# Patient Record
Sex: Male | Born: 1945 | Race: White | Hispanic: No | Marital: Married | Smoking: Never smoker
Health system: Southern US, Community
[De-identification: ages and names within clinical notes are randomized; demographics above are authoritative.]

## PROBLEM LIST (undated history)

## (undated) DIAGNOSIS — R600 Localized edema: Secondary | ICD-10-CM

## (undated) DIAGNOSIS — C189 Malignant neoplasm of colon, unspecified: Secondary | ICD-10-CM

## (undated) DIAGNOSIS — G4730 Sleep apnea, unspecified: Secondary | ICD-10-CM

## (undated) DIAGNOSIS — Z9889 Other specified postprocedural states: Secondary | ICD-10-CM

## (undated) DIAGNOSIS — Z87442 Personal history of urinary calculi: Secondary | ICD-10-CM

## (undated) DIAGNOSIS — E785 Hyperlipidemia, unspecified: Secondary | ICD-10-CM

## (undated) DIAGNOSIS — R351 Nocturia: Principal | ICD-10-CM

## (undated) DIAGNOSIS — T8859XA Other complications of anesthesia, initial encounter: Secondary | ICD-10-CM

## (undated) DIAGNOSIS — G629 Polyneuropathy, unspecified: Secondary | ICD-10-CM

## (undated) DIAGNOSIS — R002 Palpitations: Secondary | ICD-10-CM

## (undated) DIAGNOSIS — T4145XA Adverse effect of unspecified anesthetic, initial encounter: Secondary | ICD-10-CM

## (undated) DIAGNOSIS — I10 Essential (primary) hypertension: Secondary | ICD-10-CM

## (undated) DIAGNOSIS — M1990 Unspecified osteoarthritis, unspecified site: Secondary | ICD-10-CM

## (undated) DIAGNOSIS — F419 Anxiety disorder, unspecified: Secondary | ICD-10-CM

## (undated) DIAGNOSIS — N401 Enlarged prostate with lower urinary tract symptoms: Secondary | ICD-10-CM

## (undated) HISTORY — DX: Sleep apnea, unspecified: G47.30

## (undated) HISTORY — DX: Other specified postprocedural states: Z98.890

## (undated) HISTORY — DX: Malignant neoplasm of colon, unspecified: C18.9

## (undated) HISTORY — PX: COLONOSCOPY: SHX174

## (undated) HISTORY — PX: JOINT REPLACEMENT: SHX530

## (undated) HISTORY — PX: TRANSURETHRAL RESECTION OF PROSTATE: SHX73

## (undated) HISTORY — DX: Hyperlipidemia, unspecified: E78.5

## (undated) HISTORY — PX: COLON SURGERY: SHX602

## (undated) HISTORY — DX: Polyneuropathy, unspecified: G62.9

## (undated) HISTORY — DX: Palpitations: R00.2

## (undated) HISTORY — DX: Benign prostatic hyperplasia with lower urinary tract symptoms: N40.1

## (undated) HISTORY — DX: Anxiety disorder, unspecified: F41.9

## (undated) HISTORY — DX: Essential (primary) hypertension: I10

## (undated) HISTORY — PX: FOOT SURGERY: SHX648

## (undated) HISTORY — DX: Nocturia: R35.1

## (undated) HISTORY — PX: OTHER SURGICAL HISTORY: SHX169

---
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
## 1996-09-27 HISTORY — PX: RETINAL DETACHMENT SURGERY: SHX105

## 1997-07-04 HISTORY — PX: CARDIAC CATHETERIZATION: SHX172

## 2002-09-27 HISTORY — PX: COLON RESECTION: SHX5231

## 2003-05-10 ENCOUNTER — Encounter (INDEPENDENT_AMBULATORY_CARE_PROVIDER_SITE_OTHER): Payer: Self-pay | Admitting: Specialist

## 2003-05-10 ENCOUNTER — Ambulatory Visit (HOSPITAL_COMMUNITY): Admission: RE | Admit: 2003-05-10 | Discharge: 2003-05-10 | Payer: Self-pay | Admitting: Gastroenterology

## 2003-05-16 ENCOUNTER — Encounter: Payer: Self-pay | Admitting: Surgery

## 2003-05-16 ENCOUNTER — Encounter: Admission: RE | Admit: 2003-05-16 | Discharge: 2003-05-16 | Payer: Self-pay | Admitting: Surgery

## 2003-05-28 ENCOUNTER — Encounter: Payer: Self-pay | Admitting: Surgery

## 2003-06-04 ENCOUNTER — Inpatient Hospital Stay (HOSPITAL_COMMUNITY): Admission: RE | Admit: 2003-06-04 | Discharge: 2003-06-11 | Payer: Self-pay | Admitting: Surgery

## 2003-06-04 ENCOUNTER — Encounter: Payer: Self-pay | Admitting: Surgery

## 2003-06-04 ENCOUNTER — Encounter (INDEPENDENT_AMBULATORY_CARE_PROVIDER_SITE_OTHER): Payer: Self-pay | Admitting: Specialist

## 2003-07-01 ENCOUNTER — Ambulatory Visit (HOSPITAL_COMMUNITY): Admission: RE | Admit: 2003-07-01 | Discharge: 2003-07-01 | Payer: Self-pay | Admitting: Surgery

## 2003-07-01 ENCOUNTER — Ambulatory Visit (HOSPITAL_BASED_OUTPATIENT_CLINIC_OR_DEPARTMENT_OTHER): Admission: RE | Admit: 2003-07-01 | Discharge: 2003-07-01 | Payer: Self-pay | Admitting: Surgery

## 2003-07-01 ENCOUNTER — Encounter: Payer: Self-pay | Admitting: Surgery

## 2004-05-05 ENCOUNTER — Ambulatory Visit (HOSPITAL_COMMUNITY): Admission: RE | Admit: 2004-05-05 | Discharge: 2004-05-05 | Payer: Self-pay | Admitting: Oncology

## 2004-07-03 ENCOUNTER — Ambulatory Visit (HOSPITAL_COMMUNITY): Admission: RE | Admit: 2004-07-03 | Discharge: 2004-07-03 | Payer: Self-pay | Admitting: Surgery

## 2004-07-03 ENCOUNTER — Ambulatory Visit (HOSPITAL_BASED_OUTPATIENT_CLINIC_OR_DEPARTMENT_OTHER): Admission: RE | Admit: 2004-07-03 | Discharge: 2004-07-03 | Payer: Self-pay | Admitting: Surgery

## 2004-08-07 ENCOUNTER — Ambulatory Visit: Payer: Self-pay | Admitting: Oncology

## 2004-12-14 ENCOUNTER — Ambulatory Visit (HOSPITAL_COMMUNITY): Admission: RE | Admit: 2004-12-14 | Discharge: 2004-12-14 | Payer: Self-pay | Admitting: Oncology

## 2004-12-14 ENCOUNTER — Ambulatory Visit: Payer: Self-pay | Admitting: Oncology

## 2005-05-03 ENCOUNTER — Encounter: Admission: RE | Admit: 2005-05-03 | Discharge: 2005-05-03 | Payer: Self-pay | Admitting: Surgery

## 2005-07-08 ENCOUNTER — Ambulatory Visit: Payer: Self-pay | Admitting: Oncology

## 2005-07-19 ENCOUNTER — Ambulatory Visit (HOSPITAL_COMMUNITY): Admission: RE | Admit: 2005-07-19 | Discharge: 2005-07-19 | Payer: Self-pay | Admitting: Oncology

## 2006-01-06 ENCOUNTER — Ambulatory Visit: Payer: Self-pay | Admitting: Oncology

## 2006-01-07 LAB — CBC WITH DIFFERENTIAL/PLATELET
Basophils Absolute: 0 10*3/uL (ref 0.0–0.1)
EOS%: 2 % (ref 0.0–7.0)
Eosinophils Absolute: 0.1 10*3/uL (ref 0.0–0.5)
HGB: 15.6 g/dL (ref 13.0–17.1)
MCH: 29.4 pg (ref 28.0–33.4)
MCV: 86.2 fL (ref 81.6–98.0)
MONO%: 9.2 % (ref 0.0–13.0)
NEUT#: 2.7 10*3/uL (ref 1.5–6.5)
RBC: 5.3 10*6/uL (ref 4.20–5.71)
RDW: 13.1 % (ref 11.2–14.6)
lymph#: 1.2 10*3/uL (ref 0.9–3.3)

## 2006-01-07 LAB — COMPREHENSIVE METABOLIC PANEL
AST: 24 U/L (ref 0–37)
Albumin: 4.7 g/dL (ref 3.5–5.2)
Alkaline Phosphatase: 88 U/L (ref 39–117)
BUN: 21 mg/dL (ref 6–23)
Calcium: 9.8 mg/dL (ref 8.4–10.5)
Chloride: 102 mEq/L (ref 96–112)
Potassium: 4.1 mEq/L (ref 3.5–5.3)
Sodium: 142 mEq/L (ref 135–145)
Total Protein: 7.1 g/dL (ref 6.0–8.3)

## 2006-07-18 ENCOUNTER — Ambulatory Visit: Payer: Self-pay | Admitting: Oncology

## 2006-07-26 ENCOUNTER — Ambulatory Visit (HOSPITAL_COMMUNITY): Admission: RE | Admit: 2006-07-26 | Discharge: 2006-07-26 | Payer: Self-pay | Admitting: Oncology

## 2006-07-26 LAB — LIPID PANEL
LDL Cholesterol: 95 mg/dL (ref 0–99)
Total CHOL/HDL Ratio: 3.8 Ratio
VLDL: 39 mg/dL (ref 0–40)

## 2006-07-26 LAB — CBC WITH DIFFERENTIAL/PLATELET
BASO%: 0.3 % (ref 0.0–2.0)
Basophils Absolute: 0 10*3/uL (ref 0.0–0.1)
EOS%: 2.1 % (ref 0.0–7.0)
HCT: 45.6 % (ref 38.7–49.9)
HGB: 15.7 g/dL (ref 13.0–17.1)
LYMPH%: 28 % (ref 14.0–48.0)
MCH: 29.7 pg (ref 28.0–33.4)
MCHC: 34.5 g/dL (ref 32.0–35.9)
MCV: 86.1 fL (ref 81.6–98.0)
MONO%: 9.4 % (ref 0.0–13.0)
NEUT%: 60.2 % (ref 40.0–75.0)

## 2006-07-26 LAB — COMPREHENSIVE METABOLIC PANEL
AST: 25 U/L (ref 0–37)
Alkaline Phosphatase: 91 U/L (ref 39–117)
BUN: 16 mg/dL (ref 6–23)
Calcium: 9.3 mg/dL (ref 8.4–10.5)
Chloride: 102 mEq/L (ref 96–112)
Creatinine, Ser: 0.98 mg/dL (ref 0.40–1.50)
Total Bilirubin: 0.5 mg/dL (ref 0.3–1.2)

## 2007-01-30 ENCOUNTER — Ambulatory Visit: Payer: Self-pay | Admitting: Oncology

## 2007-02-01 LAB — COMPREHENSIVE METABOLIC PANEL
ALT: 27 U/L (ref 0–53)
Albumin: 4.4 g/dL (ref 3.5–5.2)
CO2: 26 mEq/L (ref 19–32)
Calcium: 9.8 mg/dL (ref 8.4–10.5)
Chloride: 102 mEq/L (ref 96–112)
Glucose, Bld: 153 mg/dL — ABNORMAL HIGH (ref 70–99)
Potassium: 3.9 mEq/L (ref 3.5–5.3)
Sodium: 140 mEq/L (ref 135–145)
Total Protein: 6.5 g/dL (ref 6.0–8.3)

## 2007-02-01 LAB — CBC WITH DIFFERENTIAL/PLATELET
BASO%: 0.3 % (ref 0.0–2.0)
EOS%: 2.5 % (ref 0.0–7.0)
LYMPH%: 31.5 % (ref 14.0–48.0)
MCH: 29.6 pg (ref 28.0–33.4)
MCHC: 35.2 g/dL (ref 32.0–35.9)
MCV: 84.2 fL (ref 81.6–98.0)
MONO%: 10 % (ref 0.0–13.0)
NEUT#: 1.8 10*3/uL (ref 1.5–6.5)
Platelets: 160 10*3/uL (ref 145–400)
RBC: 4.83 10*6/uL (ref 4.20–5.71)
RDW: 13.2 % (ref 11.2–14.6)

## 2007-02-01 LAB — CEA: CEA: 0.8 ng/mL (ref 0.0–5.0)

## 2007-03-09 LAB — CBC WITH DIFFERENTIAL/PLATELET
BASO%: 0.3 % (ref 0.0–2.0)
EOS%: 1.7 % (ref 0.0–7.0)
HCT: 42.7 % (ref 38.7–49.9)
MCH: 29.7 pg (ref 28.0–33.4)
MCHC: 34.9 g/dL (ref 32.0–35.9)
NEUT%: 62.7 % (ref 40.0–75.0)
RBC: 5.02 10*6/uL (ref 4.20–5.71)
RDW: 13 % (ref 11.2–14.6)
lymph#: 1.3 10*3/uL (ref 0.9–3.3)

## 2007-03-09 LAB — PSA: PSA: 1 ng/mL (ref 0.10–4.00)

## 2007-08-02 ENCOUNTER — Ambulatory Visit: Payer: Self-pay | Admitting: Oncology

## 2007-08-04 LAB — COMPREHENSIVE METABOLIC PANEL
AST: 19 U/L (ref 0–37)
Albumin: 4.3 g/dL (ref 3.5–5.2)
Alkaline Phosphatase: 79 U/L (ref 39–117)
Glucose, Bld: 115 mg/dL — ABNORMAL HIGH (ref 70–99)
Potassium: 4.2 mEq/L (ref 3.5–5.3)
Sodium: 140 mEq/L (ref 135–145)
Total Protein: 6.9 g/dL (ref 6.0–8.3)

## 2007-08-04 LAB — CBC WITH DIFFERENTIAL/PLATELET
EOS%: 1.5 % (ref 0.0–7.0)
Eosinophils Absolute: 0.1 10*3/uL (ref 0.0–0.5)
MCV: 85.4 fL (ref 81.6–98.0)
MONO%: 8 % (ref 0.0–13.0)
NEUT#: 3 10*3/uL (ref 1.5–6.5)
RBC: 4.99 10*6/uL (ref 4.20–5.71)
RDW: 12.9 % (ref 11.2–14.6)

## 2007-08-09 ENCOUNTER — Ambulatory Visit (HOSPITAL_COMMUNITY): Admission: RE | Admit: 2007-08-09 | Discharge: 2007-08-09 | Payer: Self-pay | Admitting: Oncology

## 2008-02-01 ENCOUNTER — Ambulatory Visit: Payer: Self-pay | Admitting: Oncology

## 2008-02-05 LAB — CBC WITH DIFFERENTIAL/PLATELET
BASO%: 0.3 % (ref 0.0–2.0)
EOS%: 1.7 % (ref 0.0–7.0)
HCT: 41.9 % (ref 38.7–49.9)
LYMPH%: 28.3 % (ref 14.0–48.0)
MCH: 29.6 pg (ref 28.0–33.4)
MCHC: 34.7 g/dL (ref 32.0–35.9)
MONO#: 0.6 10*3/uL (ref 0.1–0.9)
NEUT%: 56.9 % (ref 40.0–75.0)
RBC: 4.91 10*6/uL (ref 4.20–5.71)
WBC: 4.7 10*3/uL (ref 4.0–10.0)
lymph#: 1.3 10*3/uL (ref 0.9–3.3)

## 2008-02-05 LAB — COMPREHENSIVE METABOLIC PANEL
ALT: 31 U/L (ref 0–53)
AST: 21 U/L (ref 0–37)
CO2: 26 mEq/L (ref 19–32)
Chloride: 102 mEq/L (ref 96–112)
Creatinine, Ser: 1.08 mg/dL (ref 0.40–1.50)
Sodium: 138 mEq/L (ref 135–145)
Total Bilirubin: 0.6 mg/dL (ref 0.3–1.2)
Total Protein: 6.9 g/dL (ref 6.0–8.3)

## 2008-08-06 ENCOUNTER — Ambulatory Visit: Payer: Self-pay | Admitting: Oncology

## 2008-08-08 ENCOUNTER — Ambulatory Visit (HOSPITAL_COMMUNITY): Admission: RE | Admit: 2008-08-08 | Discharge: 2008-08-08 | Payer: Self-pay | Admitting: Oncology

## 2008-08-08 LAB — CBC WITH DIFFERENTIAL/PLATELET
Basophils Absolute: 0 10*3/uL (ref 0.0–0.1)
HCT: 43.9 % (ref 38.7–49.9)
HGB: 15.1 g/dL (ref 13.0–17.1)
MCH: 29.8 pg (ref 28.0–33.4)
MONO#: 0.5 10*3/uL (ref 0.1–0.9)
NEUT%: 59.8 % (ref 40.0–75.0)
lymph#: 1.5 10*3/uL (ref 0.9–3.3)

## 2008-08-08 LAB — COMPREHENSIVE METABOLIC PANEL
BUN: 22 mg/dL (ref 6–23)
CO2: 28 mEq/L (ref 19–32)
Calcium: 10.1 mg/dL (ref 8.4–10.5)
Chloride: 100 mEq/L (ref 96–112)
Creatinine, Ser: 1 mg/dL (ref 0.40–1.50)

## 2008-08-08 LAB — CEA: CEA: 0.6 ng/mL (ref 0.0–5.0)

## 2009-09-27 HISTORY — PX: ELBOW SURGERY: SHX618

## 2009-12-18 ENCOUNTER — Inpatient Hospital Stay (HOSPITAL_COMMUNITY): Admission: RE | Admit: 2009-12-18 | Discharge: 2009-12-20 | Payer: Self-pay | Admitting: Orthopedic Surgery

## 2010-02-27 ENCOUNTER — Inpatient Hospital Stay (HOSPITAL_COMMUNITY): Admission: RE | Admit: 2010-02-27 | Discharge: 2010-03-01 | Payer: Self-pay | Admitting: Orthopedic Surgery

## 2010-10-16 ENCOUNTER — Ambulatory Visit: Payer: Self-pay | Admitting: Cardiology

## 2010-10-17 ENCOUNTER — Encounter: Payer: Self-pay | Admitting: Oncology

## 2010-10-18 ENCOUNTER — Encounter: Payer: Self-pay | Admitting: Oncology

## 2010-10-20 ENCOUNTER — Ambulatory Visit: Payer: Self-pay | Admitting: Cardiology

## 2010-11-11 DIAGNOSIS — F419 Anxiety disorder, unspecified: Secondary | ICD-10-CM | POA: Insufficient documentation

## 2010-11-11 DIAGNOSIS — G4700 Insomnia, unspecified: Secondary | ICD-10-CM | POA: Insufficient documentation

## 2010-11-16 ENCOUNTER — Other Ambulatory Visit: Payer: Self-pay | Admitting: Gastroenterology

## 2010-12-14 LAB — URINALYSIS, ROUTINE W REFLEX MICROSCOPIC
Bilirubin Urine: NEGATIVE
Nitrite: NEGATIVE
Specific Gravity, Urine: 1.023 (ref 1.005–1.030)
Urobilinogen, UA: 0.2 mg/dL (ref 0.0–1.0)

## 2010-12-14 LAB — CULTURE, ROUTINE-ABSCESS

## 2010-12-14 LAB — HEPATIC FUNCTION PANEL
AST: 21 U/L (ref 0–37)
Alkaline Phosphatase: 76 U/L (ref 39–117)
Bilirubin, Direct: 0.1 mg/dL (ref 0.0–0.3)
Total Bilirubin: 0.6 mg/dL (ref 0.3–1.2)

## 2010-12-14 LAB — CBC
HCT: 40.4 % (ref 39.0–52.0)
Hemoglobin: 13.3 g/dL (ref 13.0–17.0)
MCHC: 33 g/dL (ref 30.0–36.0)
MCV: 85.9 fL (ref 78.0–100.0)
Platelets: 159 10*3/uL (ref 150–400)
RDW: 14.7 % (ref 11.5–15.5)

## 2010-12-14 LAB — DIFFERENTIAL
Basophils Absolute: 0 10*3/uL (ref 0.0–0.1)
Eosinophils Absolute: 0.1 10*3/uL (ref 0.0–0.7)
Eosinophils Relative: 1 % (ref 0–5)
Lymphocytes Relative: 21 % (ref 12–46)
Monocytes Absolute: 0.6 10*3/uL (ref 0.1–1.0)

## 2010-12-14 LAB — ANAEROBIC CULTURE

## 2010-12-14 LAB — BASIC METABOLIC PANEL
BUN: 15 mg/dL (ref 6–23)
Calcium: 9.6 mg/dL (ref 8.4–10.5)
Creatinine, Ser: 0.99 mg/dL (ref 0.4–1.5)
GFR calc non Af Amer: >60 mL/min (ref >60)
Glucose, Bld: 101 mg/dL — ABNORMAL HIGH (ref 70–99)
Sodium: 137 mEq/L (ref 135–145)

## 2010-12-14 LAB — GRAM STAIN

## 2010-12-14 LAB — PROTIME-INR: Prothrombin Time: 13.3 seconds (ref 11.6–15.2)

## 2010-12-14 LAB — URINE MICROSCOPIC-ADD ON

## 2010-12-20 LAB — DIFFERENTIAL
Eosinophils Absolute: 0.1 10*3/uL (ref 0.0–0.7)
Lymphocytes Relative: 22 % (ref 12–46)
Lymphs Abs: 1.2 10*3/uL (ref 0.7–4.0)
Monocytes Relative: 9 % (ref 3–12)
Neutro Abs: 3.6 10*3/uL (ref 1.7–7.7)
Neutrophils Relative %: 68 % (ref 43–77)

## 2010-12-20 LAB — URINALYSIS, ROUTINE W REFLEX MICROSCOPIC
Glucose, UA: NEGATIVE mg/dL
Ketones, ur: NEGATIVE mg/dL
Nitrite: NEGATIVE
Protein, ur: NEGATIVE mg/dL
pH: 6.5 (ref 5.0–8.0)

## 2010-12-20 LAB — ANAEROBIC CULTURE: Gram Stain: NONE SEEN

## 2010-12-20 LAB — CBC
Platelets: 205 10*3/uL (ref 150–400)
RBC: 4.29 MIL/uL (ref 4.22–5.81)
WBC: 5.4 10*3/uL (ref 4.0–10.5)

## 2010-12-20 LAB — BASIC METABOLIC PANEL
BUN: 19 mg/dL (ref 6–23)
Calcium: 9.8 mg/dL (ref 8.4–10.5)
Creatinine, Ser: 0.91 mg/dL (ref 0.4–1.5)
GFR calc Af Amer: >60 mL/min (ref >60)
GFR calc non Af Amer: >60 mL/min (ref >60)

## 2010-12-20 LAB — TYPE AND SCREEN
ABO/RH(D): O POS
Antibody Screen: NEGATIVE

## 2010-12-20 LAB — WOUND CULTURE

## 2010-12-20 LAB — APTT: aPTT: 30 seconds (ref 24–37)

## 2010-12-20 LAB — PROTIME-INR
INR: 1 (ref 0.00–1.49)
Prothrombin Time: 13.1 seconds (ref 11.6–15.2)

## 2011-01-07 ENCOUNTER — Other Ambulatory Visit: Payer: Self-pay | Admitting: Cardiology

## 2011-01-07 DIAGNOSIS — I10 Essential (primary) hypertension: Secondary | ICD-10-CM

## 2011-01-07 NOTE — Telephone Encounter (Signed)
Patient is out of his Crestor and needs [facility] to [facility] [place].  [facility] two times this week.

## 2011-01-08 MED ORDER — METOPROLOL SUCCINATE ER 25 MG PO TB24: ORAL_TABLET | ORAL | Status: DC

## 2011-01-08 NOTE — Telephone Encounter (Signed)
Called requesting refill on Metoprolol 

## 2011-02-12 NOTE — Op Note (Signed)
   NAME:  Robert [name], Robert [name]                       ACCOUNT NO.:  [id number]   MEDICAL RECORD NO.:  [id number]                   PATIENT TYPE:  AMB   LOCATION:  ENDO                                 FACILITY:  [facility]   PHYSICIAN:  James L. Malon Kindle., M.D.          DATE OF BIRTH:  03-30-1946   DATE OF PROCEDURE:  05/10/2003  DATE OF DISCHARGE:                                 OPERATIVE REPORT   PROCEDURE:  Colonoscopy and biopsy.   MEDICATIONS:  Fentanyl 62.5 mcg, Versed 7 mg IV.   SCOPE:  Olympus pediatric adjustable colonoscope.   INDICATIONS:  The patient with a prior history of colon cancer.  Last  colonoscopy in 2002 done in [place] and is due for followup.   DESCRIPTION OF PROCEDURE:  The procedure had been explained to the patient  and consent obtained.  The patient was placed in the left lateral decubitus  position.  The scope was inserted and advanced.  Prep was excellent.  There  were post surgical changes in the sigmoid colon.  After we passed this, we  advanced easily to the cecum.  The ileocecal valve and appendiceal orifice  were seen.  The scope was withdrawn in the distal ascending colon.  In the  distal ascending colon was a partially circumferential 4-6 cm mass which was  hard and ulcerated.  It had the appearance of a cancer.  Multiple biopsies  were obtained.  No other polyps or lesions were seen throughout the entire  colon.  Again posterior surgical changes to the sigmoid colon from his  previous surgeries.  The scope was withdrawn.  The patient tolerated the  procedure well.  He was maintained of low-flow oxygen and pulse oximetry  throughout the procedure.   ASSESSMENT:  Ascending colon cancer.   PLAN:  Will check pathology and make surgical referral.                                                James L. Malon Kindle., M.D.    Waldron Session  D:  05/10/2003  T:  05/10/2003  Job:  [id number]   cc:   Gloriajean Dell. [name], M.D.  [address]  [place]  [place]  [address]  Fax: [phone]

## 2011-02-12 NOTE — Op Note (Signed)
NAME:  Robert [name], Robert [name]                       ACCOUNT NO.:  [id number]   MEDICAL RECORD NO.:  [id number]                   PATIENT TYPE:  AMB   LOCATION:  DSC                                  FACILITY:  [facility]   PHYSICIAN:  Thornton Park. [name], M.D.             DATE OF BIRTH:  [birth year]   DATE OF PROCEDURE:  07/01/2003  DATE OF DISCHARGE:                                 OPERATIVE REPORT   CS:  #[id number]   PROCEDURE:  Placement of left subclavian Port-A-Cath.   SURGEON:  Thornton Park. [name], M.D.   ANESTHESIA:  MAC.   DESCRIPTION OF PROCEDURE:  The patient was taken to the [facility] Room #3 and placed in the Trendelenburg.  The  chest was prepped with Betadine and draped sterilely.  The area was  infiltrated with 1% lidocaine and the left subclavian vein was cannulated on  the first pass, and a wire was passed into the superior vena cava.  A  plastic port detached was used, and first the catheter was threaded into the  entrance site where the wire was, and into a pocket that was created, making  a small transverse incision.  This was then inserted through the peel-away  sheath without difficulty.  Easy blood return was obtained.  Multiple C-arm  pictures were taken to show where this catheter was, and it appeared to be  at the caval/atrial junction.  Initially it was in the atrium, as could be  seen by the movement.  It was pulled back to where there was essentially no  flutter movement.  It was then cut and then passed on the stem of the  plastic port, and then the radiopaque plastic lock device was driven over  the catheter, which was passed first about halfway on the stem, and then the  locking device snapped up in place, and was secure.  It was placed in the  pocket and secured with a single #4-0 Prolene.  There was essentially  bleeding noted.  The wounds were closed with #4-0 Vicryl subcuticularly,  subcutaneously, and with Benzoin and  Steri-Strips.  Prior to closure, I did  flush this with concentrated aqueous heparin, as it will not be  accessed for about four days.  The patient tolerated the procedure well and was taken to the recovery room  in satisfactory condition.  He will be given Vicodin for pain, and will be  followed up in my office in approximately four to six weeks.                                                Thornton Park [name], M.D.    MBM/MEDQ  D:  07/01/2003  T:  07/02/2003  Job:  [id number]   cc:  Gloriajean Dell. [name], M.D.  [address]  [place]  [place] [address]  Fax: [phone]   Carman Ching, M.D.   Gus Magrinat, M.D.

## 2011-02-12 NOTE — Op Note (Signed)
NAME:  Robert [name], Robert [name]             ACCOUNT NO.:  [id number]   MEDICAL RECORD NO.:  [id number]          PATIENT TYPE:  AMB   LOCATION:  DSC                          FACILITY:  [facility]   PHYSICIAN:  Thornton Park. 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  Now for port removal.   POSTOPERATIVE DIAGNOSIS:   OPERATION PERFORMED:  Explantation of left subclavian Port-A-Cath.   SURGEON:  Thornton Park. [name], M.D.   ANESTHESIA:   DESCRIPTION OF PROCEDURE:  Norman Piacentini is a 65 year old gentleman who  was brought to room 6 at [facility].  The chest was  prepped with Betadine and draped sterilely.  I infiltrated the area with 1%  lidocaine.  I excised an old scar, went down into the pseudocapsule.  I  removed a single Prolene holding it in place and removed the plastic port in  toto.  The port will be given to the patient.  It was then closed in layers  with 4-0 Vicryl and with 5-0 Vicryl with benzoin and Steri-Strips on the  skin.  The patient seemed to tolerate the procedure well.  He was given  Vicodin to take if needed for pain and will be seen back in the office on an  as needed basis.  Condition good.       MBM/MEDQ  D:  07/03/2004  T:  07/03/2004  Job:  [id number]   cc:   Valentino Hue. Magrinat, M.D.  [address] [facility]  [place]  [place] [address]  Fax: [phone]   Gloriajean Dell. [name], M.D.  [address]  [place]  [place] [address]  Fax: [phone]

## 2011-02-12 NOTE — Discharge Summary (Signed)
NAME:  Robert [name], Robert [name]                       ACCOUNT NO.:  [id number]   MEDICAL RECORD NO.:  [id number]                   PATIENT TYPE:  INP   LOCATION:  0448                                 FACILITY:  [facility]   PHYSICIAN:  Thornton Park. [name], M.D.             DATE OF BIRTH:  [birth year]   DATE OF ADMISSION:  06/04/2003  DATE OF DISCHARGE:  06/11/2003                                 DISCHARGE SUMMARY   ADMITTING DIAGNOSIS:  Carcinoma of the ascending colon.   DISCHARGE DIAGNOSIS:  Adenocarcinoma of the right colon invading through the  muscularis propria, involving 4 of 17 lymph nodes (PT3, PN2, PMX).   PROCEDURE:  Laparoscopically assisted right hemicolectomy on June 04, 2003.   COURSE IN THE HOSPITAL:  Mr. Corporan is a 65 year old gentleman with a  previous history of a low anterior resection for colon cancer and with the  development of new cancer in the ascending colon.  The patient underwent a  laparoscopically assisted right hemicolectomy on June 04, 2003, from  which he did well.  He was somewhat anxious but we worked with him and got  him up moving quickly and coughing and deep breathing.  Pathology showed  this to be a lesion with positive nodes and full-thickness and for this, he  was seen in the office by Dr. Valentino Hue. Magrinat, who has discussed  chemotherapy with him.  His hemoglobin drifted down and then came back into  the 9.5 range and he began passing gas and tolerating a diet of liquids.  He  was begun on solids on June 10, 2003, which he tolerated, and he was  ready for discharge on June 11, 2003.  Condition good.  His incision  was healing nicely.   DISCHARGE MEDICATIONS:  He will be given a prescription for Mepergan Fortis  to take if needed for pain.   FOLLOWUP:  Arrangements will be made for a Port-A-Cath to be placed later  this month prior to his anticipated beginning of chemotherapy in early  October per Dr. Darnelle Catalan.  The patient is  aware of this.   CONDITION ON DISCHARGE:  Condition improved.   IMPRESSION:  Status post laparoscopically assisted right hemicolectomy for  T3, N2, MX carcinoma of the right colon.                                               Thornton Park [name], M.D.    MBM/MEDQ  D:  06/11/2003  T:  06/11/2003  Job:  [id number]   cc:   Fayrene Fearing L. Malon Kindle., M.D.  [address]  [place]  [place] [address]  Fax: [phone]   Valentino Hue. Magrinat, M.D.  [address] [facility]  [place]  [place] [address]  Fax: [phone]  Gloriajean Dell. [name], M.D.  [address]  [place]  [place] [address]  Fax: [phone]

## 2011-02-12 NOTE — H&P (Signed)
NAME:  Robert, [name]                       ACCOUNT NO.:  [id number]   MEDICAL RECORD NO.:  [id number]                   PATIENT TYPE:  INP   LOCATION:  0448                                 FACILITY:  [facility]   PHYSICIAN:  Valentino Hue. Magrinat, M.D.            DATE OF BIRTH:  06-19-1946   DATE OF ADMISSION:  06/04/2003  DATE OF DISCHARGE:                                HISTORY & PHYSICAL   HISTORY OF PRESENT ILLNESS:  Robert [name] is a 65 year old [place]  man with a new diagnosis of colon cancer referred by Thornton Park. Robert [name], M.D.  for further evaluation and treatment.   The patient had a prior partial sigmoidectomy for removal of several polyps,  one or two of which apparently included stage I colon carcinoma.  He then  had a repeat colonoscopy in December 2002 and was scheduled for repeat  colonoscopy on December 2003 because in the last colonoscopy what was felt  to be a hyperplastic lesion really was thought to need further monitoring.  However, he neglected to follow up and did not have his colonoscopy until  August 13 under James L. Randa Evens, M.D. which showed [phone]) an invasive  carcinoma associated with prominent desmoplastic response (I do not have the  original colonoscopy report).   With this information the patient was referred to [facility]. Robert [name], M.D.  and after appropriate surgery he underwent right segmental resection  June 04, 2003 for what proved to be a grade 2 colon adenocarcinoma  invading through the muscularis and involving 4 out of 17 lymph nodes  sampled (PT3 PN2 CMO stage IIIC).  There was no evidence of carcinomatosis  by inspection or of liver involvement by palpation or CT scan obtained prior  to surgery.  The patient's preoperative CEA was uninformative at 1.3.   With this information the patient is referred for further evaluation and  treatment.   PAST MEDICAL HISTORY:  1. Renal stones.  2. Left renal cyst.  3. History of work  related stress.  4. Status post appendectomy.  5. Status post tonsillectomy and adenoidectomy.  6. Status post TURP (I do not have full details).  7. Hypercholesterolemia.  8. Hypertension.  9. History of palpitations.  10.      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  11.      History of intraocular implants.   FAMILY HISTORY:  The patient's father died from a heart attack at age 19.  The patient's mother had her colon removed for diverticular disease at age  50 and died three years after that.  The patient has one brother and two  sisters all of whom have had colonoscopies.  There is no other family  history of colon carcinoma.   SOCIAL HISTORY:  He is a Financial controller for [employer].  He has been married 14  years to Morton who works part-time for the [employer].  They  have  a total of six children, one a son of his from an earlier marriage,  Robert [name], who is in [place] working as a high school Retail buyer and plans  to marry a [place] woman.  He has a degree in Administrator, sports.  Robert [name] has three children from an earlier marriage, [name], 24, [name] 20,  and [name] 19.  [name] and [name] are in college.  Robert [name] and Maleik are the  parents of Robert [name] 13 and Robert [name] 11, both at home.   HEALTH MAINTENANCE:  As noted, the patient had a most recently colonoscopy  August of this year.  He gets his health maintenance through [employer]. [name],  M.D.  The patient smoked minimally in high school.  He never abused alcohol,  although he has a wine glass at supper most nights.  He has never had a flu  shot or Pneumovax and, indeed, he is too young for that at present.  He has  a living will in place and he thinks one of his sisters is his health care  power of attorney but he is not sure.   ALLERGIES:  No known drug allergies.   MEDICATIONS:  1. BuSpar.  2. Toprol.  3. Lipitor.   REVIEW OF SYSTEMS:  He is recovering well from the surgery.  He is very  responsive to  anesthesia, he says, which means that he cannot urinate after  anesthesia which is the reason he has the Foley still in place.  He is still  using the PCA, of course.  Despite this he is able to follow the discussion  quite well today, although he does get somewhat distracted at times.  He  denies any nausea or vomiting.  Of course, he is not having bowel movement  at present.  He denies significant pain and there has been no bleeding.   PHYSICAL EXAMINATION:  VITAL SIGNS:  Current temperature 100.2, pulse 88,  respiratory rate 18, blood pressure 132/58.  He weighed 180 pounds on  admission.  His height was listed at 5 feet 9 inches, although I do not know  if he was actually measured.  LYMPH:  I do not palpate any peripheral adenopathy.  LUNGS:  No crackles or wheezes.  HEART:  Regular rate and rhythm.  No murmurs appreciated.  ABDOMEN:  Status post recent surgery and I do not auscultate bowel sounds.  There is no peripheral adenopathy.   LABORATORIES:  This admission showed white cell count 9.2, hemoglobin 12.3,  platelets 169,000 with an MCV of 84.5.  The creatinine was 1.0, BUN 8.  The  liver panel on admission showed a total protein of 6.7, albumin 3.9, AST 21,  ALT 25, alkaline phosphatase 78, total bilirubin 0.5.  The CEA on August 31  was 1.3.  Had films.  The patient had a chest x-ray which shows no evidence  of metastatic disease and a CT scan of the abdomen and pelvis on May 16, 2003 which showed no evidence of hepatic disease, no evidence of ascites,  and several renal cysts including a dominant one measuring nearly 6 cm in  the left kidney.   IMPRESSION AND PLAN:  A 65 year old [place] man status post right  hemicolectomy June 04, 2003 for a T3 N2 M0 grade 2 colon adenocarcinoma  with 4 of 17 lymph nodes involved with an uninformative preoperative CEA at  1.3.  We had a long discussion regarding colon cancer and stage III colon cancer  in particular.  I  quoted him an 80% or so chance of disease recurrence  despite his excellent surgery.  He may be able to cut that risk in half to  an ultimate 40% recurrent risk or 60% chance of cure by using Xeloda,  oxaliplatin, and Avastin as I outlined for him.  He will need intravenous  treatments every two weeks x2 so I think he would benefit greatly from a  port.   He may be interested in second or even third opinions and I told him this  would not be a problem and I would be glad to help him set this up if he  wishes to pursue that.  I will still be glad to be his physician in any way  that he would desire.   I will be out of town tomorrow and will be back here on the 13th.  If he is  still in the hospital, I will see him then.  Otherwise, I have asked him to  call my office for an appointment the last week in September since we cannot  start the Avastin at least 28 days postoperatively.   I appreciate consulting with you on this case.                                               Valentino Hue. Magrinat, M.D.    Ronna Polio  D:  06/06/2003  T:  06/07/2003  Job:  [id number]   cc:   Fayrene Fearing L. Malon Kindle., M.D.  [address]  [place]  [place] [address]  Fax: [phone]   Gloriajean Dell. [name], M.D.  [address]  [place]  [place] [address]  Fax: [phone]

## 2011-02-12 NOTE — Op Note (Signed)
NAME:  DEMONTREZ, RINDFLEISCH                       ACCOUNT NO.:  [id number]   MEDICAL RECORD NO.:  [id number]                   PATIENT TYPE:  INP   LOCATION:  0448                                 FACILITY[facility]  [facility]   PHYSICIAN:  Thornton Park. [name], M.D.             DATE OF BIRTH:  09-Jan-1946   DATE OF PROCEDURE:  06/04/2003  DATE OF DISCHARGE:                                 OPERATIVE REPORT   PREOPERATIVE DIAGNOSIS:  Right colon cancer.   POSTOPERATIVE DIAGNOSIS:  Adenocarcinoma of the right colon, status post  right hemicolectomy.   OPERATION/PROCEDURE:  Laparoscopically assisted right hemicolectomy.   SURGEON:  Thornton Park. [name], M.D.   ASSISTANT:  1. Vikki Ports, M.D.  2. Sandria Bales. Ezzard Standing, M.D.   ANESTHESIA:  General endotracheal anesthesia.   DESCRIPTION OF PROCEDURE:  Mr. Kitagawa is a 65 year old gentleman recently  found to have an adenocarcinoma in the ascending colon.  Informed consent  was obtained regarding partial colectomy.  He was taken to OR #10 and given  general anesthesia.  Preoperatively he got 1 g of Cefotan after a bowel  prep.  The operation was begun after prepping with Betadine and draping  widely using the 5 mm Optiview and a 5 mm scope, going in the left upper  quadrant.  This was inserted without difficulty and then the abdomen was  insufflated easily.  A second 5 mm scope was placed on the right side at the  lateral margin of where my transverse incision would come the lie.  A 5 mm  was also placed in the upper midline.  Through these three ports, the 5 mm 0-  degree scope was used and using the Harmonic scalpel, I took down many  adhesions from his previous midline incision going all the way down toward  the pelvis.  These were all freed with the laparoscope the mobilize omentum  which was stuck to the incision.   Next, the patient was rolled with his right side up and with his head down.  I identified the cecum and found where he had had  previous appendectomy and  things were stuck in that area.  Using a Glassman clamp I proceeded to  mobilize the right colon using the Harmonic scalpel.  The terminal ileum was  stuck down along the right gutter and I stayed and grasped the bowel and  stayed along it and freed it as it went down into the pelvis.  This gave it  the mobility to come up into the right-sided incision subsequently.  I then  mobilized the ascending colon again along the line of Toldt, taking down  these peritoneal reflection and then sweeping the colon away from the right  colon wall.  The right upper quadrant was similarly mobilized including the  hepatic flexure and using the scope, looking down, I was able to see the  duodenum and sweep the colon medial.  I divided  portions of the omentum to  mobilize the transverse colon.  This went smoothly and it was freed in toto.   Next, I made about a 9 cm incision using the right-sided port and using the  laparoscopic wound retractor which I isolated the wound nicely.  I was able  to then bring the small bowel through the transverse colon up onto the  abdomen to exteriorize this.   The bowel was divided proximally and distally with the GIA and then  I went  through the mesentery with Kelly clamps, the Harmonic scalpel, and suture  ligatures on the staying side of the mesentery.  Functional end-to-end  anastomosis was then constructed using the GIA along the antimesenteric  border of the small bowel and the antimesenteric border of the transverse  colon which lay along the tinea.  A good anastomosis was created and the  common opening was then closed in two layers with 4-0 PDS with interrupted 3-  0 silk.  The mesentery was approximated with 3-0 silk.  This was then  returned to the abdomen.  There was some oozing in the right lower quadrant  which I took some time to control using the Harmonic scalpel and the Bovie.  The rest of the abdomen was thoroughly irrigated and  inspected and no other  areas of oozing or bleeding were noted.  The anastomosis appeared healthy.  The remaining portion of the abdomen looked fine.  The trocars had been  previously withdrawn and I elected not to return those into the abdomen.  I  felt the liver and did not see or feel anything in the liver and the  gallbladder appeared normal.  The stomach looked fine.  The wound was then  closed with #1 PDS and the posterior sheath.  This was injected with some  Marcaine earlier and.  The anterior sheath was closed with running #1 PDS  and was irrigated and closed with staples.  The patient seemed to tolerate  the procedure well.  He was taken to the recovery room in satisfactory  condition.                                               Thornton Park [name], M.D.    MBM/MEDQ  D:  06/04/2003  T:  06/04/2003  Job:  [id number]   cc:   Gloriajean Dell. [name], M.D.  [address]  [place]  [place] [address]  Fax: [phone]   Llana Aliment. Malon Kindle., M.D.  [address]  [place]  [place] [address]  Fax: [phone]

## 2011-02-16 ENCOUNTER — Telehealth: Payer: Self-pay | Admitting: Cardiology

## 2011-02-16 DIAGNOSIS — I10 Essential (primary) hypertension: Secondary | ICD-10-CM

## 2011-02-16 DIAGNOSIS — E878 Other disorders of electrolyte and fluid balance, not elsewhere classified: Secondary | ICD-10-CM

## 2011-02-16 NOTE — Telephone Encounter (Signed)
DO YOU NEED HIM TO COME IN EARLY FOR LABS, AND HE IS COMING UP ON 3 MONTHS ON CRESTOR. DOES HE NEED TO COME IN EARLY BECAUSE OF IT.

## 2011-02-18 NOTE — Telephone Encounter (Signed)
Called to schedule labs prior to office visit

## 2011-04-03 ENCOUNTER — Other Ambulatory Visit: Payer: Self-pay | Admitting: Cardiology

## 2011-04-05 NOTE — Telephone Encounter (Signed)
escribe medication per fax request  

## 2011-04-06 ENCOUNTER — Other Ambulatory Visit: Payer: Self-pay | Admitting: *Deleted

## 2011-04-19 ENCOUNTER — Telehealth: Payer: Self-pay | Admitting: Cardiology

## 2011-04-19 ENCOUNTER — Other Ambulatory Visit: Payer: Self-pay | Admitting: *Deleted

## 2011-04-19 NOTE — Telephone Encounter (Signed)
PATIENT WANTS TO MAKE SURE THAT [facility]

## 2011-04-19 NOTE — Telephone Encounter (Signed)
Called wanting to be sure we had received labs from Dr. Clelia Croft from June. They have been scanned in epic. Cancelled his app for today for labs. Has an app 7/25 w/Dr. [place]

## 2011-04-20 ENCOUNTER — Encounter: Payer: Self-pay | Admitting: Cardiology

## 2011-04-21 ENCOUNTER — Ambulatory Visit (INDEPENDENT_AMBULATORY_CARE_PROVIDER_SITE_OTHER): Payer: Self-pay | Admitting: Cardiology

## 2011-04-21 ENCOUNTER — Encounter: Payer: Self-pay | Admitting: Cardiology

## 2011-04-21 DIAGNOSIS — I10 Essential (primary) hypertension: Secondary | ICD-10-CM | POA: Insufficient documentation

## 2011-04-21 DIAGNOSIS — E785 Hyperlipidemia, unspecified: Secondary | ICD-10-CM | POA: Insufficient documentation

## 2011-04-21 NOTE — Assessment & Plan Note (Signed)
Blood pressure is very well controlled on his current medications and he is tolerating them well. I congratulated him on his lifestyle modifications. I will plan to follow up again in one year.

## 2011-04-21 NOTE — Patient Instructions (Signed)
Continue your current medications and healthy lifestyle.  I will see you again in 1 year.

## 2011-04-21 NOTE — Assessment & Plan Note (Signed)
His lipids have improved on Crestor and he is at target now.

## 2011-04-21 NOTE — Progress Notes (Signed)
   Robert [name] Date of Birth: October 23, 1945   History of Present Illness: Robert [name] is seen today for followup. He has established a new primary care doctor with Dr. Eric Form. He reports that he has been feeling very well. He is following a healthy diet and exercising regularly. He has lost 7 pounds. His recent lipid panel had improved significantly and he attributes this to the fact that he was switched from generic Lipitor to Crestor. He denies any shortness of breath or chest pain.  Current Outpatient Prescriptions on File Prior to Visit  Medication Sig Dispense Refill  . aspirin 81 MG tablet Take 81 mg by mouth daily.        Marland Kitchen b complex vitamins tablet Take 1 tablet by mouth 2 (two) times daily.       Marland Kitchen diltiazem (CARDIZEM CD) 240 MG 24 hr capsule Take 240 mg by mouth daily.        . fish oil-omega-3 fatty acids 1000 MG capsule Take 1,200 mg by mouth daily.        Marland Kitchen glucosamine-chondroitin 500-400 MG tablet Take 1 tablet by mouth 2 (two) times daily.        Marland Kitchen lisinopril (PRINIVIL,ZESTRIL) 20 MG tablet Take 20 mg by mouth daily.        Marland Kitchen lisinopril-hydrochlorothiazide (PRINZIDE,ZESTORETIC) 20-12.5 MG per tablet TAKE 1 TABLET BY MOUTH EVERY DAY  30 tablet  5  . Multiple Vitamin (MULTIVITAMIN) tablet Take 1 tablet by mouth daily.        . rosuvastatin (CRESTOR) 10 MG tablet Take 10 mg by mouth daily.        Marland Kitchen DISCONTD: metoprolol succinate (TOPROL XL) 25 MG 24 hr tablet Take 1 1/2 tablet in AM; and 1/2 tablet in PM  60 tablet  5    No Known Allergies  Past Medical History  Diagnosis Date  . Hypertension   . Hyperlipidemia   . Colon cancer     Past Surgical History  Procedure Date  . Cardiac catheterization 07/04/1997  . Colon resection 2004  . L4 discectomy   . Elbow surgery   . Transurethral resection of prostate     History  Smoking status  . Never Smoker   Smokeless tobacco  . Not on file    History  Alcohol Use No    Family History  Problem Relation Age of  Onset  . Emphysema Mother   . Heart attack Father     Review of Systems: All other systems are reviewed and are negative. Physical Exam: BP 102/68  Pulse 56  Ht 5\' 9"  (1.753 m)  Wt 168 lb (76.204 kg)  BMI 24.81 kg/m2 He is a pleasant white male in no acute distress. His HEENT exam is unremarkable. He has no JVD or bruits. Lungs are clear. Cardiac exam reveals a regular rate and rhythm without gallop, murmur, or click. Abdomen is soft and nontender. He has no edema. Pedal pulses are good. LABORATORY DATA: Dated March 09, 2011 his total cholesterol is 167, triglycerides 124, HDL 46, LDL 96. Chemistry panel was normal.  Assessment / Plan:

## 2011-04-23 ENCOUNTER — Other Ambulatory Visit: Payer: Self-pay | Admitting: Cardiology

## 2011-04-26 NOTE — Telephone Encounter (Signed)
escribe medication per fax request  

## 2011-04-30 ENCOUNTER — Other Ambulatory Visit: Payer: Self-pay | Admitting: Cardiology

## 2011-05-03 NOTE — Telephone Encounter (Signed)
escribe medication per fax request  

## 2011-06-14 ENCOUNTER — Other Ambulatory Visit: Payer: Self-pay | Admitting: Cardiology

## 2011-06-15 NOTE — Telephone Encounter (Signed)
escribe medication per fax request  

## 2011-09-09 ENCOUNTER — Other Ambulatory Visit: Payer: Self-pay | Admitting: Cardiology

## 2011-10-04 ENCOUNTER — Other Ambulatory Visit: Payer: Self-pay | Admitting: *Deleted

## 2011-10-04 MED ORDER — LISINOPRIL-HYDROCHLOROTHIAZIDE 20-12.5 MG PO TABS: 1.0000 | ORAL_TABLET | Freq: Every day | ORAL | Status: DC

## 2011-10-29 ENCOUNTER — Other Ambulatory Visit: Payer: Self-pay

## 2011-10-29 MED ORDER — ROSUVASTATIN CALCIUM 10 MG PO TABS: 10.0000 mg | ORAL_TABLET | Freq: Every day | ORAL | Status: DC

## 2011-11-10 ENCOUNTER — Telehealth: Payer: Self-pay | Admitting: Cardiology

## 2011-11-10 NOTE — Telephone Encounter (Signed)
New Msg: Pt calling wanting to speak with nurse regarding pt BP readings. Please return pt call to discuss further.

## 2011-11-10 NOTE — Telephone Encounter (Signed)
I think he just needs to get back on his usual prescriptions and monitor HR and BP over the next 2 weeks. Call if he still has problems. Zennie Ayars Sw[place]and MD, [facility]

## 2011-11-10 NOTE — Telephone Encounter (Signed)
Spoke with pt.  He reports heart rate has been elevated in evening for the past 6 days or so.  Heart rate OK during the day. Readings as below at around 9:30 each evening:   2/9--95, 138/67   2/10--87   2/11--88   2/13--113, 123/63 He has been taking lorazepam in the evening the last few days and this seems to help his anxiety associated with elevated heart rate. This is prescribed by primary MD.  Last night after taking his heart rate went down to 60-70.  When heart rate was 113 last night he felt "like being on a treadmill".  No shortness of breath, chest pain or fever.  He feels like heart rate is regular.  Also states he was out of [place]rol [place] beach [place] and got it refilled at a pharmacy there.  He was wondering if the medication he received was different generic Toprol than he was used to so he contacted his pharmacy here in town and they refilled Toprol for him earlier this week.  He takes Toprol 37. 5 mg in AM and recently started taking 12. 5 mg in the PM. He takes Lisinopril/HCT combination in AM and Cardizem and Lisinopril in PM.   This AM heart rate is 51 and blood pressure 119/61. He is going to fax all readings to our office. I told pt I would forward information to Dr. Swaziland and we would call him back with his recommendations.

## 2011-11-11 NOTE — Telephone Encounter (Signed)
Patient called was told Dr. Swaziland advises to get back on usual prescriptions,and monitor heart rate and blood pressure for the next 2 weeks.Advised to call back if continues to have problems.

## 2011-11-11 NOTE — Telephone Encounter (Signed)
Patient called no answer.LMTC. 

## 2011-12-08 ENCOUNTER — Ambulatory Visit (INDEPENDENT_AMBULATORY_CARE_PROVIDER_SITE_OTHER): Payer: BC Managed Care – PPO | Admitting: Nurse Practitioner

## 2011-12-08 ENCOUNTER — Encounter: Payer: Self-pay | Admitting: Nurse Practitioner

## 2011-12-08 VITALS — BP 108/62 | HR 50 | Ht 69.0 in | Wt 164.0 lb

## 2011-12-08 DIAGNOSIS — R000 Tachycardia, unspecified: Secondary | ICD-10-CM

## 2011-12-08 DIAGNOSIS — I10 Essential (primary) hypertension: Secondary | ICD-10-CM

## 2011-12-08 LAB — BASIC METABOLIC PANEL
BUN: 23 mg/dL (ref 6–23)
CO2: 28 mEq/L (ref 19–32)
Calcium: 10.2 mg/dL (ref 8.4–10.5)
Chloride: 99 mEq/L (ref 96–112)
Creatinine, Ser: 1 mg/dL (ref 0.4–1.5)
GFR: 80.68 mL/min (ref >60.00)
Glucose, Bld: 91 mg/dL (ref 70–99)
Potassium: 4.2 mEq/L (ref 3.5–5.1)
Sodium: 134 mEq/L — ABNORMAL LOW (ref 135–145)

## 2011-12-08 LAB — TSH: TSH: 1.13 u[IU]/mL (ref 0.35–5.50)

## 2011-12-08 MED ORDER — METOPROLOL SUCCINATE ER 25 MG PO TB24: 25.0000 mg | ORAL_TABLET | Freq: Two times a day (BID) | ORAL | Status: DC

## 2011-12-08 NOTE — Progress Notes (Signed)
Robert [name] Date of Birth: 1946/08/21 Medical Record #[id number]  History of Present Illness: Robert [name] is seen today for a work in visit. He is seen for Dr. Swaziland. He is a 66 year old male with no known CAD. He does have HTN and HLD. He has issues with chronic anxiety and uses Ativan prn. His PCP is Dr. Eric Form.  He comes in today. He had called last month because he was having more elevated heart rates in the evening. He notes that when his heart rate is about 80 that he feels "like I'm on a treadmill". He says it is pounding. He treats it with Ativan. This is mainly in the evening. He does have a martini and a glass of wine each evening. He says he is having nightmares at night. Does not eat before bedtime. He has chronic nocturia x 4 due to his prostate issues. No chest pain. No recent stress testing. Last cath in 1998 reportedly normal following abnormal stress testing. He is currently taking Toprol 37.5 mg in the am and his Diltiazem 240 mg in the evening.   Current Outpatient Prescriptions on File Prior to Visit  Medication Sig Dispense Refill  . aspirin 81 MG tablet Take 81 mg by mouth daily.        Marland Kitchen b complex vitamins tablet Take 1 tablet by mouth 2 (two) times daily.       . fish oil-omega-3 fatty acids 1000 MG capsule Take 1,200 mg by mouth daily.        Marland Kitchen glucosamine-chondroitin 500-400 MG tablet Take 1 tablet by mouth 2 (two) times daily.        Marland Kitchen lisinopril (PRINIVIL,ZESTRIL) 20 MG tablet TAKE 1 TABLET BY MOUTH EVERY DAY (TAKE WITH LISINOPRIL/HCTZ)  30 tablet  5  . lisinopril-hydrochlorothiazide (PRINZIDE,ZESTORETIC) 20-12.5 MG per tablet Take 1 tablet by mouth daily.  30 tablet  5  . Multiple Vitamin (MULTIVITAMIN) tablet Take 1 tablet by mouth daily.        . rosuvastatin (CRESTOR) 10 MG tablet Take 1 tablet (10 mg total) by mouth daily.  30 tablet  5  . zaleplon (SONATA) 10 MG capsule Take 10 mg by mouth as needed.       Marland Kitchen DISCONTD: diltiazem (CARDIZEM CD) 240 MG 24  hr capsule TAKE 1 CAPSULE BY MOUTH EVERY DAY  30 capsule  5  . DISCONTD: metoprolol succinate (TOPROL-XL) 25 MG 24 hr tablet TAKE 1 & 1/2 TABLETS BY MOUTH EVERY MORNING AND 1/2 TABLET EVERY EVENING  90 tablet  5  . DISCONTD: metoprolol succinate (TOPROL-XL) 25 MG 24 hr tablet Take 1 1/2 tablet in AM         No Known Allergies  Past Medical History  Diagnosis Date  . Hypertension   . Hyperlipidemia   . Colon cancer     s/p colon resection in 2004  . S/P cardiac catheterization 1995 and 1998    following abnormal stress tests. Both reportedly normal.     Past Surgical History  Procedure Date  . Cardiac catheterization 07/04/1997  . Colon resection 2004  . L4 discectomy   . Elbow surgery   . Transurethral resection of prostate     History  Smoking status  . Never Smoker   Smokeless tobacco  . Not on file    History  Alcohol Use  . 1.1 oz/week  . 1 Glasses of wine, 1 Drinks containing 0.5 oz of alcohol per week    each  night    Family History  Problem Relation Age of Onset  . Emphysema Mother   . Heart attack Father     Review of Systems: The review of systems is positive for tachycardia in the evening.  All other systems were reviewed and are negative.  Physical Exam: BP 108/62  Pulse 50  Ht 5\' 9"  (1.753 m)  Wt 164 lb (74.39 kg)  BMI 24.22 kg/m2 Patient is somewhat anxious but in no acute distress. Skin is warm and dry. Color is normal.  HEENT is unremarkable. Normocephalic/atraumatic. PERRL. Sclera are nonicteric. Neck is supple. No masses. No JVD. Lungs are clear. Cardiac exam shows a regular rate and rhythm. Abdomen is soft. Extremities are without edema. Gait and ROM are intact. No gross neurologic deficits noted.  LABORATORY DATA: EKG today shows sinus bradycardia. Rate is 50.    Assessment / Plan:

## 2011-12-08 NOTE — Patient Instructions (Signed)
We are going to change the Toprol to 25 mg to times a day.  Dr. Swaziland will see you back in a month.

## 2011-12-08 NOTE — Assessment & Plan Note (Signed)
He is having more tachycardia and is symptomatic in the evenings. I think his anxiety makes this worse. I have changed his Toprol to 25 mg BID. We are functionally adding an additional 12.5 mg.  I have asked him to cut back on his alcohol. We will check a BMET and a TSH today. He says he has had no recent labs. We will see him back in a month. He will continue to monitor his blood pressure and heart rate at home. May need further medication adjustment. Patient is agreeable to this plan and will call if any problems develop in the interim.

## 2011-12-08 NOTE — Assessment & Plan Note (Signed)
Blood pressure looks good on his current regimen. 

## 2011-12-13 ENCOUNTER — Other Ambulatory Visit: Payer: Self-pay

## 2011-12-13 MED ORDER — LISINOPRIL 20 MG PO TABS: 20.0000 mg | ORAL_TABLET | Freq: Every day | ORAL | Status: DC

## 2011-12-15 ENCOUNTER — Other Ambulatory Visit: Payer: Self-pay | Admitting: Cardiology

## 2011-12-15 MED ORDER — DILTIAZEM HCL ER COATED BEADS 240 MG PO CP24: 240.0000 mg | ORAL_CAPSULE | Freq: Every day | ORAL | Status: DC

## 2011-12-15 NOTE — Telephone Encounter (Signed)
He has only two pills left and he is going out of town

## 2012-01-12 ENCOUNTER — Ambulatory Visit (INDEPENDENT_AMBULATORY_CARE_PROVIDER_SITE_OTHER): Payer: BC Managed Care – PPO | Admitting: Cardiology

## 2012-01-12 ENCOUNTER — Encounter: Payer: Self-pay | Admitting: Cardiology

## 2012-01-12 VITALS — BP 122/70 | HR 58 | Ht 69.0 in | Wt 168.0 lb

## 2012-01-12 DIAGNOSIS — I10 Essential (primary) hypertension: Secondary | ICD-10-CM

## 2012-01-12 DIAGNOSIS — R000 Tachycardia, unspecified: Secondary | ICD-10-CM

## 2012-01-12 NOTE — Assessment & Plan Note (Signed)
Blood pressure is under excellent control. In fact I wonder if his elevated heart rate isn't related to periods of low blood pressure. I recommended stopping his evening dose of lisinopril. We will continue his other medications as prescribed. He is going to monitor his blood pressure very closely and call us if it continues to be a problem. Otherwise I'll see him back again in 6 months.

## 2012-01-12 NOTE — Progress Notes (Signed)
Robert [name] Date of Birth: 10/05/1945   History of Present Illness: Robert [name] is seen today for followup. He was seen last month with complaints of intermittent increased heart rate. His Toprol was changed to 25 mg twice a day. This then his symptoms have improved. He still has occasional periods where his heart rate would jump up. Often this is associated with periods of low blood pressure. His resting heart rate is typically 60 but at times of increased heart rate it may be as high as 80-100. He otherwise feels well.  Current Outpatient Prescriptions on File Prior to Visit  Medication Sig Dispense Refill  . aspirin 81 MG tablet Take 81 mg by mouth daily.        Marland Kitchen b complex vitamins tablet Take 1 tablet by mouth 2 (two) times daily.       Marland Kitchen diltiazem (CARDIZEM CD) 240 MG 24 hr capsule Take 1 capsule (240 mg total) by mouth daily.  30 capsule  6  . fish oil-omega-3 fatty acids 1000 MG capsule Take 1,200 mg by mouth daily.        Marland Kitchen glucosamine-chondroitin 500-400 MG tablet Take 1 tablet by mouth 2 (two) times daily.        Marland Kitchen lisinopril (PRINIVIL,ZESTRIL) 20 MG tablet Take 1 tablet (20 mg total) by mouth daily.  30 tablet  1  . lisinopril-hydrochlorothiazide (PRINZIDE,ZESTORETIC) 20-12.5 MG per tablet Take 1 tablet by mouth daily.  30 tablet  5  . Multiple Vitamin (MULTIVITAMIN) tablet Take 1 tablet by mouth daily.        . rosuvastatin (CRESTOR) 10 MG tablet Take 1 tablet (10 mg total) by mouth daily.  30 tablet  5  . zaleplon (SONATA) 10 MG capsule Take 10 mg by mouth as needed.       Marland Kitchen DISCONTD: metoprolol succinate (TOPROL-XL) 25 MG 24 hr tablet Take 1 tablet (25 mg total) by mouth 2 (two) times daily. Taking 1 1/2 tablets (37.5 mg each am)  60 tablet  6  . DISCONTD: hyoscyamine (LEVSIN SL) 0.125 MG SL tablet         No Known Allergies  Past Medical History  Diagnosis Date  . Hypertension   . Hyperlipidemia   . Colon cancer     s/p colon resection in 2004  . S/P cardiac  catheterization 1995 and 1998    following abnormal stress tests. Both reportedly normal.   . Anxiety     Past Surgical History  Procedure Date  . Cardiac catheterization 07/04/1997  . Colon resection 2004  . L4 discectomy   . Elbow surgery   . Transurethral resection of prostate     History  Smoking status  . Never Smoker   Smokeless tobacco  . Not on file    History  Alcohol Use  . 1.1 oz/week  . 1 Glasses of wine, 1 Drinks containing 0.5 oz of alcohol per week    each night    Family History  Problem Relation Age of Onset  . Emphysema Mother   . Heart attack Father     Review of Systems: All other systems are reviewed and are negative. Physical Exam: BP 122/70  Pulse 58  Ht 5\' 9"  (1.753 m)  Wt 168 lb (76.204 kg)  BMI 24.81 kg/m2 He is a pleasant white male in no acute distress. His HEENT exam is unremarkable. He has no JVD or bruits. Lungs are clear. Cardiac exam reveals a regular rate and rhythm without gallop,  murmur, or click. Abdomen is soft and nontender. He has no edema. Pedal pulses are good. LABORATORY DATA:   Assessment / Plan:

## 2012-01-12 NOTE — Patient Instructions (Signed)
Stop the evening lisinopril.   Continue the other medications.  I will see you again in 6 months

## 2012-01-25 ENCOUNTER — Telehealth: Payer: Self-pay | Admitting: Cardiology

## 2012-01-25 NOTE — Telephone Encounter (Signed)
Pt's med changed is not working like he thought it would , pls call [phone]

## 2012-01-25 NOTE — Telephone Encounter (Signed)
PT CONT TO HAVE ELEVATED HEART RATE IN PM AS WELL AS  B/P CREAPING BACK UP SINCE LAST MED CHANGE  PER PT IS TAKING TOPROL  50 MG AND PM  LISINOPRIL 20 MG WAS STOPPED.PT AWARE WILL FORWARD TO DR Swaziland FOR REVIEW .Zack Seal

## 2012-01-26 MED ORDER — LISINOPRIL 10 MG PO TABS: 10.0000 mg | ORAL_TABLET | Freq: Every day | ORAL | Status: DC

## 2012-01-26 NOTE — Telephone Encounter (Signed)
He appears to be well beta blocked on current Toprol dose. If blood pressure is trending up he may want to resume Lisinopril in the evening but start with a lower dose of 10 mg. Suprina Mandeville [place] MD, [facility]

## 2012-01-26 NOTE — Telephone Encounter (Signed)
Patient called was told Dr.Jordan advises to start back Lisinopril 10 mg every evening.

## 2012-01-26 NOTE — Telephone Encounter (Signed)
Addended by: Meda Klinefelter D on: 01/26/2012 04:52 PM   Modules accepted: Orders

## 2012-02-03 ENCOUNTER — Other Ambulatory Visit: Payer: Self-pay | Admitting: Cardiology

## 2012-02-07 ENCOUNTER — Encounter: Payer: Self-pay | Admitting: Cardiology

## 2012-02-22 ENCOUNTER — Telehealth: Payer: Self-pay | Admitting: Cardiology

## 2012-02-22 NOTE — Telephone Encounter (Signed)
New msg Pt wants to talk to you about his BP meds after 1 pm

## 2012-02-22 NOTE — Telephone Encounter (Signed)
Patient called, stating he is having problems with his B/P.States B/P is low 110/50 and will be faxing B/P readings.States 3 weeks ago decreased toprol on his own to 25 mg in am and 12.5 mg pm.States he feels hot and dizzy.Patient was told will discuss with Dr.Jordan and him back.

## 2012-02-23 NOTE — Telephone Encounter (Signed)
Patient called stated he had ct of his kidneys recently with [facility] and it revealed atherosclerosis in coronary artery.Dr.Jordan reviewed ct report and he advised doing a treadmill.Patient was told schedulers will be calling him back tomorrow 02/24/12 to schedule.Advised to hold metoprolol 24 hrs before treadmill.

## 2012-02-24 ENCOUNTER — Encounter: Payer: Self-pay | Admitting: *Deleted

## 2012-02-24 ENCOUNTER — Other Ambulatory Visit: Payer: Self-pay

## 2012-02-24 DIAGNOSIS — I10 Essential (primary) hypertension: Secondary | ICD-10-CM

## 2012-03-10 ENCOUNTER — Ambulatory Visit (INDEPENDENT_AMBULATORY_CARE_PROVIDER_SITE_OTHER): Payer: BC Managed Care – PPO | Admitting: Physician Assistant

## 2012-03-10 ENCOUNTER — Encounter: Payer: Self-pay | Admitting: Physician Assistant

## 2012-03-10 DIAGNOSIS — I10 Essential (primary) hypertension: Secondary | ICD-10-CM

## 2012-03-10 NOTE — Procedures (Signed)
Exercise Treadmill Test  Pre-Exercise Testing Evaluation Rhythm: normal sinus  Rate: 60  PR:  .16 QRS:  .08  QT:  .40 QTc: .40     Test  Exercise Tolerance Test Ordering MD: Peter Swaziland, MD  Interpreting MD: Tereso Newcomer PA-C  Unique Test No: 1  Treadmill:  1  Indication for ETT: HTN  Contraindication to ETT: No   Stress Modality: exercise - treadmill  Cardiac Imaging Performed: non   Protocol: standard Bruce - maximal  Max BP:  162/52  Max MPHR (bpm):  155 85% MPR (bpm):  131  MPHR obtained (bpm):  150 % MPHR obtained:  95%  Reached 85% MPHR (min:sec):  7:28 Total Exercise Time (min-sec):  9:30  Workload in METS:  10.9 Borg Scale: 17  Reason ETT Terminated:  desired heart rate attained    ST Segment Analysis At Rest: normal ST segments - no evidence of significant ST depression With Exercise: non-specific ST changes  Other Information Arrhythmia:  No Angina during ETT:  absent (0) Quality of ETT:  diagnostic  ETT Interpretation:  normal - no evidence of ischemia by ST analysis  Comments: Good exercise tolerance. No chest pain. Normal BP response to exercise. Patient has "scooped" ST segments at baseline which limits interpretation somewhat.  This persists throughout exercise. There is up-sloping ST depression (insignificant). No significant ST-T changes to suggest ischemia.   Recommendations: Follow up with Dr. Peter Swaziland as directed. Tereso Newcomer, PA-C  8:57 AM 03/10/2012

## 2012-03-27 ENCOUNTER — Other Ambulatory Visit: Payer: Self-pay | Admitting: Cardiology

## 2012-03-28 ENCOUNTER — Other Ambulatory Visit: Payer: Self-pay

## 2012-03-28 MED ORDER — LISINOPRIL-HYDROCHLOROTHIAZIDE 20-12.5 MG PO TABS: 1.0000 | ORAL_TABLET | Freq: Every day | ORAL | Status: DC

## 2012-03-28 NOTE — Addendum Note (Signed)
Addended by: Meda Klinefelter D on: 03/28/2012 05:36 PM   Modules accepted: Orders

## 2012-03-28 NOTE — Telephone Encounter (Signed)
Spoke to patient was asked about lisinopril dose.Patient stated he was taking lisinopril/hctz 20/12.5mg  every morning.Stated Dr.Jordan stopped lisinopril 20 mg at night.Prescription for lisinopril/hctz sent to [facility].

## 2012-03-29 ENCOUNTER — Encounter (HOSPITAL_COMMUNITY): Payer: Self-pay

## 2012-03-29 ENCOUNTER — Emergency Department (HOSPITAL_COMMUNITY): Payer: BC Managed Care – PPO

## 2012-03-29 ENCOUNTER — Emergency Department (HOSPITAL_COMMUNITY)
Admission: EM | Admit: 2012-03-29 | Discharge: 2012-03-30 | Disposition: A | Discharge status: Home / Self Care | Payer: BC Managed Care – PPO | Attending: Emergency Medicine | Admitting: Emergency Medicine

## 2012-03-29 DIAGNOSIS — R0602 Shortness of breath: Secondary | ICD-10-CM | POA: Insufficient documentation

## 2012-03-29 DIAGNOSIS — Z79899 Other long term (current) drug therapy: Secondary | ICD-10-CM | POA: Insufficient documentation

## 2012-03-29 DIAGNOSIS — E785 Hyperlipidemia, unspecified: Secondary | ICD-10-CM | POA: Insufficient documentation

## 2012-03-29 DIAGNOSIS — R110 Nausea: Secondary | ICD-10-CM | POA: Insufficient documentation

## 2012-03-29 DIAGNOSIS — I10 Essential (primary) hypertension: Secondary | ICD-10-CM | POA: Insufficient documentation

## 2012-03-29 DIAGNOSIS — R079 Chest pain, unspecified: Secondary | ICD-10-CM | POA: Insufficient documentation

## 2012-03-29 LAB — CBC WITH DIFFERENTIAL/PLATELET
Eosinophils Absolute: 0.1 10*3/uL (ref 0.0–0.7)
Eosinophils Relative: 1 % (ref 0–5)
Lymphs Abs: 1.7 10*3/uL (ref 0.7–4.0)
MCH: 30 pg (ref 26.0–34.0)
MCHC: 34.5 g/dL (ref 30.0–36.0)
MCV: 87.1 fL (ref 78.0–100.0)
Platelets: 178 10*3/uL (ref 150–400)
RBC: 4.96 MIL/uL (ref 4.22–5.81)

## 2012-03-29 LAB — COMPREHENSIVE METABOLIC PANEL
ALT: 31 U/L (ref 0–53)
BUN: 19 mg/dL (ref 6–23)
Calcium: 10.1 mg/dL (ref 8.4–10.5)
GFR calc Af Amer: >90 mL/min (ref >90)
Glucose, Bld: 94 mg/dL (ref 70–99)
Sodium: 138 mEq/L (ref 135–145)
Total Protein: 6.5 g/dL (ref 6.0–8.3)

## 2012-03-29 LAB — TROPONIN I: Troponin I: <0.3 ng/mL (ref <0.30)

## 2012-03-29 NOTE — ED Notes (Signed)
Escorted two family members back to room to visit patient.

## 2012-03-29 NOTE — ED Provider Notes (Signed)
History     CSN: [id number]  Arrival date & time 03/29/12  2112   First MD Initiated Contact with Patient 03/29/12 2209      Chief Complaint  Patient presents with  . Chest Pain    (Consider location/radiation/quality/duration/timing/severity/associated sxs/prior treatment) HPI Comments: Patient was at church gathering when he developed sudden onset of pressure in the chest.  He tells me it radiated to the left arm, was associated with nausea, and the wife reports that he became clammy.  It seemed to resolve shortly after ems arrived and is currently pain-free.  He recently underwent stress test by Dr. Swaziland for an apparent plaque that showed up as an incidental finding on a ct scan.    Patient is a 66 y.o. male presenting with chest pain. The history is provided by the patient.  Chest Pain The chest pain began 1 - 2 hours ago. Duration of episode(s) is 15 minutes. Chest pain occurs constantly. The chest pain is resolved. Associated with: nothing. The severity of the pain is moderate. The quality of the pain is described as pressure-like. The pain radiates to the left arm. Exacerbated by: nothing. Primary symptoms include shortness of breath and nausea. Pertinent negatives for primary symptoms include no fever. He tried aspirin for the symptoms.     Past Medical History  Diagnosis Date  . Hypertension   . Hyperlipidemia   . Colon cancer     s/p colon resection in 2004  . S/P cardiac catheterization 1995 and 1998    following abnormal stress tests. Both reportedly normal.   . Anxiety     Past Surgical History  Procedure Date  . Cardiac catheterization 07/04/1997  . Colon resection 2004  . L4 discectomy   . Elbow surgery   . Transurethral resection of prostate     Family History  Problem Relation Age of Onset  . Emphysema Mother   . Heart attack Father     History  Substance Use Topics  . Smoking status: Never Smoker   . Smokeless tobacco: Not on file  . Alcohol  Use: 1.1 oz/week    1 Glasses of wine, 1 Drinks containing 0.5 oz of alcohol per week     each night      Review of Systems  Constitutional: Negative for fever.  Respiratory: Positive for shortness of breath.   Cardiovascular: Positive for chest pain.  Gastrointestinal: Positive for nausea.  All other systems reviewed and are negative.    Allergies  Other  Home Medications   Current Outpatient Rx  Name Route Sig Dispense Refill  . ASPIRIN 81 MG PO TABS Oral Take 81 mg by mouth daily.      . B COMPLEX PO TABS Oral Take 1 tablet by mouth 2 (two) times daily.     Marland Kitchen DILTIAZEM HCL ER COATED BEADS 240 MG PO CP24 Oral Take 240 mg by mouth every evening.    Marland Kitchen OMEGA-3 FATTY ACIDS 1000 MG PO CAPS Oral Take 1,200 mg by mouth daily.      Marland Kitchen GLUCOSAMINE-CHONDROITIN 500-400 MG PO TABS Oral Take 1 tablet by mouth 2 (two) times daily.      Marland Kitchen LISINOPRIL-HYDROCHLOROTHIAZIDE 20-12.5 MG PO TABS Oral Take 1 tablet by mouth daily.    Marland Kitchen LORAZEPAM 0.5 MG PO TABS Oral Take 0.5 mg by mouth daily as needed. When heart races    . METOPROLOL SUCCINATE ER 25 MG PO TB24 Oral Take 25 mg by mouth 2 (two) times daily.    Marland Kitchen  ONE-DAILY MULTI VITAMINS PO TABS Oral Take 1 tablet by mouth daily.      Marland Kitchen ROSUVASTATIN CALCIUM 10 MG PO TABS Oral Take 10 mg by mouth every evening.    Marland Kitchen ZALEPLON 10 MG PO CAPS Oral Take 10 mg by mouth at bedtime as needed. For sleep      BP 119/69  Pulse 72  Temp 97.8 F (36.6 C) (Oral)  Resp 13  SpO2 99%  Physical Exam  Nursing note and vitals reviewed. Constitutional: He is oriented to person, place, and time. He appears well-developed and well-nourished. No distress.  HENT:  Head: Normocephalic and atraumatic.  Neck: Normal range of motion. Neck supple.  Cardiovascular: Normal rate, regular rhythm and normal heart sounds.   No murmur heard. Pulmonary/Chest: Effort normal and breath sounds normal. No respiratory distress. He has no wheezes.  Abdominal: Soft. Bowel sounds are  normal. He exhibits no distension. There is no tenderness.  Musculoskeletal: Normal range of motion. He exhibits no edema.  Neurological: He is alert and oriented to person, place, and time.  Skin: Skin is warm and dry. He is not diaphoretic.    ED Course  Procedures (including critical care time)   Labs Reviewed  CBC WITH DIFFERENTIAL  COMPREHENSIVE METABOLIC PANEL  TROPONIN I   No results found.   No diagnosis found.   Date: 03/29/2012  Rate: 60  Rhythm: normal sinus rhythm  QRS Axis: normal  Intervals: normal  ST/T Wave abnormalities: normal  Conduction Disutrbances:none  Narrative Interpretation:   Old EKG Reviewed: unchanged    MDM  The patient presents after an episode of chest discomfort.  The ekg and workup are unremarkable with a negative troponin.  He had a stress test at [facility] three weeks ago that was normal.  He feels fine now and wants to go home.  I discussed admission with him, however he is insistent on going home.  He assures me he will return if he worsens and call Dr. Swaziland on Friday.          Geoffery Lyons, MD 03/29/12 [phone]

## 2012-03-29 NOTE — ED Notes (Addendum)
Patient was a church and developed a sudden onset of non-radiating, substernal chest pain w/ left arm tingling and some shortness of breath. No N/V, diaphoresis or abd pain. Pain described as heavy and resolved prior to EMS arrival. Patient had ASA 324 mg, no nitro d/t being pain free. A/Ox4, resp e/u, NAD.    To note, patient has just had a complete physical and cardiac workup with PCP, Dr. Swaziland. All testing was unremarkable, per patient.

## 2012-03-31 ENCOUNTER — Telehealth: Payer: Self-pay | Admitting: Nurse Practitioner

## 2012-03-31 NOTE — Telephone Encounter (Signed)
Pt calling re episode on Wednesday, went to [place], feels nothing wrong but going on a cruise and his wife's nurse friend suggested he get rx for nitro to take with him, pls call

## 2012-03-31 NOTE — Telephone Encounter (Signed)
Patient called stated he went to ER 03/29/12 with chest pain.States all lab work came back normal and he was sent home.Stated he was leaving early in morning to go on a cruise and wanted to have NTG SL if needed.Spoke with Dr.Jordan he ordered NTG 0.4 mg SL as needed.# 25 refill x 6 called to [facility].Patient was advised if continues to have chest pain call back.

## 2012-04-17 ENCOUNTER — Other Ambulatory Visit: Payer: Self-pay | Admitting: Cardiology

## 2012-04-17 MED ORDER — ROSUVASTATIN CALCIUM 10 MG PO TABS: 10.0000 mg | ORAL_TABLET | Freq: Every evening | ORAL | Status: DC

## 2012-04-20 ENCOUNTER — Telehealth: Payer: Self-pay | Admitting: Cardiology

## 2012-04-20 NOTE — Telephone Encounter (Signed)
New Problem:    Patient called in wanting to speak with you about his medications.  Claims that they are not working for him and would like you to review them with Dr. Swaziland.  Please call back.

## 2012-04-20 NOTE — Telephone Encounter (Signed)
Patient called stated heart is pounding at night.States he cannot sleep.States he takes generic toprol xl 25 mg twice a day,cardizem 240 mg daily.Patient was told Dr.Jordan out of office will check with him tomorrow 04/21/12 and call back.

## 2012-04-21 NOTE — Telephone Encounter (Signed)
Patient called stated brand name toprol cost too much.States last night he decreased toprol to 25 mg am and 12.5 mg pm and he rested much better.States he will try this over the weekend,monitor his B/P and call back next week.

## 2012-04-25 NOTE — Telephone Encounter (Signed)
Patient called no answer.LMTC. 

## 2012-04-26 NOTE — Telephone Encounter (Signed)
Patient called stated he has changed toprol 25 mg am ,12.5 mg pm.States he is concerned about 2 things #1 heart races every other night #2 heart palpitations.States this happens around 8 to 9 pm. States he wanted Dr.Jordan to know his father died at age 66 with massive MI and paternal grandfather died at age 63 massive MI.States the closer he gets to this age the more he thinks about having a MI.States he wonders if he changed the order of his medication would this help his problems.Patient was told Dr.Jordan out of office today will check with him tomorrow 04/27/12 and call him back.

## 2012-04-28 NOTE — Telephone Encounter (Signed)
Patient called was told spoke with Dr.Jordan and he is aware of medication changes.Advised to keep appointment with Dr.Jordan 05/25/12 and call sooner if needed.

## 2012-05-12 ENCOUNTER — Encounter: Payer: Self-pay | Admitting: Cardiology

## 2012-05-25 ENCOUNTER — Ambulatory Visit (INDEPENDENT_AMBULATORY_CARE_PROVIDER_SITE_OTHER): Payer: BC Managed Care – PPO | Admitting: Cardiology

## 2012-05-25 ENCOUNTER — Encounter: Payer: Self-pay | Admitting: Cardiology

## 2012-05-25 VITALS — BP 118/68 | HR 52 | Ht 70.0 in | Wt 167.8 lb

## 2012-05-25 DIAGNOSIS — E785 Hyperlipidemia, unspecified: Secondary | ICD-10-CM

## 2012-05-25 DIAGNOSIS — I10 Essential (primary) hypertension: Secondary | ICD-10-CM

## 2012-05-25 DIAGNOSIS — I2510 Atherosclerotic heart disease of native coronary artery without angina pectoris: Secondary | ICD-10-CM | POA: Insufficient documentation

## 2012-05-25 MED ORDER — LISINOPRIL 20 MG PO TABS: 20.0000 mg | ORAL_TABLET | Freq: Every day | ORAL | Status: DC

## 2012-05-25 NOTE — Patient Instructions (Signed)
You can try and reduce your am toprol to 1/2 tab.  Continue your other therapy.

## 2012-05-25 NOTE — Progress Notes (Signed)
Robert [name] Date of Birth: 02/18/1946   History of Present Illness: Robert [name] is seen today for followup. A couple of months ago he was experiencing symptoms of tachycardia with heart rates up to 80-90. This has since improved. He now complains of some intermittent lightheadedness and feeling "foggy". His blood pressure has been in good control. He is also concerned about his risk of heart attack since his father and grandfather both had heart attacks around age of 51.  Current Outpatient Prescriptions on File Prior to Visit  Medication Sig Dispense Refill  . aspirin 81 MG tablet Take 81 mg by mouth daily.        Marland Kitchen b complex vitamins tablet Take 1 tablet by mouth 2 (two) times daily.       Marland Kitchen diltiazem (CARDIZEM CD) 240 MG 24 hr capsule Take 240 mg by mouth every evening.      . fish oil-omega-3 fatty acids 1000 MG capsule Take 1,200 mg by mouth daily.        Marland Kitchen glucosamine-chondroitin 500-400 MG tablet Take 1 tablet by mouth 2 (two) times daily.        Marland Kitchen lisinopril-hydrochlorothiazide (PRINZIDE,ZESTORETIC) 20-12.5 MG per tablet Take 1 tablet by mouth daily. Lisinopril 10 mg at night      . LORazepam (ATIVAN) 0.5 MG tablet Take 0.5 mg by mouth daily as needed. When heart races       . metoprolol succinate (TOPROL-XL) 25 MG 24 hr tablet Take 25 mg by mouth 2 (two) times daily. 25 mg in the morning and 12.5 mg in the evening      . Multiple Vitamin (MULTIVITAMIN) tablet Take 1 tablet by mouth daily.        Marland Kitchen NITROSTAT 0.4 MG SL tablet       . rosuvastatin (CRESTOR) 10 MG tablet Take 1 tablet (10 mg total) by mouth every evening.  30 tablet  5  . zaleplon (SONATA) 10 MG capsule Take 10 mg by mouth at bedtime as needed. For sleep      . DISCONTD: hyoscyamine (LEVSIN SL) 0.125 MG SL tablet         Allergies  Allergen Reactions  . Other     Allergic to dye used when you give blood. Says its an orange dye    Past Medical History  Diagnosis Date  . Hypertension   . Hyperlipidemia   .  Colon cancer     s/p colon resection in 2004  . S/P cardiac catheterization 1995 and 1998    following abnormal stress tests. Both reportedly normal.   . Anxiety     Past Surgical History  Procedure Date  . Cardiac catheterization 07/04/1997  . Colon resection 2004  . L4 discectomy   . Elbow surgery   . Transurethral resection of prostate     History  Smoking status  . Never Smoker   Smokeless tobacco  . Not on file    History  Alcohol Use  . 1.1 oz/week  . 1 Glasses of wine, 1 Drinks containing 0.5 oz of alcohol per week    each night    Family History  Problem Relation Age of Onset  . Emphysema Mother   . Heart attack Father     Review of Systems: All other systems are reviewed and are negative. Physical Exam: BP 118/68  Pulse 52  Ht 5\' 10"  (1.778 m)  Wt 167 lb 12.8 oz (76.114 kg)  BMI 24.08 kg/m2 He is a pleasant white  male in no acute distress. His HEENT exam is unremarkable. He has no JVD or bruits. Lungs are clear. Cardiac exam reveals a regular rate and rhythm without gallop, murmur, or click. Abdomen is soft and nontender. He has no edema. Pedal pulses are good.  LABORATORY DATA:   Assessment / Plan: 1. Hypertension. Blood pressure is under excellent control currently. Since he is experiencing some lightheadedness I recommended reducing his dose of Toprol in the morning to 12.5 mg. He will monitor his blood pressure closely.  2. Hyperlipidemia, controlled on Crestor and fish oil.  3. Coronary calcification noted incidentally on a CT scan. This was in the mid RCA distribution. Subsequent exercise stress test was normal at a good workload. I've reassured him that based on these findings his near-term risk for cardiac events is low. Optimal treatment is to continue to work on risk factor modification. He is on appropriate medical therapy. He exercises regularly and eats a heart healthy diet. Unless he develops new symptoms of angina there is no need for  additional testing.

## 2012-05-31 ENCOUNTER — Encounter: Payer: Self-pay | Admitting: Cardiology

## 2012-06-08 ENCOUNTER — Telehealth: Payer: Self-pay | Admitting: Cardiology

## 2012-06-08 NOTE — Telephone Encounter (Signed)
Patient called no answer.LMTC. 

## 2012-06-08 NOTE — Telephone Encounter (Signed)
plz return call to patient on cell [phone], experiencing periods of elevated blood pressure.

## 2012-06-09 NOTE — Telephone Encounter (Signed)
Patient called stated his B/P 90/50 last night.States he saw his PCP Dr Clelia Croft last week he decreased diltiazem to 180 mg due to swelling in rt ankle.States his B/P today is 125/84 p 65,135/85 p 70.States he is concerned his B/P is up and down,he thinks there is a reason for this.States he will adjust his medication and monitor his B/P and call back next week.

## 2012-07-06 ENCOUNTER — Telehealth: Payer: Self-pay | Admitting: Cardiology

## 2012-07-06 NOTE — Telephone Encounter (Signed)
New problem:  C/o still having issues with his heart rate seems to happen every other night around 7 pm  range 60-90. Stay 2-3 hrs.

## 2012-07-06 NOTE — Telephone Encounter (Signed)
PT  AWARE./CY 

## 2012-07-06 NOTE — Telephone Encounter (Signed)
SPOKE WITH  PT, PT  CALLED COMPLAINING  RACING  HEART RATE 85-90 IN  EVENING  TIME  AROUND 7:00-8:00 PM   AND LASTING UNTIL  APPROX 1:00 AM EVERY OTHER NIGHT HAS BEEN HAPPENING FOR APPROX  6 MO   PER PT  DR Swaziland HAS BEEN  ADJUSTING MEDS  CURRENTLY PT IS TAKING  TOPROL  25 MG  AND LISINOPRIL/HCTZ  20/12.5 MG  IN AM  AND IN THE PM  IS TAKING  TOPROL 12.5 MG  AND DILTIAZEM  180 MG  PT AWARE WILL FORWARD TO DR Swaziland FOR REVIEW./CY

## 2012-07-06 NOTE — Telephone Encounter (Signed)
He could try taking Toprol 12.5 mg in the morning and 25 mg earlier in the evening (6 pm) to see if this will help.  Peter Swaziland MD, [facility]

## 2012-07-27 ENCOUNTER — Other Ambulatory Visit (HOSPITAL_COMMUNITY): Payer: Self-pay | Admitting: Urology

## 2012-07-27 DIAGNOSIS — N281 Cyst of kidney, acquired: Secondary | ICD-10-CM

## 2012-08-03 ENCOUNTER — Ambulatory Visit (HOSPITAL_COMMUNITY)
Admission: RE | Admit: 2012-08-03 | Discharge: 2012-08-03 | Disposition: A | Discharge status: Home / Self Care | Payer: BC Managed Care – PPO | Source: Ambulatory Visit | Attending: Urology | Admitting: Urology

## 2012-08-03 DIAGNOSIS — N281 Cyst of kidney, acquired: Secondary | ICD-10-CM | POA: Insufficient documentation

## 2012-08-03 LAB — POCT I-STAT, CHEM 8
Chloride: 103 mEq/L (ref 96–112)
HCT: 45 % (ref 39.0–52.0)
Potassium: 4.2 mEq/L (ref 3.5–5.1)

## 2012-08-03 MED ORDER — GADOBENATE DIMEGLUMINE 529 MG/ML IV SOLN
15.0000 mL | Freq: Once | INTRAVENOUS | Status: AC | PRN
  Administered 2012-08-03: 15 mL via INTRAVENOUS

## 2012-08-28 ENCOUNTER — Telehealth: Payer: Self-pay | Admitting: Cardiology

## 2012-08-28 NOTE — Telephone Encounter (Signed)
Pt wanted you to know and to update Dr. Swaziland to some medication changes.  Metoprolol Succ.  37.5mg  QAM. Diltiazem 180mg  every evening.

## 2012-08-28 NOTE — Telephone Encounter (Signed)
Pt has some info to have put in his chart for dr Swaziland

## 2012-08-29 NOTE — Telephone Encounter (Signed)
Dr.Jordan was made aware. 

## 2012-09-22 ENCOUNTER — Other Ambulatory Visit: Payer: Self-pay | Admitting: *Deleted

## 2012-09-22 MED ORDER — LISINOPRIL-HYDROCHLOROTHIAZIDE 20-12.5 MG PO TABS: 1.0000 | ORAL_TABLET | Freq: Every day | ORAL | Status: DC

## 2012-10-01 ENCOUNTER — Telehealth: Payer: Self-pay | Admitting: Nurse Practitioner

## 2012-10-01 NOTE — Telephone Encounter (Signed)
Pt called this evening stating that he's been monitoring his bp and hr frequently throughout the day and has noted that his HR has been rising in the evenings into the low 100's.  This is sometimes associated with palpitations.  He says that he has a long h/o palpitations for which he is on toprol and diltiazem therapy.  He takes his toprol in the AM and dilt 180 in the evening.  Based on the number of readings he has supplied me, it appears that he checks his hr and bp 10+ x/day.  He is asking what we can do about these rises in HR.  I advised that without seeing an ECG it's difficult to know what rhythm he is in while he is tachycardic.  If he is experiencing palpitations he may take a 1/2 of toprol tablet (12.5mg ) now.  I also advised that he contact the office in the AM to arrange a f/u appt as he may benefit from placement of an event monitor to assess his rhythm during these episodes.  He verbalized understanding and plans to request an appt tomorrow.

## 2012-10-02 ENCOUNTER — Telehealth: Payer: Self-pay | Admitting: Cardiology

## 2012-10-02 NOTE — Telephone Encounter (Signed)
Patient called stated he has been recently diagnosed with sleep apnea.States he started using a cpap machine first of 12/13 and feels so much better.States his B/P has been running low and he stopped pm dose of toprol 12.5 mg.States last night his heart rate was over a 100 and he called office.States Ward Givens called him back and restarted toprol.Patient was told to monitor B/P and call back if starts running low.6 month appointment scheduled with Dr.Jordan 11/21/12.

## 2012-10-02 NOTE — Telephone Encounter (Signed)
New problem:    Patient called the on-call service last night spoke with Ward Givens. Was advise to have an ekg. Patient leaving for [facility] [place] for 3 days .

## 2012-10-17 ENCOUNTER — Other Ambulatory Visit: Payer: Self-pay | Admitting: Nurse Practitioner

## 2012-10-18 NOTE — Telephone Encounter (Signed)
Called patient to verify dosage of metoprolol succinate (TOPROL-XL) 25 MG 24 hr tablet so I could send it to be refilled. No answer so will try again later today to call patient.

## 2012-10-23 ENCOUNTER — Other Ambulatory Visit: Payer: Self-pay

## 2012-10-23 MED ORDER — DILTIAZEM HCL ER COATED BEADS 240 MG PO CP24: 240.0000 mg | ORAL_CAPSULE | Freq: Every evening | ORAL | Status: DC

## 2012-10-23 MED ORDER — METOPROLOL SUCCINATE ER 25 MG PO TB24: 25.0000 mg | ORAL_TABLET | Freq: Two times a day (BID) | ORAL | Status: DC

## 2012-10-31 ENCOUNTER — Other Ambulatory Visit: Payer: Self-pay

## 2012-10-31 MED ORDER — ROSUVASTATIN CALCIUM 10 MG PO TABS: 10.0000 mg | ORAL_TABLET | Freq: Every evening | ORAL | Status: DC

## 2012-11-14 ENCOUNTER — Other Ambulatory Visit: Payer: Self-pay | Admitting: Dermatology

## 2012-11-21 ENCOUNTER — Ambulatory Visit (INDEPENDENT_AMBULATORY_CARE_PROVIDER_SITE_OTHER): Payer: BC Managed Care – PPO | Admitting: Cardiology

## 2012-11-21 ENCOUNTER — Encounter: Payer: Self-pay | Admitting: Cardiology

## 2012-11-21 VITALS — BP 118/60 | HR 70 | Ht 70.0 in | Wt 169.4 lb

## 2012-11-21 DIAGNOSIS — I2510 Atherosclerotic heart disease of native coronary artery without angina pectoris: Secondary | ICD-10-CM

## 2012-11-21 DIAGNOSIS — E785 Hyperlipidemia, unspecified: Secondary | ICD-10-CM

## 2012-11-21 DIAGNOSIS — I10 Essential (primary) hypertension: Secondary | ICD-10-CM

## 2012-11-21 NOTE — Patient Instructions (Signed)
Continue your current therapy  I will see you in 6 months.   

## 2012-11-21 NOTE — Progress Notes (Signed)
Robert [name] Date of Birth: Apr 12, 1946   History of Present Illness: Robert [name] is seen today for followup. He states he is doing very well today. He has recently been diagnosed with sleep apnea and is on CPAP therapy. He was having some heart racing in the evening with heart rate up to 110 beats per minute. He started taking his lisinopril in the morning and metoprolol in the evening and this has resolved.  Current Outpatient Prescriptions on File Prior to Visit  Medication Sig Dispense Refill  . aspirin 81 MG tablet Take 81 mg by mouth daily.        Marland Kitchen b complex vitamins tablet Take 1 tablet by mouth 2 (two) times daily.       . fish oil-omega-3 fatty acids 1000 MG capsule Take 1,200 mg by mouth daily.        Marland Kitchen glucosamine-chondroitin 500-400 MG tablet Take 1 tablet by mouth 2 (two) times daily.        Marland Kitchen lisinopril-hydrochlorothiazide (PRINZIDE,ZESTORETIC) 20-12.5 MG per tablet Take 1 tablet by mouth daily. Lisinopril 10 mg at night  30 tablet  5  . LORazepam (ATIVAN) 0.5 MG tablet Take 0.5 mg by mouth daily as needed. When heart races       . Multiple Vitamin (MULTIVITAMIN) tablet Take 1 tablet by mouth daily.        Marland Kitchen NITROSTAT 0.4 MG SL tablet       . rosuvastatin (CRESTOR) 10 MG tablet Take 1 tablet (10 mg total) by mouth every evening.  30 tablet  5  . zaleplon (SONATA) 10 MG capsule Take 10 mg by mouth at bedtime as needed. For sleep      . [DISCONTINUED] hyoscyamine (LEVSIN SL) 0.125 MG SL tablet        No current facility-administered medications on file prior to visit.    Allergies  Allergen Reactions  . Other     Allergic to dye used when you give blood. Says its an orange dye    Past Medical History  Diagnosis Date  . Hypertension   . Hyperlipidemia   . Colon cancer     s/p colon resection in 2004  . S/P cardiac catheterization 1995 and 1998    following abnormal stress tests. Both reportedly normal.   . Anxiety     Past Surgical History  Procedure  Laterality Date  . Cardiac catheterization  07/04/1997  . Colon resection  2004  . L4 discectomy    . Elbow surgery    . Transurethral resection of prostate      History  Smoking status  . Never Smoker   Smokeless tobacco  . Not on file    History  Alcohol Use  . 1.1 oz/week  . 1 Glasses of wine, 1 Drinks containing 0.5 oz of alcohol per week    Comment: each night    Family History  Problem Relation Age of Onset  . Emphysema Mother   . Heart attack Father     Review of Systems: All other systems are reviewed and are negative. Physical Exam: BP 118/60  Pulse 70  Ht 5\' 10"  (1.778 m)  Wt 169 lb 6.4 oz (76.839 kg)  BMI 24.31 kg/m2  SpO2 96% He is a pleasant white male in no acute distress. His HEENT exam is unremarkable. He has no JVD or bruits. Lungs are clear. Cardiac exam reveals a regular rate and rhythm without gallop, murmur, or click. Abdomen is soft and nontender. He has  no edema. Pedal pulses are good.  LABORATORY DATA:   Assessment / Plan: 1. Hypertension. Blood pressure is under excellent control currently.   2. Hyperlipidemia, controlled on Crestor and fish oil.  3. Coronary calcification noted incidentally on a CT scan. This was in the mid RCA distribution. Subsequent exercise stress test was normal at a good workload.  4. Obstructive sleep apnea now on CPAP.

## 2012-12-03 ENCOUNTER — Telehealth: Payer: Self-pay | Admitting: Physician Assistant

## 2012-12-03 NOTE — Telephone Encounter (Signed)
Patient called answering service for advice on his HR. He sees Dr. Swaziland for HTN and also has h/o coronary calcification noted on CT scan. His PCP recently made adjustments to his BP meds to include 1) cutting out evening dose of lisinopril (is on lisinopril/HCTZ), 2) increasing Toprol to 25mg  BID, and 3) cutting down diltiazem from 240mg  to 180mg  daily (the patient has been working on diet and exercise). This evening he was working outside for several hours and when he came in he felt somewhat flushed. HR was 98 and BP 132/73. He took his PM dose of metoprolol and HR remained around 100 with BP 118/69. He became concerned that his HR was remaining too high so he took an additional Toprol now with HR 86 and BP nearly the same as the last one. He is wondering if he should take another Toprol due to the fact that his HR is usually in the 60s-70s. I advised him that a normal HR is 60-100 and 86 falls within this range. However, he is acutely aware of his HR being elevated and some patients happen to be bothered by visceral hyperawareness. He denies any CP, SOB, or pulse irregularity. I told him it is difficult to evaluate his rhythm over the phone, but if he is feeling well right now, he should not take additional Toprol. Because this is a long-acting med, he may see additional lowering of HR over the next several hours. Additionally, I advised him to gradually drink about 2 glasses of water over the next several hours as he may have a component of dehydration from working outside. He barely drank anything today and agrees this could be contributing. I told him to observe how he feels over the next several days, and if he continues to be bothered by sensation of elevated HR, he should call office back to discuss further med adjustment. It may be that he should cut out lisinopril/HCTZ in lieu of going back up on diltiazem or changing to Lopressor. ER precautions given. He verbalized understanding and gratitude.

## 2012-12-04 ENCOUNTER — Telehealth: Payer: Self-pay | Admitting: Cardiology

## 2012-12-04 NOTE — Telephone Encounter (Signed)
Patient called no answer.LMTC. 

## 2012-12-04 NOTE — Telephone Encounter (Signed)
Pt is requesting to talk to [place].  Advised pt that she is working in a satellite office today and my not receive a call back until tomorrow.  Pt agreed.

## 2012-12-04 NOTE — Telephone Encounter (Signed)
New problem    Metoprolol need to be increase ? 125 tab in am .  125 tab in pm.  The new prescribe is running out only have 3 pills left.

## 2012-12-05 MED ORDER — METOPROLOL SUCCINATE ER 25 MG PO TB24: 25.0000 mg | ORAL_TABLET | Freq: Two times a day (BID) | ORAL | Status: DC

## 2012-12-05 NOTE — Telephone Encounter (Signed)
Spoke to patient he stated he needed refill on metoprolol.Refill sent to [facility].

## 2012-12-20 ENCOUNTER — Other Ambulatory Visit: Payer: Self-pay | Admitting: Dermatology

## 2013-01-01 ENCOUNTER — Telehealth: Payer: Self-pay | Admitting: *Deleted

## 2013-01-01 ENCOUNTER — Telehealth: Payer: Self-pay | Admitting: Cardiology

## 2013-01-01 MED ORDER — METOPROLOL SUCCINATE ER 25 MG PO TB24: ORAL_TABLET | ORAL | Status: DC

## 2013-01-01 NOTE — Telephone Encounter (Signed)
Notified patient that Dr. Swaziland wants him to continue with the extra half tab of Toprol XL 25mg  tablet in the evening. Rx completed to pharmacy.

## 2013-01-01 NOTE — Telephone Encounter (Signed)
Patient states he has been taking the extra dose as noted below with "good results". BP 110/60-70 and HR 60-65 consistently. Would like to get feedback from Dr. Swaziland on whether to continue the extra half tab in the evening and if so, he will need his refill changed to reflect the need for extra tabs.

## 2013-01-01 NOTE — Telephone Encounter (Signed)
New Problem:    Patient called in wanting to speak with someone about adjusting his metoprolol succinate (TOPROL-XL) 25 MG 24 hr tablet to take a half tab extra in the evening because his BP was running higher in the evening.  Patient called and spoke with the After Hours MD and they suggested the evening increase, which has been effective.  Please call back.

## 2013-01-01 NOTE — Telephone Encounter (Signed)
See previous 01/01/13 note.

## 2013-01-01 NOTE — Telephone Encounter (Signed)
Yes I would continue how he is taking medications now. OK to update his prescription.  Peter Swaziland MD, [facility]

## 2013-02-08 ENCOUNTER — Other Ambulatory Visit: Payer: Self-pay

## 2013-02-08 MED ORDER — ROSUVASTATIN CALCIUM 10 MG PO TABS: 10.0000 mg | ORAL_TABLET | Freq: Every evening | ORAL | Status: DC

## 2013-02-08 NOTE — Telephone Encounter (Signed)
2. Hyperlipidemia, controlled on Crestor and fish oil.  Patient Instructions  Continue your current therapy  I will see you in 6 months.  Chart Reviewed By  Charna Elizabeth, LPN  on 1/61/0960 11:41 AM     Previous Visit     Provider Department Encounter #  10/31/2012  5:36 PM Peter Swaziland, MD [facility] [phone]

## 2013-03-13 ENCOUNTER — Telehealth: Payer: Self-pay | Admitting: Cardiology

## 2013-03-13 DIAGNOSIS — R079 Chest pain, unspecified: Secondary | ICD-10-CM

## 2013-03-13 NOTE — Telephone Encounter (Signed)
Returned call to patient he stated he just got back home from [place] late last night.Stated while on his trip he had lf sided chest pain.Stated the pain is like a pins and needle sensation.Stated he did a lot of walking and he noticed occasional "pins and needle" sensation in left chest.Stated one night he had  fast heart beat rate 120 beats/min,took his medication and heart rate went down to 90 beats/min.Stated he is worried his father died at age 66 from a MI would like to have a test done to see if he has any blockages.Will speak to Dr.Jordan tomorrow 03/14/13 and call him back.Patient was advised to go to [facility] ER if needed.

## 2013-03-13 NOTE — Telephone Encounter (Signed)
New Prob     Pt states he is experiencing minor infrequent chest pains on the left side. Pt is concerned and would like to speak with nurse.

## 2013-03-14 NOTE — Telephone Encounter (Signed)
Returned call to patient Dr.Jordan advised stress myoview.Schedulers will call back tomorrow to schedule.

## 2013-03-21 ENCOUNTER — Encounter: Payer: Self-pay | Admitting: Neurology

## 2013-03-21 ENCOUNTER — Ambulatory Visit (INDEPENDENT_AMBULATORY_CARE_PROVIDER_SITE_OTHER): Payer: BC Managed Care – PPO | Admitting: Neurology

## 2013-03-21 VITALS — BP 98/62 | HR 46 | Temp 96.0°F | Ht 70.0 in | Wt 172.0 lb

## 2013-03-21 DIAGNOSIS — G4730 Sleep apnea, unspecified: Secondary | ICD-10-CM | POA: Insufficient documentation

## 2013-03-21 DIAGNOSIS — N401 Enlarged prostate with lower urinary tract symptoms: Secondary | ICD-10-CM

## 2013-03-21 DIAGNOSIS — N138 Other obstructive and reflux uropathy: Secondary | ICD-10-CM

## 2013-03-21 HISTORY — DX: Benign prostatic hyperplasia with lower urinary tract symptoms: N40.1

## 2013-03-21 NOTE — Progress Notes (Signed)
[facility]  Provider:  Dr Meliton Samad Referring Provider: Kari Baars, MD Primary Care Physician:  Kari Baars, MD  Chief Complaint  Patient presents with  . Follow-up    INSOMNIA,rm 10,sleep study results    HPI:  Robert [name] is a 67 y.o. male here as a  revisit , referred originally  from Dr. Margarita Grizzle , Urologist . Dr.  Clelia Croft for PCP.  Mr. cup of underwent a surgery about 20 years ago but after had begun having fragmented sleep and finally he would wake up 5-7 times at night to relieve himself. He tried Flomax without success and then by the recently changed urologists and 2013. Dr. Margarita Grizzle finally suspected that sleep apnea may be a contributing cause. He also had seen a dentist about the same time noticed him falling asleep in the dental chair and urgent to get an workup for obstructive sleep apnea. Dr. Clelia Croft, his primary care physician ordered a sleep study and the patient tested positive for apnea he was titrated to CPAP in a split night procedure the study was performed. The split night procedure took place on 08-16-12 the patient's BMI at that time was 25 his Epworth sleepiness score 4/24 points his neck circumference 15-1/2 inches. He and was to exhalatory at one point. Apnea hypotony index was 18.5 and RDI 18.9 there was no strong REM complement the supine complement. Lowest desaturation was 78% supine REM sleep was 32.8 minutes of desaturation.  The patient was titrated to 6 cm water pressure as an AHI of less than 5.  He then underwent a  Auto-  titration and compliance report through the December 2013 into January 2040,  he used his machine 6 hours 30 minutes at night was 100% compliant , his  AHI  was 2.9 in late January 2014 .  Today be obtained and download the patient's AHI is 1.8 now ! His average daily usage 4 hours 56 minutes,  he used the machine  94% of the last 90 days over 4 hours.  The  machine is not set at 8 cm water  ,  2  centimeter  EPR  relief this is a auto setting-95%  percentile is 7.9 cm water.  He has no longer  a dry nose,  a dry mouth.  He will keep his auto titration from 4- 8 cm , and will continue the use of his nasal pillow : a whisp model  with large cushion.   Epworth Sleepiness Scale today is score today is 4 points, and  FSS 29 points, and GDS  depression score zero  Point.  No longer nightmares , reduced Nocturia.   Review of Systems: Out of a complete 14 system review, the patient complains of only the following symptoms, and all other reviewed systems are negative. Nocturia  is down  from 6-7 times at night to 3 times.  Epworth is  4   History   Social History  . Marital Status: Married    Spouse Name: N/A    Number of Children: 3  . Years of Education: N/A   Occupational History  . ADMIN     [employer]   Social History Main Topics  . Smoking status: Never Smoker   . Smokeless tobacco: Not on file  . Alcohol Use: 1.1 oz/week    1 Glasses of wine, 1 Drinks containing 0.5 oz of alcohol per week     Comment: each night  . Drug Use: No  . Sexually  Active: Yes   Other Topics Concern  . Not on file   Social History Narrative   Sleep apnea 28 AHI was reduced to 3.4- set to  7 cm water pressure on nasal mask eson or wisp.  He sleep  nonsupine, less nocturia.  Tennis ball method explained.  Patient does not want machine set to a certain pressure.     Downloaded CMS compliance s etsablsihed .  Insomnia not improved further - will try seroquel as  medication approach and discussed behaviour therapy approach .   send for n beahiour therapy and started trial of SEROQUEL>  25 minute visit including CMS compliance and downloading.           Family History  Problem Relation Age of Onset  . Emphysema Mother   . Heart attack Father   . Hypertension Father   . Hypertension Other   . High Cholesterol Other     Past Medical History  Diagnosis Date  . Hypertension   . Hyperlipidemia   . Colon cancer      s/p colon resection in 2004  . S/P cardiac catheterization 1995 and 1998    following abnormal stress tests. Both reportedly normal.   . Anxiety   . Neuropathy     SECONDARY TO CHEMOTHERAPY  . Sleep apnea with use of continuous positive airway pressure (CPAP)     08-16-12  AHi 18.5  78% nadir, titratet to 6 cm with AHi of 4 .   Marland Kitchen Nocturia associated with benign prostatic hypertrophy 03/21/2013    Past Surgical History  Procedure Laterality Date  . Cardiac catheterization  07/04/1997  . Colon resection  2004  . L4 discectomy    . Elbow surgery Left 2011    INFECTED OLECRANON BURSITIS SURGERY  . Transurethral resection of prostate    . Retinal detachment surgery  1998  . Septic joint      Current Outpatient Prescriptions  Medication Sig Dispense Refill  . aspirin 81 MG tablet Take 81 mg by mouth daily.        Marland Kitchen b complex vitamins tablet Take 1 tablet by mouth 2 (two) times daily.       Marland Kitchen diltiazem (DILACOR XR) 180 MG 24 hr capsule Take 180 mg by mouth daily. Take 180mg  every evening      . fish oil-omega-3 fatty acids 1000 MG capsule Take 1,200 mg by mouth daily.        Marland Kitchen glucosamine-chondroitin 500-400 MG tablet Take 1 tablet by mouth 2 (two) times daily.        Marland Kitchen lisinopril-hydrochlorothiazide (PRINZIDE,ZESTORETIC) 20-12.5 MG per tablet Take 1 tablet by mouth daily. Lisinopril 10 mg at night  30 tablet  5  . LORazepam (ATIVAN) 0.5 MG tablet Take 0.5 mg by mouth daily as needed. When heart races       . metoprolol succinate (TOPROL-XL) 25 MG 24 hr tablet Take 25mg  (1 tablet) in the AM and take 37.5mg  (1 1/2 tablets) in the PM.  75 tablet  6  . Multiple Vitamin (MULTIVITAMIN) tablet Take 1 tablet by mouth daily.        Marland Kitchen NITROSTAT 0.4 MG SL tablet       . rosuvastatin (CRESTOR) 10 MG tablet Take 1 tablet (10 mg total) by mouth every evening.  30 tablet  6  . zaleplon (SONATA) 10 MG capsule Take 10 mg by mouth at bedtime as needed. For sleep      . finasteride (PROSCAR) 5 MG tablet        .  l-methylfolate-B6-B12 (METANX) 3-35-2 MG TABS       . penicillin v potassium (VEETID) 500 MG tablet       . [DISCONTINUED] hyoscyamine (LEVSIN SL) 0.125 MG SL tablet        No current facility-administered medications for this visit.    Allergies as of 03/21/2013 - Review Complete 03/21/2013  Allergen Reaction Noted  . Other  03/29/2012    Vitals: BP 98/62  Pulse 46  Temp(Src) 96 F (35.6 C) (Oral)  Ht 5\' 10"  (1.778 m)  Wt 172 lb (78.019 kg)  BMI 24.68 kg/m2 Last Weight:  Wt Readings from Last 1 Encounters:  03/21/13 172 lb (78.019 kg)   Last Height:   Ht Readings from Last 1 Encounters:  03/21/13 5\' 10"  (1.778 m)   Physical exam:  General: The patient is awake, alert and appears not in acute distress. The patient is well groomed. Head: Normocephalic, atraumatic. Neck is supple. Mallampati 2 , neck circumference:15. No retrognathia.  Cardiovascular:  Regular rate and rhythm , without  murmurs or carotid bruit, and without distended neck veins. Respiratory: Lungs are clear to auscultation. Skin:  Without evidence of edema, or rash Trunk: BMI is  normal posture.  Neurologic exam : The patient is awake and alert, oriented to place and time.  Memory subjective  described as intact. There is a normal attention span & concentration ability.  Speech is fluent without  dysarthria, dysphonia or aphasia. Mood and affect are appropriate.  Cranial nerves: Pupils are equal and briskly reactive to light. Funduscopic exam without evidence of pallor or edema. Extraocular movements  in vertical and horizontal planes intact and without nystagmus. Visual fields by finger perimetry are intact. Hearing to finger rub intact.  Facial sensation intact to fine touch. Facial motor strength is symmetric and tongue and uvula move midline.  Motor exam:   Normal tone and normal muscle bulk and symmetric normal strength in all extremities.  Sensory:  Fine touch, pinprick and vibration were  tested in all extremities. Proprioception is  normal.  Coordination: Rapid alternating movements in the fingers/hands is tested and normal.  Gait and station: Patient walks without assistive device and is able and assisted stool climb up to the exam table.  Deep tendon reflexes: in the  upper and lower extremities are symmetric and intact.   Assessment:  After physical and neurologic examination, review of laboratory studies, imaging, neurophysiology testing and pre-existing records, assessment   OSA is best controlled, there is no further improvement likely.   He has had maximum benefit of apnea treatment  on nocturia. Will follow yearly from now.         Plan:  Treatment plan and additional workup will be reviewed under Problem List.

## 2013-03-21 NOTE — Patient Instructions (Addendum)
CPAP and BIPAP CPAP and BIPAP are methods of helping you breathe. CPAP stands for "continuous positive airway pressure." BIPAP stands for "bi-level positive airway pressure." Both CPAP and BIPAP are provided by a small machine with a flexible plastic tube that attaches to a plastic mask that goes over your nose or mouth. Air is blown into your air passages through your nose or mouth. This helps to keep your airways open and helps to keep you breathing well. The amount of pressure that is used to blow the air into your air passages can be set on the machine. The pressure setting is based on your needs. With CPAP, the amount of pressure stays the same while you breathe in and out. With BIPAP, the amount of pressure changes when you inhale and exhale. Your caregiver will recommend whether CPAP or BIPAP would be more helpful for you.  CPAP and BIPAP can be helpful for both adults and children with:  Sleep apnea.  Chronic Obstructive Pulmonary Disease (COPD), a condition like emphysema.  Diseases which weaken the muscles of the chest such as muscular dystrophy or neurological diseases.  Other problems that cause breathing to be weak or difficult. USE OF CPAP OR BIPAP The respiratory therapist or technician will help you get used to wearing the mask. Some people feel claustrophobic (a trapped or closed in feeling) at first, because the mask needs to be fairly snug on your face.   It may help you to get used to the mask gradually, by first holding the mask loosely over your nose or mouth using a low pressure setting on the machine. Gradually the mask can be applied more snugly with increased pressure. You can also gradually increase the amount of time the mask is used.  People with sleep apnea will use the mask and machine at night when they are sleeping. Others, like those with ALS or other breathing difficulties, may need the CPAP or BIPAP all the time.  If the first mask you try does not fit well, or  is uncomfortable, there are other types and sizes that can be tried.  If you tend to breathe through your mouth, a chin strap may be applied to help keep your mouth closed (if you are using a nasal mask).  The CPAP and BIPAP machines have alarms that may sound if the mask comes off or develops a leak.  You should not eat or drink while the CPAP or BIPAP is on. Food or fluids could get pushed into your lungs by the pressure of the CPAP or BIPAP. Sometimes CPAP or BIPAP machines are ordered for home use. If you are going to use the CPAP or BIPAP machine at home, follow these instructions  CPAP or BIPAP machines can be rented or purchased through home health care companies. There are many different brands of machines available. If you rent a machine before purchasing you may find which particular machine works well for you.  Ask questions if there is something you do not understand when picking out your machine.  Place your CPAP or BIPAP machine on a secure table or stand near an electrical outlet.  Know where the On/Off switch is.  Follow your doctor's instructions for how to set the pressure on your machine and when you should use it.  Do not smoke! Tobacco smoke residue can damage the machine. SEEK IMMEDIATE MEDICAL CARE IF:   You have redness or open areas around your nose or mouth.  You have trouble operating   the CPAP or BIPAP machine.  You cannot tolerate wearing the CPAP or BIPAP mask.  You have any questions or concerns. Document Released: 06/11/2004 Document Revised: 12/06/2011 Document Reviewed: 09/10/2008 Exit[employer] Patient Information 2014 [employer]. Sleep Apnea Sleep apnea is disorder that affects a person's sleep. A person with sleep apnea has abnormal pauses in their breathing when they sleep. It is hard for them to get a good sleep. This makes a person tired during the day. It also can lead to other physical problems. There are three types of sleep apnea. One type is  when breathing stops for a short time because your airway is blocked (obstructive sleep apnea). Another type is when the brain sometimes fails to give the normal signal to breathe to the muscles that control your breathing (central sleep apnea). The third type is a combination of the other two types. HOME CARE  Do not sleep on your back. Try to sleep on your side.  Take all medicine as told by your doctor.  Avoid alcohol, calming medicines (sedatives), and depressant drugs.  Try to lose weight if you are overweight. Talk to your doctor about a healthy weight goal. Your doctor may have you use a device that helps to open your airway. It can help you get the air that you need. It is called a positive airway pressure (PAP) device. There are three types of PAP devices:  Continuous positive airway pressure (CPAP) device.  Nasal expiratory positive airway pressure (EPAP) device.  Bilevel positive airway pressure (BPAP) device. MAKE SURE YOU:  Understand these instructions.  Will watch your condition.  Will get help right away if you are not doing well or get worse. Document Released: 06/22/2008 Document Revised: 08/30/2012 Document Reviewed: 01/15/2012 [employer] Patient Information 2014 [employer].  

## 2013-03-22 ENCOUNTER — Ambulatory Visit (HOSPITAL_COMMUNITY): Payer: BC Managed Care – PPO | Attending: Cardiology | Admitting: Radiology

## 2013-03-22 VITALS — BP 109/68 | HR 46 | Ht 69.0 in | Wt 168.0 lb

## 2013-03-22 DIAGNOSIS — I4949 Other premature depolarization: Secondary | ICD-10-CM

## 2013-03-22 DIAGNOSIS — R000 Tachycardia, unspecified: Secondary | ICD-10-CM | POA: Insufficient documentation

## 2013-03-22 DIAGNOSIS — R079 Chest pain, unspecified: Secondary | ICD-10-CM

## 2013-03-22 DIAGNOSIS — I2510 Atherosclerotic heart disease of native coronary artery without angina pectoris: Secondary | ICD-10-CM

## 2013-03-22 MED ORDER — TECHNETIUM TC 99M SESTAMIBI GENERIC - CARDIOLITE
33.0000 | Freq: Once | INTRAVENOUS | Status: AC | PRN
  Administered 2013-03-22: 33 via INTRAVENOUS

## 2013-03-22 MED ORDER — TECHNETIUM TC 99M SESTAMIBI GENERIC - CARDIOLITE
11.0000 | Freq: Once | INTRAVENOUS | Status: AC | PRN
  Administered 2013-03-22: 11 via INTRAVENOUS

## 2013-03-22 NOTE — Progress Notes (Signed)
[facility] SITE 3 NUCLEAR MED [address] [place], [place] [address] [phone]    Cardiology Nuclear Med Study  Robert [name] is a 67 y.o. male     MRN : [id number]     DOB: 05/21/1946  Procedure Date: 03/22/2013  Nuclear Med Background Indication for Stress Test:  Evaluation for Ischemia History:  '98 Cath:normal coronaries; 5/13 CT:mid RCA calcification>6/13 [id number] Cardiac Risk Factors: Hypertension and Lipids  Symptoms:  Chest Pressure/Tightness (last episode of chest discomfort was about 2-days ago), Diaphoresis and Rapid HR   Nuclear Pre-Procedure Caffeine/Decaff Intake:  None > 12 hrs NPO After: 8:00pm   Lungs:  clear O2 Sat: 98% on room air. IV 0.9% NS with Angio Cath:  20g  IV Site: R Antecubital x 1, tolerated well IV Started by:  Irean Hong, RN  Chest Size (in):  40 Cup Size: n/a  Height: 5\' 9"  (1.753 m)  Weight:  168 lb (76.204 kg)  BMI:  Body mass index is 24.8 kg/(m^2). Tech Comments:  Took Toprol @ 5:00 pm yesterday; took Prinzide this am    Nuclear Med Study 1 or 2 day study: 1 day  Stress Test Type:  Stress  Reading MD: Olga Millers, MD  Order Authorizing Provider:  Peter Swaziland, MD  Resting Radionuclide: Technetium 32m Sestamibi  Resting Radionuclide Dose: 11.0 mCi   Stress Radionuclide:  Technetium 46m Sestamibi  Stress Radionuclide Dose: 33.0 mCi           Stress Protocol Rest HR: 46 Stress HR: 144  Rest BP: 109/68 Stress BP: 175/71  Exercise Time (min): 10:30 METS: 12.5   Predicted Max HR: 154 bpm % Max HR: 93.51 bpm Rate Pressure Product: 46962   Dose of Adenosine (mg):  n/a Dose of Lexiscan: n/a mg  Dose of Atropine (mg): n/a Dose of Dobutamine: n/a mcg/kg/min (at max HR)  Stress Test Technologist: Smiley Houseman, CMA-N  Nuclear Technologist:  Domenic Polite, CNMT     Rest Procedure:  Myocardial perfusion imaging was performed at rest 45 minutes following the intravenous administration of Technetium 19m  Sestamibi.  Rest ECG: Marked sinus bradycardia.  Stress Procedure:  The patient exercised on the treadmill utilizing the Bruce Protocol for 10:30 minutes. The patient stopped due to fatigue and denied any chest pain.  Technetium 21m Sestamibi was injected at peak exercise and myocardial perfusion imaging was performed after a brief delay.  Stress ECG: No significant ST segment change suggestive of ischemia.  QPS Raw Data Images:  Acquisition technically good; normal left ventricular size. Stress Images:  There is decreased uptake in the inferior wall. Rest Images:  There is decreased uptake in the inferior wall. Subtraction (SDS):  No evidence of ischemia. Transient Ischemic Dilatation (Normal <1.22):  0.91 Lung/Heart Ratio (Normal <0.45):  0.39  Quantitative Gated Spect Images QGS EDV:  105 ml QGS ESV:  34 ml  Impression Exercise Capacity:  Good exercise capacity. BP Response:  Normal blood pressure response. Clinical Symptoms:  No chest pain or dyspnea. ECG Impression:  No significant ST segment change suggestive of ischemia. Comparison with Prior Nuclear Study: No previous nuclear study performed  Overall Impression:  Low risk stress nuclear study with a moderate size, medium intensity, fixed inferior defect consistent with diaphragmatic attenuation vs prior infarct; no significant ischemia.  LV Ejection Fraction: 68%.  LV Wall Motion:  NL LV Function; NL Wall Motion  Olga Millers

## 2013-03-23 ENCOUNTER — Other Ambulatory Visit: Payer: Self-pay

## 2013-03-23 ENCOUNTER — Telehealth: Payer: Self-pay | Admitting: Cardiology

## 2013-03-23 MED ORDER — LISINOPRIL-HYDROCHLOROTHIAZIDE 20-12.5 MG PO TABS: 1.0000 | ORAL_TABLET | Freq: Every day | ORAL | Status: DC

## 2013-03-23 NOTE — Telephone Encounter (Signed)
Lisinopril/hctz 20/12.5 mg refill sent to pharmacy.

## 2013-03-23 NOTE — Telephone Encounter (Signed)
Follow up ° ° °Pt states he is returning your call  °

## 2013-03-23 NOTE — Telephone Encounter (Signed)
lisinopril-hydrochlorothiazide (PRINZIDE,ZESTORETIC) 20-12.5 MG per tablet  Take 1 tablet by mouth daily. Lisinopril 10 mg at night  30 tablet  5  Patient Instructions    Continue your current therapy  I will see you in 6 months.     Chart Reviewed By    Charna Elizabeth, LPN on 2/95/6213 11:41 AM

## 2013-04-03 ENCOUNTER — Encounter: Payer: Self-pay | Admitting: Neurology

## 2013-04-28 ENCOUNTER — Other Ambulatory Visit: Payer: Self-pay | Admitting: Cardiology

## 2013-05-18 ENCOUNTER — Ambulatory Visit: Payer: BC Managed Care – PPO | Admitting: Cardiology

## 2013-05-22 ENCOUNTER — Other Ambulatory Visit: Payer: Self-pay | Admitting: Cardiology

## 2013-05-30 ENCOUNTER — Ambulatory Visit (INDEPENDENT_AMBULATORY_CARE_PROVIDER_SITE_OTHER): Payer: BC Managed Care – PPO | Admitting: Cardiology

## 2013-05-30 ENCOUNTER — Encounter: Payer: Self-pay | Admitting: Cardiology

## 2013-05-30 VITALS — BP 114/72 | HR 53 | Ht 69.0 in | Wt 171.8 lb

## 2013-05-30 DIAGNOSIS — I10 Essential (primary) hypertension: Secondary | ICD-10-CM

## 2013-05-30 DIAGNOSIS — E785 Hyperlipidemia, unspecified: Secondary | ICD-10-CM

## 2013-05-30 NOTE — Progress Notes (Signed)
Robert [name] Date of Birth: [birth year]   History of Present Illness: Robert [name] is seen today for followup. He states he is doing very well. He was traveling in [place] this spring when he experienced some palpitations and felt his heart racing. He had some chest discomfort with this. When he returned to the [place] he had a stress Myoview study which was normal. He thinks that his episode was related to the fact that he had not taken his metoprolol yet and had done a lot of exertion that day. He feels fine now. He denies any chest pain, shortness of breath, or palpitations. His blood pressure has been in excellent control.  Current Outpatient Prescriptions on File Prior to Visit  Medication Sig Dispense Refill  . aspirin 81 MG tablet Take 81 mg by mouth daily.        Marland Kitchen diltiazem (CARDIZEM CD) 180 MG 24 hr capsule TAKE ONE CAPSULE BY MOUTH EVERY DAY  30 capsule  3  . finasteride (PROSCAR) 5 MG tablet Take 5 mg by mouth every morning.       . fish oil-omega-3 fatty acids 1000 MG capsule Take 1,200 mg by mouth daily.        Marland Kitchen lisinopril-hydrochlorothiazide (PRINZIDE,ZESTORETIC) 20-12.5 MG per tablet Take 1 tablet by mouth daily. Lisinopril 10 mg at night  30 tablet  6  . LORazepam (ATIVAN) 0.5 MG tablet Take 0.5 mg by mouth daily as needed. When heart races       . metoprolol succinate (TOPROL-XL) 25 MG 24 hr tablet Take 25mg  (1 tablet) in the AM and take 37.5mg  (1 1/2 tablets) in the PM.  75 tablet  6  . Multiple Vitamin (MULTIVITAMIN) tablet Take 1 tablet by mouth daily.        Marland Kitchen NITROSTAT 0.4 MG SL tablet Place 0.4 mg under the tongue every 5 (five) minutes as needed.       . rosuvastatin (CRESTOR) 10 MG tablet Take 1 tablet (10 mg total) by mouth every evening.  30 tablet  6  . zaleplon (SONATA) 10 MG capsule Take 10 mg by mouth at bedtime as needed. For sleep      . l-methylfolate-B6-B12 (METANX) 3-35-2 MG TABS Vitamin B complex      . [DISCONTINUED] hyoscyamine (LEVSIN SL) 0.125 MG  SL tablet        No current facility-administered medications on file prior to visit.    Allergies  Allergen Reactions  . Other     Allergic to dye used when you give blood. Says its an orange dye    Past Medical History  Diagnosis Date  . Hypertension   . Hyperlipidemia   . Colon cancer     s/p colon resection in 2004  . S/P cardiac catheterization 1995 and 1998    following abnormal stress tests. Both reportedly normal.   . Anxiety   . Neuropathy     SECONDARY TO CHEMOTHERAPY  . Sleep apnea with use of continuous positive airway pressure (CPAP)     08-16-12  AHi 18.5  78% nadir, titratet to 6 cm with AHi of 4 .   Marland Kitchen Nocturia associated with benign prostatic hypertrophy 03/21/2013    Past Surgical History  Procedure Laterality Date  . Cardiac catheterization  07/04/1997  . Colon resection  2004  . L4 discectomy    . Elbow surgery Left 2011    INFECTED OLECRANON BURSITIS SURGERY  . Transurethral resection of prostate    . Retinal detachment  surgery  1998  . Septic joint      History  Smoking status  . Never Smoker   Smokeless tobacco  . Not on file    History  Alcohol Use  . 1.1 oz/week  . 1 Glasses of wine, 1 Drinks containing 0.5 oz of alcohol per week    Comment: each night    Family History  Problem Relation Age of Onset  . Emphysema Mother   . Heart attack Father   . Hypertension Father   . Hypertension Other   . High Cholesterol Other     Review of Systems: All other systems are reviewed and are negative. Physical Exam: BP 114/72  Pulse 53  Ht 5\' 9"  (1.753 m)  Wt 171 lb 12.8 oz (77.928 kg)  BMI 25.36 kg/m2  SpO2 98% He is a pleasant white male in no acute distress. His HEENT exam is unremarkable. He has no JVD or bruits. Lungs are clear. Cardiac exam reveals a regular rate and rhythm without gallop, murmur, or click. Abdomen is soft and nontender. He has no edema. Pedal pulses are good.  LABORATORY DATA: Cardiology Nuclear Med Study   Robert [name] is a 67 y.o. male MRN : [id number] DOB: Apr 15, 1946  Procedure Date: 03/22/2013  Nuclear Med Background  Indication for Stress Test: Evaluation for Ischemia  History: '98 Cath:normal coronaries; 5/13 CT:mid RCA calcification>6/13 [id number]:[id number]  Cardiac Risk Factors: Hypertension and Lipids  Symptoms: Chest Pressure/Tightness (last episode of chest discomfort was about 2-days ago), Diaphoresis and Rapid HR  Nuclear Pre-Procedure  Caffeine/Decaff Intake: None > 12 hrs  NPO After: 8:00pm   Lungs: clear  O2 Sat: 98% on room air.  IV 0.9% NS with Angio Cath: 20g   IV Site: R Antecubital x 1, tolerated well  IV Started by: Irean Hong, RN   Chest Size (in): 40  Cup Size: n/a   Height: 5\' 9"  (1.753 m)  Weight: 168 lb (76.204 kg)   BMI: Body mass index is 24.8 kg/(m^2).  Tech Comments: Took Toprol @ 5:00 pm yesterday; took Prinzide this am   Nuclear Med Study  1 or 2 day study: 1 day  Stress Test Type: Stress   Reading MD: Olga Millers, MD  Order Authorizing Provider: Laryn Venning [place], MD   Resting Radionuclide: Technetium 49m Sestamibi  Resting Radionuclide Dose: 11.0 mCi   Stress Radionuclide: Technetium 77m Sestamibi  Stress Radionuclide Dose: 33.0 mCi   Stress Protocol  Rest HR: 46  Stress HR: 144   Rest BP: 109/68  Stress BP: 175/71   Exercise Time (min): 10:30  METS: 12.5   Predicted Max HR: 154 bpm  % Max HR: 93.51 bpm  Rate Pressure Product: 86578  Dose of Adenosine (mg): n/a  Dose of Lexiscan: n/a mg   Dose of Atropine (mg): n/a  Dose of Dobutamine: n/a mcg/kg/min (at max HR)   Stress Test Technologist: Smiley Houseman, CMA-N  Nuclear Technologist: Domenic Polite, CNMT   Rest Procedure: Myocardial perfusion imaging was performed at rest 45 minutes following the intravenous administration of Technetium 77m Sestamibi.  Rest ECG: Marked sinus bradycardia.  Stress Procedure: The patient exercised on the treadmill utilizing the Bruce Protocol for 10:30 minutes. The patient  stopped due to fatigue and denied any chest pain. Technetium 90m Sestamibi was injected at peak exercise and myocardial perfusion imaging was performed after a brief delay.  Stress ECG: No significant ST segment change suggestive of ischemia.  QPS  Raw Data Images: Acquisition technically good; normal  left ventricular size.  Stress Images: There is decreased uptake in the inferior wall.  Rest Images: There is decreased uptake in the inferior wall.  Subtraction (SDS): No evidence of ischemia.  Transient Ischemic Dilatation (Normal <1.22): 0.91  Lung/Heart Ratio (Normal <0.45): 0.39  Quantitative Gated Spect Images  QGS EDV: 105 ml  QGS ESV: 34 ml  Impression  Exercise Capacity: Good exercise capacity.  BP Response: Normal blood pressure response.  Clinical Symptoms: No chest pain or dyspnea.  ECG Impression: No significant ST segment change suggestive of ischemia.  Comparison with Prior Nuclear Study: No previous nuclear study performed  Overall Impression: Low risk stress nuclear study with a moderate size, medium intensity, fixed inferior defect consistent with diaphragmatic attenuation vs prior infarct; no significant ischemia.  LV Ejection Fraction: 68%. LV Wall Motion: NL LV Function; NL Wall Motion  Olga Millers      Assessment / Plan: 1. Hypertension. Blood pressure is under excellent control currently.   2. Hyperlipidemia, controlled on Crestor and fish oil.  3. Coronary calcification noted incidentally on a CT scan. This was in the mid RCA distribution. Recent stress Myoview study was negative for ischemia. Ejection fraction was normal.  4. Obstructive sleep apnea now on CPAP.

## 2013-05-30 NOTE — Patient Instructions (Signed)
Continue your current therapy  I will see you in 6 months.   

## 2013-06-04 ENCOUNTER — Encounter: Payer: Self-pay | Admitting: Cardiology

## 2013-06-08 ENCOUNTER — Encounter: Payer: Self-pay | Admitting: Neurology

## 2013-06-08 ENCOUNTER — Encounter: Payer: Self-pay | Admitting: Cardiology

## 2013-08-02 ENCOUNTER — Other Ambulatory Visit: Payer: Self-pay

## 2013-08-15 ENCOUNTER — Other Ambulatory Visit: Payer: Self-pay

## 2013-08-15 MED ORDER — METOPROLOL SUCCINATE ER 25 MG PO TB24: ORAL_TABLET | ORAL | Status: DC

## 2013-09-22 ENCOUNTER — Other Ambulatory Visit: Payer: Self-pay | Admitting: Cardiology

## 2013-10-19 ENCOUNTER — Telehealth: Payer: Self-pay | Admitting: Cardiology

## 2013-10-19 NOTE — Telephone Encounter (Signed)
I would increase metoprolol as suggested and see how he does.  Francille Wittmann Martinique MD, [facility]

## 2013-10-19 NOTE — Telephone Encounter (Signed)
Returned call to patient he stated he is having problems with B/P again.Stated his B/P normally is 105/65 pulse 55 to 60.Stated for the last week or more B/P 138/65 pulse 75 to 85 and he feels uncomfortable.Stated he feels his heart beating when he lies down at night.Stated for the past 2 nights he increased toprol to 50 mg and it did not help.Stated he would like Dr.Jordan to know and wants to know if he could take toprol 25 mg 11/2 tablets twice a day or should increase diltiazem.Message sent to [place] for advice.

## 2013-10-19 NOTE — Telephone Encounter (Signed)
New messages     Want to review medications with nurse

## 2013-10-20 ENCOUNTER — Telehealth: Payer: Self-pay | Admitting: Internal Medicine

## 2013-10-20 NOTE — Telephone Encounter (Signed)
Mr Grabe called because his heart rate had been around 100 bpm while he was sitting in his chair. His blood pressure was elevated for him, around 224 systolic so he took his afternoon medications early around 330. By 4 pm his blood pressure and heart rate were not any better. He is concerned because he has a family history of CAD and is worried he's having a heart attack. He repeated his vitals. His blood pressure is still around 825 systolic and heart rate around 100 but regular. He feels off. He is not having chest discomfort. I told him he could try to take additional 25mg  of metoprolol and recheck vitals in 90 minutes. If they are still concerning to him and he doesn't feel right he should come for [place] to check him out. I told him not to drive if he feels lightheaded or dizzy. He was ok with this plan.   Jerilee Hoh, MD

## 2013-10-21 ENCOUNTER — Telehealth: Payer: Self-pay | Admitting: Physician Assistant

## 2013-10-21 NOTE — Telephone Encounter (Signed)
Patient spoke to fellow last night b/c of high HR.  He took extra Toprol 25 last night. He got mixed up and took Dilt this AM (usually takes in PM) and took Toprol 37.5 (usually takes 25). I reassured him this was ok. He can continue taking Diltiazem QAM. I advised him to change his Toprol to 37.5 bid until he is evaluated again by Dr. Peter Martinique. He agree. Richardson Dopp, PA-C   10/21/2013 12:53 PM

## 2013-10-23 ENCOUNTER — Encounter: Payer: Self-pay | Admitting: Nurse Practitioner

## 2013-10-23 ENCOUNTER — Ambulatory Visit (INDEPENDENT_AMBULATORY_CARE_PROVIDER_SITE_OTHER): Payer: BC Managed Care – PPO | Admitting: Nurse Practitioner

## 2013-10-23 VITALS — BP 110/69 | HR 65 | Ht 69.0 in | Wt 168.0 lb

## 2013-10-23 DIAGNOSIS — E785 Hyperlipidemia, unspecified: Secondary | ICD-10-CM

## 2013-10-23 DIAGNOSIS — I10 Essential (primary) hypertension: Secondary | ICD-10-CM

## 2013-10-23 DIAGNOSIS — R002 Palpitations: Secondary | ICD-10-CM

## 2013-10-23 NOTE — Progress Notes (Signed)
Patient Name: Robert [name] Date of Encounter: 10/23/2013  Primary Care Provider:  Aviva Signs, MD Primary Cardiologist:  P. Martinique, MD   Patient Profile  68 year old male with history of hypertension hyperlipidemia who presents secondary to recent episodes of lightheadedness and palpitations.  Problem List   Past Medical History  Diagnosis Date  . Hypertension   . Hyperlipidemia   . Colon cancer     a. s/p colon resection in 2004  . S/P cardiac catheterization 1995 and 1998    a. following abnormal stress tests. Both reportedly normal;  b. 02/2013 nl myoview.  . Anxiety   . Neuropathy     SECONDARY TO CHEMOTHERAPY  . Sleep apnea with use of continuous positive airway pressure (CPAP)     08-16-12  AHi 18.5  78% nadir, titratet to 6 cm with AHi of 4 .   Marland Kitchen Nocturia associated with benign prostatic hypertrophy 03/21/2013  . Palpitations    Past Surgical History  Procedure Laterality Date  . Cardiac catheterization  07/04/1997  . Colon resection  2004  . L4 discectomy    . Elbow surgery Left 2011    INFECTED OLECRANON BURSITIS SURGERY  . Transurethral resection of prostate    . Retinal detachment surgery  1998  . Septic joint      Allergies  Allergies  Allergen Reactions  . Other     Allergic to dye used when you give blood. Says its an orange dye    HPI  68 year old male with the above problem list.  Over the past month or so, he reports feeling not quite right.  He is unable to be too much more specific than that but says that he has been having intermittent lightheadedness and also spells of palpitations where she feels as though he is on a treadmill.  He does check his blood pressure and heart rate frequently during these episodes, his heart rate is often in the 80s to 90s with a normal blood pressure.  He has not been having any chest pain or dyspnea he continues to exercise 2-3 times per week without limitations in activity.  He denies PND, orthopnea, syncope, or  edema.  He keeps very diligent in detail the records of his heart rates and blood pressures and on some type days will check these up to 10-20 times per day.  Over this past weekend, he was feeling as though his heart rate was up during rest and he recorded rates in the 80's to low 100's.  He called the on-call staff report this, and was advised to take an extra half of metoprolol.  He has since increased his Toprol dose to 37.5 mg b.i.d.  He thinks he's been having less palpitations but continues to feel "off."  Home Medications  Prior to Admission medications   Medication Sig Start Date End Date Taking? Authorizing Provider  aspirin 81 MG tablet Take 81 mg by mouth daily.     Yes Historical Provider, MD  diltiazem (CARDIZEM CD) 180 MG 24 hr capsule TAKE ONE CAPSULE BY MOUTH EVERY DAY 09/22/13  Yes Peter M Martinique, MD  finasteride (PROSCAR) 5 MG tablet Take 5 mg by mouth every morning.  02/26/13  Yes Historical Provider, MD  fish oil-omega-3 fatty acids 1000 MG capsule Take 1,200 mg by mouth daily.     Yes Historical Provider, MD  l-methylfolate-B6-B12 (METANX) 3-35-2 MG TABS Take 1 tablet by mouth 2 (two) times daily. Vitamin B complex 02/22/13  Yes Historical  Provider, MD  lisinopril-hydrochlorothiazide (PRINZIDE,ZESTORETIC) 20-12.5 MG per tablet Take 1 tablet by mouth daily. Lisinopril 10 mg at night 03/23/13  Yes Peter M Martinique, MD  LORazepam (ATIVAN) 0.5 MG tablet Take 0.5 mg by mouth daily as needed. When heart races    Yes Historical Provider, MD  metoprolol succinate (TOPROL XL) 25 MG 24 hr tablet Take by mouth daily. Take 1 and 1/2 tablets twice daily 10/23/13  Yes Peter M Martinique, MD  Multiple Vitamin (MULTIVITAMIN) tablet Take 1 tablet by mouth daily.     Yes Historical Provider, MD  NITROSTAT 0.4 MG SL tablet Place 0.4 mg under the tongue every 5 (five) minutes as needed.  03/31/12  Yes Historical Provider, MD  NON FORMULARY CPAP MACHINE   Yes Historical Provider, MD  rosuvastatin (CRESTOR) 10  MG tablet Take 1 tablet (10 mg total) by mouth every evening. 02/08/13  Yes Peter M Martinique, MD  zaleplon (SONATA) 10 MG capsule Take 10 mg by mouth at bedtime as needed. For sleep 04/18/11  Yes Historical Provider, MD    Review of Systems  Palpitations and "feeling not quite right" over the past month.  He has urinary frequency and nocturia.  All other systems reviewed and are otherwise negative except as noted above.  Physical Exam  Blood pressure 110/69, pulse 65, height 5\' 9"  (1.753 m), weight 168 lb (76.204 kg).  General: Pleasant, NAD Psych: Normal affect. Neuro: Alert and oriented X 3. Moves all extremities spontaneously. HEENT: Normal  Neck: Supple without bruits or JVD. Lungs:  Resp regular and unlabored, CTA. Heart: RRR no s3, s4, or murmurs. Abdomen: Soft, non-tender, non-distended, BS + x 4.  Extremities: No clubbing, cyanosis or edema. DP/PT/Radials 2+ and equal bilaterally.  Accessory Clinical Findings  ECG - regular sinus rhythm, poor R wave progression, no acute ST-T changes.  Assessment & Plan  1.  Palpitations: Over the past month, the patient has been feeling "off."  A large part of this as feeling as though his resting heart rates are about his norm.  He says that his norm is usually in the 60s and he is sometimes feeling poorly when any 80s to low 100s while at rest.  The symptoms are very concerning to him.  He has not had any sustained tachycardia but does experience presyncope during spells of slightly elevated heart rates.  I will check a basic metabolic profile, magnesium, and TSH today.  I have offered him an event monitor so that we can capture his rhythm during these episodes.  I suspect that he is in sinus rhythm and that perhaps anxiety is playing a role however an event monitor would help to sort this out further.  He would like to see the results of the blood work prior to placing a monitor and this seems reasonable.  He will remain on his current dose of beta  blocker therapy which is 37.5 mg b.i.d. And he also remains on diltiazem.  2.  Hypertension: This is a very stable.  He continues to follow this closely at home.  Continue current regimen.  3.  Disposition: Lab work and monitor as above.  Followup with Dr. Martinique in one month.  Murray Hodgkins, NP 10/23/2013, 4:53 PM

## 2013-10-23 NOTE — Patient Instructions (Signed)
Your physician recommends that you return for lab work in: today   Your physician has recommended that you wear an 21 day event monitor. Event monitors are medical devices that record the heart's electrical activity. Doctors most often [place] these monitors to diagnose arrhythmias. Arrhythmias are problems with the speed or rhythm of the heartbeat. The monitor is a small, portable device. You can wear one while you do your normal daily activities. This is usually used to diagnose what is causing palpitations/syncope (passing out).    Your physician recommends that you schedule a follow-up appointment in: 1 month with Dr Martinique   Your physician recommends that you continue on your current medications as directed. Please refer to the Current Medication list given to you today.

## 2013-10-23 NOTE — Telephone Encounter (Signed)
Returned call to patient Dr.Jordan advised ok to increase metoprolol 25 mg 1 1/2 twice a day.Patient stated he has appointment with Ignacia Bayley this afternoon.

## 2013-10-23 NOTE — Addendum Note (Signed)
Addended by: Golden Hurter D on: 10/23/2013 03:21 PM   Modules accepted: Orders, Medications

## 2013-10-24 LAB — TSH: TSH: 1.13 u[IU]/mL (ref 0.35–5.50)

## 2013-10-24 LAB — BASIC METABOLIC PANEL
BUN: 26 mg/dL — AB (ref 6–23)
CALCIUM: 10.4 mg/dL (ref 8.4–10.5)
CHLORIDE: 100 meq/L (ref 96–112)
CO2: 29 meq/L (ref 19–32)
CREATININE: 1 mg/dL (ref 0.4–1.5)
GFR: 77.49 mL/min (ref >60.00)
GLUCOSE: 91 mg/dL (ref 70–99)
Potassium: 4.1 mEq/L (ref 3.5–5.1)
Sodium: 135 mEq/L (ref 135–145)

## 2013-10-24 LAB — MAGNESIUM: Magnesium: 2.1 mg/dL (ref 1.5–2.5)

## 2013-11-01 ENCOUNTER — Encounter: Payer: Self-pay | Admitting: *Deleted

## 2013-11-01 ENCOUNTER — Other Ambulatory Visit: Payer: Self-pay | Admitting: Cardiology

## 2013-11-01 ENCOUNTER — Encounter (INDEPENDENT_AMBULATORY_CARE_PROVIDER_SITE_OTHER): Payer: BC Managed Care – PPO

## 2013-11-01 DIAGNOSIS — I10 Essential (primary) hypertension: Secondary | ICD-10-CM

## 2013-11-01 DIAGNOSIS — E785 Hyperlipidemia, unspecified: Secondary | ICD-10-CM

## 2013-11-01 DIAGNOSIS — R002 Palpitations: Secondary | ICD-10-CM

## 2013-11-01 NOTE — Progress Notes (Signed)
Patient ID: Robert [name], male   DOB: 02/18/1946, 68 y.o.   MRN: [id number] [employer] 21 day cardiac event monitor applied to patient.

## 2013-11-05 ENCOUNTER — Other Ambulatory Visit: Payer: Self-pay

## 2013-11-05 MED ORDER — METOPROLOL SUCCINATE ER 25 MG PO TB24: 25.0000 mg | ORAL_TABLET | Freq: Every day | ORAL | Status: DC

## 2013-11-15 ENCOUNTER — Encounter: Payer: Self-pay | Admitting: Nurse Practitioner

## 2013-11-29 ENCOUNTER — Telehealth: Payer: Self-pay

## 2013-11-29 NOTE — Telephone Encounter (Signed)
Spoke to patient 11/19/13 he stated he noticed when he drinks too much alcohol at night he has increased heart rate,wanted to know if Dr.Jordan could change medication to control heart rate.Advised to decrease alcohol,continue wearing event monitor,keep appointment with Dr.Jordan 12/20/13.

## 2013-11-30 ENCOUNTER — Telehealth: Payer: Self-pay

## 2013-11-30 NOTE — Telephone Encounter (Signed)
Patient called Dr.Jordan reviewed event monitor which revealed normal sinus rhythm.Stated he had to decrease metoprolol to 25 mg in am,25 mg 1 1/2 tablets in pm.Advised to keep appointment with Dr.Jordan 12/20/13.

## 2013-12-03 ENCOUNTER — Other Ambulatory Visit: Payer: Self-pay | Admitting: Cardiology

## 2013-12-20 ENCOUNTER — Ambulatory Visit (INDEPENDENT_AMBULATORY_CARE_PROVIDER_SITE_OTHER): Payer: BC Managed Care – PPO | Admitting: Cardiology

## 2013-12-20 ENCOUNTER — Encounter: Payer: Self-pay | Admitting: Cardiology

## 2013-12-20 VITALS — BP 111/60 | HR 67 | Ht 69.0 in | Wt 167.8 lb

## 2013-12-20 DIAGNOSIS — R002 Palpitations: Secondary | ICD-10-CM

## 2013-12-20 DIAGNOSIS — E785 Hyperlipidemia, unspecified: Secondary | ICD-10-CM

## 2013-12-20 DIAGNOSIS — I10 Essential (primary) hypertension: Secondary | ICD-10-CM

## 2013-12-20 NOTE — Patient Instructions (Signed)
Continue your current medication  I will see you in 6 months.   

## 2013-12-20 NOTE — Progress Notes (Signed)
Robert [name] Date of Birth: December 03, 1945   History of Present Illness: Robert [name] is seen today for followup. He saw Robert [name] recently with some vague symptoms of tingling and "pings" in his chest. An event monitor showed normal rhythm.Electrolytes were normal. He was having some episodes of faster HR up to a max of 100 bpm. He noticed a linear correlation with the amount of alcohol he consumed. He has reduced his Etoh intake. Overall feels well but doesn't want to miss anything.   Current Outpatient Prescriptions on File Prior to Visit  Medication Sig Dispense Refill  . aspirin 81 MG tablet Take 81 mg by mouth daily.        Marland Kitchen diltiazem (CARDIZEM CD) 180 MG 24 hr capsule TAKE ONE CAPSULE BY MOUTH EVERY DAY  30 capsule  3  . finasteride (PROSCAR) 5 MG tablet Take 5 mg by mouth every morning.       . fish oil-omega-3 fatty acids 1000 MG capsule Take 1,200 mg by mouth daily.        Marland Kitchen l-methylfolate-B6-B12 (METANX) 3-35-2 MG TABS Take 1 tablet by mouth 2 (two) times daily. Vitamin B complex      . lisinopril-hydrochlorothiazide (PRINZIDE,ZESTORETIC) 20-12.5 MG per tablet Take 1 tablet by mouth daily. Lisinopril 10 mg at night  30 tablet  6  . LORazepam (ATIVAN) 0.5 MG tablet Take 0.5 mg by mouth daily as needed. When heart races       . metoprolol succinate (TOPROL XL) 25 MG 24 hr tablet Take 1 tablet (25 mg total) by mouth daily. Take 1 and 1/2 tablets twice daily  90 tablet  6  . Multiple Vitamin (MULTIVITAMIN) tablet Take 1 tablet by mouth daily.        Marland Kitchen NITROSTAT 0.4 MG SL tablet Place 0.4 mg under the tongue every 5 (five) minutes as needed.       . NON FORMULARY CPAP MACHINE      . rosuvastatin (CRESTOR) 10 MG tablet Take 1 tablet (10 mg total) by mouth every evening.  30 tablet  6  . zaleplon (SONATA) 10 MG capsule Take 10 mg by mouth at bedtime as needed. For sleep      . [DISCONTINUED] hyoscyamine (LEVSIN SL) 0.125 MG SL tablet        No current facility-administered medications  on file prior to visit.    Allergies  Allergen Reactions  . Other     Allergic to dye used when you give blood. Says its an orange dye    Past Medical History  Diagnosis Date  . Hypertension   . Hyperlipidemia   . Colon cancer     a. s/p colon resection in 2004  . S/P cardiac catheterization 1995 and 1998    a. following abnormal stress tests. Both reportedly normal;  b. 02/2013 nl myoview.  . Anxiety   . Neuropathy     SECONDARY TO CHEMOTHERAPY  . Sleep apnea with use of continuous positive airway pressure (CPAP)     08-16-12  AHi 18.5  78% nadir, titratet to 6 cm with AHi of 4 .   Marland Kitchen Nocturia associated with benign prostatic hypertrophy 03/21/2013  . Palpitations     Past Surgical History  Procedure Laterality Date  . Cardiac catheterization  07/04/1997  . Colon resection  2004  . L4 discectomy    . Elbow surgery Left 2011    INFECTED OLECRANON BURSITIS SURGERY  . Transurethral resection of prostate    . Retinal  detachment surgery  1998  . Septic joint      History  Smoking status  . Never Smoker   Smokeless tobacco  . Not on file    History  Alcohol Use  . 1.1 oz/week  . 1 Glasses of wine, 1 Drinks containing 0.5 oz of alcohol per week    Comment: each night    Family History  Problem Relation Age of Onset  . Emphysema Mother   . Heart attack Father   . Hypertension Father   . Hypertension Other   . High Cholesterol Other     Review of Systems: All other systems are reviewed and are negative. Physical Exam: BP 111/60  Pulse 67  Ht 5\' 9"  (1.753 m)  Wt 167 lb 12.8 oz (76.114 kg)  BMI 24.77 kg/m2 He is a pleasant white male in no acute distress. His HEENT exam is unremarkable. He has no JVD or bruits. Lungs are clear. Cardiac exam reveals a regular rate and rhythm without gallop, murmur, or click. Abdomen is soft and nontender. He has no edema. Pedal pulses are good.  LABORATORY DATA: Cardiology Nuclear Med Study  Robert [name] is a 68 y.o.  male MRN : [id number] DOB: November 13, 1945  Procedure Date: 03/22/2013  Nuclear Med Background  Indication for Stress Test: Evaluation for Ischemia  History: '98 Cath:normal coronaries; 5/13 CT:mid RCA calcification>6/13 DUK:GURKYH  Cardiac Risk Factors: Hypertension and Lipids  Symptoms: Chest Pressure/Tightness (last episode of chest discomfort was about 2-days ago), Diaphoresis and Rapid HR  Nuclear Pre-Procedure  Caffeine/Decaff Intake: None > 12 hrs  NPO After: 8:00pm   Lungs: clear  O2 Sat: 98% on room air.  IV 0.9% NS with Angio Cath: 20g   IV Site: R Antecubital x 1, tolerated well  IV Started by: Irven Baltimore, RN   Chest Size (in): 40  Cup Size: n/a   Height: 5\' 9"  (1.753 m)  Weight: 168 lb (76.204 kg)   BMI: Body mass index is 24.8 kg/(m^2).  Tech Comments: Took Toprol @ 5:00 pm yesterday; took Prinzide this am   Nuclear Med Study  1 or 2 day study: 1 day  Stress Test Type: Stress   Reading MD: Kirk Ruths, MD  Order Authorizing Provider: Waldine Zenz [place], MD   Resting Radionuclide: Technetium 58m Sestamibi  Resting Radionuclide Dose: 11.0 mCi   Stress Radionuclide: Technetium 66m Sestamibi  Stress Radionuclide Dose: 33.0 mCi   Stress Protocol  Rest HR: 46  Stress HR: 144   Rest BP: 109/68  Stress BP: 175/71   Exercise Time (min): 10:30  METS: 12.5   Predicted Max HR: 154 bpm  % Max HR: 93.51 bpm  Rate Pressure Product: 25200  Dose of Adenosine (mg): n/a  Dose of Lexiscan: n/a mg   Dose of Atropine (mg): n/a  Dose of Dobutamine: n/a mcg/kg/min (at max HR)   Stress Test Technologist: Letta Moynahan, CMA-N  Nuclear Technologist: Charlton Amor, CNMT   Rest Procedure: Myocardial perfusion imaging was performed at rest 45 minutes following the intravenous administration of Technetium 77m Sestamibi.  Rest ECG: Marked sinus bradycardia.  Stress Procedure: The patient exercised on the treadmill utilizing the Bruce Protocol for 10:30 minutes. The patient stopped due to fatigue and denied  any chest pain. Technetium 22m Sestamibi was injected at peak exercise and myocardial perfusion imaging was performed after a brief delay.  Stress ECG: No significant ST segment change suggestive of ischemia.  QPS  Raw Data Images: Acquisition technically good; normal left ventricular  size.  Stress Images: There is decreased uptake in the inferior wall.  Rest Images: There is decreased uptake in the inferior wall.  Subtraction (SDS): No evidence of ischemia.  Transient Ischemic Dilatation (Normal <1.22): 0.91  Lung/Heart Ratio (Normal <0.45): 0.39  Quantitative Gated Spect Images  QGS EDV: 105 ml  QGS ESV: 34 ml  Impression  Exercise Capacity: Good exercise capacity.  BP Response: Normal blood pressure response.  Clinical Symptoms: No chest pain or dyspnea.  ECG Impression: No significant ST segment change suggestive of ischemia.  Comparison with Prior Nuclear Study: No previous nuclear study performed  Overall Impression: Low risk stress nuclear study with a moderate size, medium intensity, fixed inferior defect consistent with diaphragmatic attenuation vs prior infarct; no significant ischemia.  LV Ejection Fraction: 68%. LV Wall Motion: NL LV Function; NL Wall Motion  Kirk Ruths    Event monitory: NSR. No arrhythmia.  Assessment / Plan: 1. Hypertension. Blood pressure is under excellent control currently.   2. Hyperlipidemia, controlled on Crestor and fish oil.  3. Coronary calcification noted incidentally on a CT scan. This was in the mid RCA distribution. Stress Myoview study was negative for ischemia. Ejection fraction was normal.  4. Obstructive sleep apnea now on CPAP.  5. Vague atypical palpitations. Monitor unremarkable. Curtail Etoh intake. Follow up in 6 months.

## 2014-01-01 ENCOUNTER — Other Ambulatory Visit: Payer: Self-pay | Admitting: Cardiology

## 2014-01-02 ENCOUNTER — Other Ambulatory Visit: Payer: Self-pay | Admitting: Cardiology

## 2014-01-18 ENCOUNTER — Other Ambulatory Visit: Payer: Self-pay | Admitting: Cardiology

## 2014-03-06 ENCOUNTER — Other Ambulatory Visit: Payer: Self-pay

## 2014-03-06 MED ORDER — LISINOPRIL-HYDROCHLOROTHIAZIDE 20-12.5 MG PO TABS: 1.0000 | ORAL_TABLET | Freq: Every day | ORAL | Status: DC

## 2014-03-20 ENCOUNTER — Ambulatory Visit (INDEPENDENT_AMBULATORY_CARE_PROVIDER_SITE_OTHER): Payer: BC Managed Care – PPO | Admitting: Neurology

## 2014-03-20 ENCOUNTER — Encounter: Payer: Self-pay | Admitting: Neurology

## 2014-03-20 VITALS — BP 95/61 | HR 60 | Ht 70.0 in | Wt 172.0 lb

## 2014-03-20 DIAGNOSIS — R351 Nocturia: Secondary | ICD-10-CM

## 2014-03-20 DIAGNOSIS — Z9989 Dependence on other enabling machines and devices: Principal | ICD-10-CM

## 2014-03-20 DIAGNOSIS — G4733 Obstructive sleep apnea (adult) (pediatric): Secondary | ICD-10-CM

## 2014-03-20 NOTE — Progress Notes (Signed)
[facility]  Provider:  Dr [name] Referring Provider: [name], MD Primary Care Physician:  Robert Redwood, MD  Chief Complaint  Patient presents with  . Follow-up    Room 11  . Sleep Apnea     HPI:  Robert [name] is a 68 y.o. male here as a  revisit  For Insomnia and OSA , referred originally  from Robert. Jasmine [name] , Urologist . Robert.  Brigitte [name] is this patient's  PCP.  Patient uses CPAP at 4 to 8 cm water autoset with a residual AHI of 1.7 and average user time 5 hours 27 minutes. Low air leak, 90 days of 93% compliance. He still has nocturia,  TURP procedure 1995, Epworth 5 and FSS 30 points,  Patient has found a 'cocktail " that helps him to sleep , using an antihistamine along with prescription sleep aid.    Last visit note;  Robert [name]  begun having fragmented sleep and finally  would wake up 5-7 times at night to relieve himself. He tried Flomax without success and then by the recently changed urologists and 2013. Robert. Jasmine [name] finally suspected that sleep apnea may be a contributing cause. He also had seen a dentist about the same time noticed him falling asleep in the dental chair and urgent to get an workup for obstructive sleep apnea. Robert. Brigitte [name], his primary care physician ordered a sleep study and the patient tested positive for apnea he was titrated to CPAP in a split night procedure the study was performed. The split night procedure took place on 08-16-12 the patient's BMI at that time was 25 his Epworth sleepiness score 4/24 points his neck circumference 15-1/2 inches. He and was to exhalatory at one point. Apnea hypotony index was 18.5 and RDI 18.9 there was no strong REM complement the supine complement. Lowest desaturation was 78% supine REM sleep was 32.8 minutes of desaturation.  The patient was titrated to 6 cm water pressure as an AHI of less than 5.  He then underwent a  Auto-  titration and compliance report through the [name] 2013 into January  2040,  he used his machine 6 hours 30 minutes at night was 100% compliant , his  AHI  was 2.9 in late January 2014 .   Review of Systems: Out of a complete 14 system review, the patient complains of only the following symptoms, and all other reviewed systems are negative. Nocturia  is down  from 6-7 times at night to 3 times.  Epworth is  4 Epworth Sleepiness Scale today is score today is 4 points, and  FSS 29 points, and GDS  depression score zero  Point.  No longer nightmares , reduced Nocturia.    History   Social History  . Marital Status: Married    Spouse Name: Robert [name]    Number of Children: 3  . Years of Education: Ph.D   Occupational History  . ADMIN     [employer]   Social History Main Topics  . Smoking status: Never Smoker   . Smokeless tobacco: Never Used  . Alcohol Use: 1.1 oz/week    1 Glasses of wine, 1 Drinks containing 0.5 oz of alcohol per week     Comment: each night  . Drug Use: No  . Sexual Activity: Yes   Other Topics Concern  . Not on file   Social History Narrative   Sleep apnea 28 AHI was reduced to 3.4- set to  7 cm water pressure on nasal mask  eson or wisp.  He sleep  nonsupine, less nocturia.  Tennis ball method explained.  Patient does not want machine set to a certain pressure.     Downloaded CMS compliance s etsablsihed .  Insomnia not improved further - will try seroquel as  medication approach and discussed behaviour therapy approach .   send for n beahiour therapy and started trial of SEROQUEL>  25 minute visit including CMS compliance and downloading.    Patient is married Robert [name]) and lives at home with his wife.   Patient has three children and his wife has three children.   Patient is working full-time.   Patient has a Ph.D.   Patient is right-handed.   Patient drinks about 21 oz of coffee daily.          Family History  Problem Relation Age of Onset  . Emphysema Mother   . Heart attack Father   . Hypertension Father   . Hypertension  Other   . High Cholesterol Other     Past Medical History  Diagnosis Date  . Hypertension   . Hyperlipidemia   . Colon cancer     a. s/p colon resection in 2004  . S/P cardiac catheterization 1995 and 1998    a. following abnormal stress tests. Both reportedly normal;  b. 02/2013 nl myoview.  . Anxiety   . Neuropathy     SECONDARY TO CHEMOTHERAPY  . Sleep apnea with use of continuous positive airway pressure (CPAP)     08-16-12  AHi 18.5  78% nadir, titratet to 6 cm with AHi of 4 .   Marland Kitchen Nocturia associated with benign prostatic hypertrophy 03/21/2013  . Palpitations     Past Surgical History  Procedure Laterality Date  . Cardiac catheterization  07/04/1997  . Colon resection  2004  . L4 discectomy    . Elbow surgery Left 2011    INFECTED OLECRANON BURSITIS SURGERY  . Transurethral resection of prostate    . Retinal detachment surgery  1998  . Septic joint      Current Outpatient Prescriptions  Medication Sig Dispense Refill  . aspirin 81 MG tablet Take 81 mg by mouth daily.        Marland Kitchen CIALIS 5 MG tablet       . CRESTOR 10 MG tablet TAKE 1 TABLET BY MOUTH EVERY EVENING  30 tablet  6  . diltiazem (CARDIZEM CD) 180 MG 24 hr capsule TAKE ONE CAPSULE BY MOUTH EVERY DAY  30 capsule  3  . fish oil-omega-3 fatty acids 1000 MG capsule Take 1,200 mg by mouth daily.        Marland Kitchen l-methylfolate-B6-B12 (METANX) 3-35-2 MG TABS Take 1 tablet by mouth 2 (two) times daily. Vitamin B complex      . lisinopril-hydrochlorothiazide (PRINZIDE,ZESTORETIC) 20-12.5 MG per tablet Take 1 tablet by mouth daily.      Marland Kitchen LORazepam (ATIVAN) 0.5 MG tablet Take 0.5 mg by mouth daily as needed. When heart races       . metoprolol succinate (TOPROL-XL) 25 MG 24 hr tablet Take 25 mg by mouth daily. Take 1 in the morning and  and 1/2 tablets in the evening.      . Multiple Vitamin (MULTIVITAMIN) tablet Take 1 tablet by mouth daily.        . naproxen (NAPROSYN) 500 MG tablet As needed      . NITROSTAT 0.4 MG SL tablet  Place 0.4 mg under the tongue every 5 (five) minutes as needed.       Marland Kitchen  NON FORMULARY CPAP MACHINE      . zaleplon (SONATA) 10 MG capsule Take 10 mg by mouth at bedtime as needed. For sleep      . [DISCONTINUED] hyoscyamine (LEVSIN SL) 0.125 MG SL tablet        No current facility-administered medications for this visit.    Allergies as of 03/20/2014 - Review Complete 03/20/2014  Allergen Reaction Noted  . Other  03/29/2012    Vitals: BP 95/61  [name] 60  Ht 5\' 10"  (1.778 m)  Wt 172 lb (78.019 kg)  BMI 24.68 kg/m2 Last Weight:  Wt Readings from Last 1 Encounters:  03/20/14 172 lb (78.019 kg)   Last Height:   Ht Readings from Last 1 Encounters:  03/20/14 5\' 10"  (1.778 m)   Physical exam:  General: The patient is awake, alert and appears not in acute distress. The patient is well groomed. Head: Normocephalic, atraumatic. Neck is supple. Mallampati 2 , neck circumference:16. No retrognathia.  Cardiovascular:  Regular rate and rhythm , without  murmurs or carotid bruit, and without distended neck veins. Respiratory: Lungs are clear to auscultation. Skin:  Without evidence of edema, or rash Trunk: BMI is  normal posture.  Neurologic exam : The patient is awake and alert, oriented to place and time.   Memory subjective  described as intact. There is a normal attention span & concentration ability.  Speech is fluent without  dysarthria, dysphonia or aphasia. Mood and affect are appropriate.  Cranial nerves: Pupils are equal and briskly reactive to light. Funduscopic exam without evidence of pallor or edema. Extraocular movements  in vertical and horizontal planes intact and without nystagmus. Visual fields by finger perimetry are intact. Hearing to finger rub intact.  Facial sensation intact to fine touch. Facial motor strength is symmetric and tongue and uvula move midline.  Motor exam:   Normal tone and normal muscle bulk and symmetric normal strength in all  extremities.  Sensory:  Fine touch, pinprick and vibration were tested in all extremities. Proprioception is  normal.  Coordination: Rapid alternating movements in the fingers/hands is tested and normal.  Gait and station: Patient walks without assistive device and is able and assisted stool climb up to the exam table.  Deep tendon reflexes: in the  upper and lower extremities are symmetric and intact.   Assessment:  After physical and neurologic examination, review of laboratory studies, imaging, neurophysiology testing and pre-existing records, assessment :  OSA is best controlled, there is no further improvement likely. AHI 1.7 ,   He has had maximum benefit of apnea treatment on nocturia, insomnia .    Will follow yearly from now.         Plan:  Treatment plan and additional workup will be reviewed under Problem List.

## 2014-04-30 ENCOUNTER — Other Ambulatory Visit: Payer: Self-pay | Admitting: Neurology

## 2014-04-30 DIAGNOSIS — G4733 Obstructive sleep apnea (adult) (pediatric): Secondary | ICD-10-CM

## 2014-05-13 ENCOUNTER — Telehealth: Payer: Self-pay | Admitting: *Deleted

## 2014-05-13 MED ORDER — METOPROLOL SUCCINATE ER 25 MG PO TB24: ORAL_TABLET | ORAL | Status: DC

## 2014-05-13 NOTE — Telephone Encounter (Signed)
Spoke to patient he stated he is taking metoprolol 25 mg 1 1/2 tablets twice a day.Refill sent to pharmacy.

## 2014-05-13 NOTE — Telephone Encounter (Signed)
Can you please clarify what patients current metoprolol therapy should be? Thanks, MI

## 2014-05-20 ENCOUNTER — Other Ambulatory Visit: Payer: Self-pay | Admitting: Cardiology

## 2014-06-25 ENCOUNTER — Telehealth: Payer: Self-pay | Admitting: Cardiology

## 2014-06-25 MED ORDER — LISINOPRIL-HYDROCHLOROTHIAZIDE 20-12.5 MG PO TABS: ORAL_TABLET | ORAL | Status: DC

## 2014-06-25 NOTE — Telephone Encounter (Signed)
Returned call to patient he stated his B/P has been elevated ranging 135/80,138/85 pulse 50's.Stated he has been having bilateral knee pain.Stated he has had injections in both knees with little relief.Stated he will be having knee replacements.Stated he would like to increase lisinopril/hctz 20 /12.5 mg.Spoke to [place] he advised ok to take a extra 1/2 tablet in the afternoon.Advised to continue to monitor B/P and call back if continues to be elevated.

## 2014-06-25 NOTE — Telephone Encounter (Signed)
Pt called in stating that his BP has been elevated for the past couple of days. He said that when he took it today it was 135/80 and his pulse was 50. He is concerned and not sure what to do. Please call  Thanks

## 2014-07-04 ENCOUNTER — Telehealth: Payer: Self-pay | Admitting: Cardiology

## 2014-07-04 NOTE — Telephone Encounter (Signed)
Pt called in wanting to speak to [facility] about a recent medication change. Please call  Thanks

## 2014-07-05 NOTE — Telephone Encounter (Signed)
Message routed to [facility] 07/04/14.

## 2014-07-05 NOTE — Telephone Encounter (Signed)
Returned call to patient he stated he wanted Dr.Jordan to know Dr.Woodruff prescribed cialis for BPH.Stated he thinks it is causing his B/P to drop at times 109/60 pulse 88.Stated when his pulse is 88 or greater he gets a red face and feels bad.Stated cilias has been helping with frequent urination at night.Stated he takes a extra half lisinopril when B/P goes up.Stated he will continue as is and will monitor B/P.Stated he will keep appointment 07/24/14 with Dr.Jordan and will call sooner if needed.

## 2014-07-24 ENCOUNTER — Ambulatory Visit (INDEPENDENT_AMBULATORY_CARE_PROVIDER_SITE_OTHER): Payer: BC Managed Care – PPO | Admitting: Cardiology

## 2014-07-24 ENCOUNTER — Encounter: Payer: Self-pay | Admitting: Cardiology

## 2014-07-24 VITALS — BP 98/58 | HR 64 | Ht 69.0 in | Wt 164.0 lb

## 2014-07-24 DIAGNOSIS — E785 Hyperlipidemia, unspecified: Secondary | ICD-10-CM

## 2014-07-24 DIAGNOSIS — I2510 Atherosclerotic heart disease of native coronary artery without angina pectoris: Secondary | ICD-10-CM

## 2014-07-24 DIAGNOSIS — I10 Essential (primary) hypertension: Secondary | ICD-10-CM

## 2014-07-24 NOTE — Patient Instructions (Signed)
Continue your current therapy  I will see you in 6 months.   

## 2014-07-24 NOTE — Progress Notes (Signed)
Robert [name] Date of Birth: 1945-12-07   History of Present Illness: Robert [name] is seen today for followup. He is overall doing well. As usual he brings copious records of his heart rate and BP. Most of his reading show excellent BP control but he considers a reading of more than 130 high. He also complains that his heart is racing when it goes up to 90 bpm. He adjusts his Toprol and lisinopril regularly. He was started on Cialis for treatment of BPH and has noticed a marked reduction in nocturia as well as improvement in libido. Since this change his BP was running much lower. One week ago his Cialis dose was reduced to 2.5 mg and since then his BP and HR have been excellent.  Current Outpatient Prescriptions on File Prior to Visit  Medication Sig Dispense Refill  . aspirin 81 MG tablet Take 81 mg by mouth daily.        Marland Kitchen CIALIS 5 MG tablet Take 2.5 mg by mouth daily.       . CRESTOR 10 MG tablet TAKE 1 TABLET BY MOUTH EVERY EVENING  30 tablet  6  . diltiazem (CARDIZEM CD) 180 MG 24 hr capsule TAKE ONE CAPSULE BY MOUTH EVERY DAY  30 capsule  1  . fish oil-omega-3 fatty acids 1000 MG capsule Take 1,200 mg by mouth daily.        Marland Kitchen l-methylfolate-B6-B12 (METANX) 3-35-2 MG TABS Take 1 tablet by mouth 2 (two) times daily. Vitamin B complex      . lisinopril-hydrochlorothiazide (PRINZIDE) 20-12.5 MG per tablet Take 1 tablet in am and take 1/2 tablet in pm  45 tablet  6  . LORazepam (ATIVAN) 0.5 MG tablet Take 0.5 mg by mouth daily as needed. When heart races       . Multiple Vitamin (MULTIVITAMIN) tablet Take 1 tablet by mouth daily.        . naproxen (NAPROSYN) 500 MG tablet As needed      . NITROSTAT 0.4 MG SL tablet Place 0.4 mg under the tongue every 5 (five) minutes as needed.       . NON FORMULARY CPAP MACHINE      . zaleplon (SONATA) 10 MG capsule Take 10 mg by mouth at bedtime as needed. For sleep      . [DISCONTINUED] hyoscyamine (LEVSIN SL) 0.125 MG SL tablet        No current  facility-administered medications on file prior to visit.    Allergies  Allergen Reactions  . Other     Allergic to dye used when you give blood. Says its an orange dye    Past Medical History  Diagnosis Date  . Hypertension   . Hyperlipidemia   . Colon cancer     a. s/p colon resection in 2004  . S/P cardiac catheterization 1995 and 1998    a. following abnormal stress tests. Both reportedly normal;  b. 02/2013 nl myoview.  . Anxiety   . Neuropathy     SECONDARY TO CHEMOTHERAPY  . Sleep apnea with use of continuous positive airway pressure (CPAP)     08-16-12  AHi 18.5  78% nadir, titratet to 6 cm with AHi of 4 .   Marland Kitchen Nocturia associated with benign prostatic hypertrophy 03/21/2013  . Palpitations     Past Surgical History  Procedure Laterality Date  . Cardiac catheterization  07/04/1997  . Colon resection  2004  . L4 discectomy    . Elbow surgery Left 2011  INFECTED OLECRANON BURSITIS SURGERY  . Transurethral resection of prostate    . Retinal detachment surgery  1998  . Septic joint      History  Smoking status  . Never Smoker   Smokeless tobacco  . Never Used    History  Alcohol Use  . 1.1 oz/week  . 1 Glasses of wine, 1 Drinks containing 0.5 oz of alcohol per week    Comment: each night    Family History  Problem Relation Age of Onset  . Emphysema Mother   . Heart attack Father   . Hypertension Father   . Hypertension Other   . 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 He has received some injections with Dr. Maureen Ralphs and may eventually need partial knee replacement. All other systems are reviewed and are negative.  Physical Exam: BP 98/58  Pulse 64  Ht 5\' 9"  (1.753 m)  Wt 164 lb (74.39 kg)  BMI 24.21 kg/m2 He is a pleasant white male in no acute distress. His HEENT exam is unremarkable. He has no JVD or bruits. Lungs are clear. Cardiac exam reveals a regular rate and rhythm without gallop,  murmur, or click. Abdomen is soft and nontender. He has no edema. Pedal pulses are good.  LABORATORY DATA: Labs reviewed from 06/10/14 and scanned into chart. Normal CMET and CBC. Cholesterol 170, trig-84, HDL 50, LDL 103.  Assessment / Plan: 1. Hypertension. Blood pressure is under excellent control currently.   2. Hyperlipidemia, controlled on Crestor and fish oil.  3. Coronary calcification noted incidentally on a CT scan. This was in the mid RCA distribution. Stress Myoview study was negative for ischemia. Ejection fraction was normal.  4. Obstructive sleep apnea now on CPAP.  5. BPH improved on Cialis

## 2014-07-25 ENCOUNTER — Emergency Department (HOSPITAL_COMMUNITY): Payer: BC Managed Care – PPO

## 2014-07-25 ENCOUNTER — Encounter (HOSPITAL_COMMUNITY): Payer: Self-pay | Admitting: Emergency Medicine

## 2014-07-25 ENCOUNTER — Emergency Department (HOSPITAL_COMMUNITY)
Admission: EM | Admit: 2014-07-25 | Discharge: 2014-07-25 | Disposition: A | Discharge status: Home / Self Care | Payer: BC Managed Care – PPO | Attending: Emergency Medicine | Admitting: Emergency Medicine

## 2014-07-25 DIAGNOSIS — Z791 Long term (current) use of non-steroidal anti-inflammatories (NSAID): Secondary | ICD-10-CM | POA: Insufficient documentation

## 2014-07-25 DIAGNOSIS — Z7982 Long term (current) use of aspirin: Secondary | ICD-10-CM | POA: Insufficient documentation

## 2014-07-25 DIAGNOSIS — G4733 Obstructive sleep apnea (adult) (pediatric): Secondary | ICD-10-CM | POA: Diagnosis not present

## 2014-07-25 DIAGNOSIS — Z85038 Personal history of other malignant neoplasm of large intestine: Secondary | ICD-10-CM | POA: Insufficient documentation

## 2014-07-25 DIAGNOSIS — R531 Weakness: Secondary | ICD-10-CM | POA: Diagnosis not present

## 2014-07-25 DIAGNOSIS — Z79899 Other long term (current) drug therapy: Secondary | ICD-10-CM | POA: Diagnosis not present

## 2014-07-25 DIAGNOSIS — Z87438 Personal history of other diseases of male genital organs: Secondary | ICD-10-CM | POA: Diagnosis not present

## 2014-07-25 DIAGNOSIS — Z9889 Other specified postprocedural states: Secondary | ICD-10-CM | POA: Diagnosis not present

## 2014-07-25 DIAGNOSIS — R42 Dizziness and giddiness: Secondary | ICD-10-CM | POA: Diagnosis present

## 2014-07-25 DIAGNOSIS — E785 Hyperlipidemia, unspecified: Secondary | ICD-10-CM | POA: Diagnosis not present

## 2014-07-25 DIAGNOSIS — I10 Essential (primary) hypertension: Secondary | ICD-10-CM | POA: Diagnosis not present

## 2014-07-25 LAB — PROTIME-INR
INR: 0.95 (ref 0.00–1.49)
Prothrombin Time: 12.8 seconds (ref 11.6–15.2)

## 2014-07-25 LAB — CBG MONITORING, ED: Glucose-Capillary: 96 mg/dL (ref 70–99)

## 2014-07-25 LAB — CBC
HCT: 45.3 % (ref 39.0–52.0)
HEMOGLOBIN: 15.7 g/dL (ref 13.0–17.0)
MCH: 30.3 pg (ref 26.0–34.0)
MCHC: 34.7 g/dL (ref 30.0–36.0)
MCV: 87.3 fL (ref 78.0–100.0)
Platelets: 177 10*3/uL (ref 150–400)
RBC: 5.19 MIL/uL (ref 4.22–5.81)
RDW: 12.7 % (ref 11.5–15.5)
WBC: 7.9 10*3/uL (ref 4.0–10.5)

## 2014-07-25 LAB — COMPREHENSIVE METABOLIC PANEL
ALBUMIN: 4.5 g/dL (ref 3.5–5.2)
ALT: 33 U/L (ref 0–53)
AST: 23 U/L (ref 0–37)
Alkaline Phosphatase: 89 U/L (ref 39–117)
Anion gap: 14 (ref 5–15)
BUN: 23 mg/dL (ref 6–23)
CALCIUM: 10.8 mg/dL — AB (ref 8.4–10.5)
CO2: 27 mEq/L (ref 19–32)
CREATININE: 1.12 mg/dL (ref 0.50–1.35)
Chloride: 97 mEq/L (ref 96–112)
GFR calc non Af Amer: 66 mL/min — ABNORMAL LOW (ref >90)
GFR, EST AFRICAN AMERICAN: 77 mL/min — AB (ref >90)
GLUCOSE: 112 mg/dL — AB (ref 70–99)
Potassium: 4.1 mEq/L (ref 3.7–5.3)
Sodium: 138 mEq/L (ref 137–147)
Total Bilirubin: 0.3 mg/dL (ref 0.3–1.2)
Total Protein: 7.4 g/dL (ref 6.0–8.3)

## 2014-07-25 LAB — DIFFERENTIAL
BASOS PCT: 0 % (ref 0–1)
Basophils Absolute: 0 10*3/uL (ref 0.0–0.1)
EOS ABS: 0.1 10*3/uL (ref 0.0–0.7)
EOS PCT: 1 % (ref 0–5)
Lymphocytes Relative: 15 % (ref 12–46)
Lymphs Abs: 1.2 10*3/uL (ref 0.7–4.0)
Monocytes Absolute: 0.4 10*3/uL (ref 0.1–1.0)
Monocytes Relative: 5 % (ref 3–12)
Neutro Abs: 6.2 10*3/uL (ref 1.7–7.7)
Neutrophils Relative %: 79 % — ABNORMAL HIGH (ref 43–77)

## 2014-07-25 LAB — I-STAT TROPONIN, ED: TROPONIN I, POC: 0.01 ng/mL (ref 0.00–0.08)

## 2014-07-25 LAB — APTT: aPTT: 33 seconds (ref 24–37)

## 2014-07-25 NOTE — ED Notes (Signed)
Pt c/o feeling tired x 3 weeks; pt sts dizziness and "pressure in ears", with some trouble concentrating that is resolving; pt denies weakness or other neuro deficits; EDP to eval; per family pt trip and fall today but did not hit his head

## 2014-07-25 NOTE — ED Notes (Signed)
To mri 

## 2014-07-25 NOTE — Discharge Instructions (Signed)
Return for any new or worse symptoms. We discussed with her primary care doctor as well as her cardiologist. Some your current medications could be playing a role. Also referral provided to neurology if needed. MRI here today without any significant N O'Mal[place]'s. Labs without any significant abnormalities.

## 2014-07-25 NOTE — ED Provider Notes (Signed)
CSN: [id number]     Arrival date & time 07/25/14  1401 History   First MD Initiated Contact with Patient 07/25/14 1611     Chief Complaint  Patient presents with  . Dizziness     (Consider location/radiation/quality/duration/timing/severity/associated sxs/prior Treatment) Patient is a 68 y.o. male presenting with dizziness. The history is provided by the patient.  Dizziness Associated symptoms: no chest pain, no headaches and no shortness of breath    patient's been feeling tired for about 3 weeks. Today at work had an episode of lightheadedness no true dizziness no true vertigo. Patient also felt foggy in the brain and seemed as if he was a little mildly confused. No speech problems no focal weakness or numbness. Patient had generalized feeling of weakness. No syncopal episode. During these episodes patient noted that his heart rate was in the as low as 42. Patient is on a beta blocker and calcium channel blocker. And his blood pressure is very tightly controlled. Heart rate is normally in the upper 50s or low 60s. [facility] here for further evaluation. Onset of symptoms were 11:00 this morning. EMS was called to the scene patient started to feel better opted not to be brought in. Had his wife take him to the urgent care and then was referred here.  Past Medical History  Diagnosis Date  . Hypertension   . Hyperlipidemia   . Colon cancer     a. s/p colon resection in 2004  . S/P cardiac catheterization 1995 and 1998    a. following abnormal stress tests. Both reportedly normal;  b. 02/2013 nl myoview.  . Anxiety   . Neuropathy     SECONDARY TO CHEMOTHERAPY  . Sleep apnea with use of continuous positive airway pressure (CPAP)     08-16-12  AHi 18.5  78% nadir, titratet to 6 cm with AHi of 4 .   Marland Kitchen Nocturia associated with benign prostatic hypertrophy 03/21/2013  . Palpitations    Past Surgical History  Procedure Laterality Date  . Cardiac  catheterization  07/04/1997  . Colon resection  2004  . L4 discectomy    . Elbow surgery Left 2011    INFECTED OLECRANON BURSITIS SURGERY  . Transurethral resection of prostate    . Retinal detachment surgery  1998  . Septic joint     Family History  Problem Relation Age of Onset  . Emphysema Mother   . Heart attack Father   . Hypertension Father   . Hypertension Other   . High Cholesterol Other    History  Substance Use Topics  . Smoking status: Never Smoker   . Smokeless tobacco: Never Used  . Alcohol Use: 1.1 oz/week    1 Glasses of wine, 1 Drinks containing 0.5 oz of alcohol per week     Comment: each night    Review of Systems  Constitutional: Positive for fatigue.  HENT: Negative for congestion.   Eyes: Negative for visual disturbance.  Respiratory: Negative for shortness of breath.   Cardiovascular: Negative for chest pain.  Gastrointestinal: Negative for abdominal pain.  Genitourinary: Negative for dysuria and hematuria.  Musculoskeletal: Negative for back pain, myalgias and neck pain.  Skin: Negative for rash.  Neurological: Positive for weakness and light-headedness. Negative for dizziness, speech difficulty, numbness and headaches.  Hematological: Does not bruise/bleed easily.  Psychiatric/Behavioral: Positive for confusion.      Allergies  Other  Home Medications   Prior to Admission medications  Medication Sig Start Date End Date Taking? Authorizing Provider  aspirin 81 MG tablet Take 81 mg by mouth daily.     Yes Historical Provider, MD  CIALIS 5 MG tablet Take 2.5 mg by mouth daily.  03/14/14  Yes Historical Provider, MD  diltiazem (CARDIZEM CD) 180 MG 24 hr capsule Take 180 mg by mouth daily.   Yes Historical Provider, MD  diltiazem (CARDIZEM CD) 180 MG 24 hr capsule TAKE ONE CAPSULE BY MOUTH EVERY DAY 05/21/14 07/25/14 Yes Peter M Martinique, MD  fish oil-omega-3 fatty acids 1000 MG capsule Take 1,200 mg by mouth daily.     Yes Historical Provider, MD   l-methylfolate-B6-B12 (METANX) 3-35-2 MG TABS Take 1 tablet by mouth 2 (two) times daily. Vitamin B complex 02/22/13  Yes Historical Provider, MD  lisinopril-hydrochlorothiazide (PRINZIDE,ZESTORETIC) 20-12.5 MG per tablet Take 0.5-1 tablets by mouth See admin instructions. Take 1 tablet in am and take 1/2 tablet in pm 06/25/14  Yes Peter M Martinique, MD  LORazepam (ATIVAN) 0.5 MG tablet Take 0.5 mg by mouth daily as needed. When heart races   Yes Historical Provider, MD  metoprolol succinate (TOPROL-XL) 25 MG 24 hr tablet Take 25 mg by mouth See admin instructions. 37.5 mg (1.5 tablets) QAM 25mg  (1 tablet) QPM 05/13/14  Yes Peter M Martinique, MD  Multiple Vitamin (MULTIVITAMIN) tablet Take 1 tablet by mouth daily.     Yes Historical Provider, MD  naproxen (NAPROSYN) 500 MG tablet Take 500 mg by mouth daily as needed for mild pain.  01/28/14  Yes Historical Provider, MD  NITROSTAT 0.4 MG SL tablet Place 0.4 mg under the tongue every 5 (five) minutes as needed.  03/31/12  Yes Historical Provider, MD  rosuvastatin (CRESTOR) 10 MG tablet Take 10 mg by mouth every evening.   Yes Historical Provider, MD  zaleplon (SONATA) 10 MG capsule Take 10 mg by mouth at bedtime as needed for sleep.   Yes Historical Provider, MD  NON FORMULARY CPAP MACHINE    Historical Provider, MD   BP 112/61  Pulse 73  Temp(Src) 97.7 F (36.5 C) (Oral)  Resp 16  SpO2 98% Physical Exam  Nursing note and vitals reviewed. Constitutional: He is oriented to person, place, and time. He appears well-developed and well-nourished. No distress.  HENT:  Head: Normocephalic and atraumatic.  Mouth/Throat: Oropharynx is clear and moist.  Eyes: Conjunctivae and EOM are normal. Pupils are equal, round, and reactive to light.  Neck: Normal range of motion. Neck supple.  Cardiovascular: Normal rate, regular rhythm and intact distal pulses.   Pulmonary/Chest: Effort normal and breath sounds normal. No respiratory distress.  Abdominal: Soft. Bowel  sounds are normal. There is no tenderness.  Musculoskeletal: Normal range of motion.  Neurological: He is alert and oriented to person, place, and time. No cranial nerve deficit. He exhibits normal muscle tone. Coordination normal.  Skin: Skin is warm. No rash noted.    ED Course  Procedures (including critical care time) Labs Review Labs Reviewed  DIFFERENTIAL - Abnormal; Notable for the following:    Neutrophils Relative % 79 (*)    All other components within normal limits  COMPREHENSIVE METABOLIC PANEL - Abnormal; Notable for the following:    Glucose, Bld 112 (*)    Calcium 10.8 (*)    GFR calc non Af Amer 66 (*)    GFR calc Af Amer 77 (*)    All other components within normal limits  PROTIME-INR  APTT  CBC  CBG MONITORING, ED  Randolm Idol, ED   Results for orders placed during the hospital encounter of 07/25/14  PROTIME-INR      Result Value Ref Range   Prothrombin Time 12.8  11.6 - 15.2 seconds   INR 0.95  0.00 - 1.49  APTT      Result Value Ref Range   aPTT 33  24 - 37 seconds  CBC      Result Value Ref Range   WBC 7.9  4.0 - 10.5 K/uL   RBC 5.19  4.22 - 5.81 MIL/uL   Hemoglobin 15.7  13.0 - 17.0 g/dL   HCT 45.3  39.0 - 52.0 %   MCV 87.3  78.0 - 100.0 fL   MCH 30.3  26.0 - 34.0 pg   MCHC 34.7  30.0 - 36.0 g/dL   RDW 12.7  11.5 - 15.5 %   Platelets 177  150 - 400 K/uL  DIFFERENTIAL      Result Value Ref Range   Neutrophils Relative % 79 (*) 43 - 77 %   Neutro Abs 6.2  1.7 - 7.7 K/uL   Lymphocytes Relative 15  12 - 46 %   Lymphs Abs 1.2  0.7 - 4.0 K/uL   Monocytes Relative 5  3 - 12 %   Monocytes Absolute 0.4  0.1 - 1.0 K/uL   Eosinophils Relative 1  0 - 5 %   Eosinophils Absolute 0.1  0.0 - 0.7 K/uL   Basophils Relative 0  0 - 1 %   Basophils Absolute 0.0  0.0 - 0.1 K/uL  COMPREHENSIVE METABOLIC PANEL      Result Value Ref Range   Sodium 138  137 - 147 mEq/L   Potassium 4.1  3.7 - 5.3 mEq/L   Chloride 97  96 - 112 mEq/L   CO2 27  19 - 32 mEq/L    Glucose, Bld 112 (*) 70 - 99 mg/dL   BUN 23  6 - 23 mg/dL   Creatinine, Ser 1.12  0.50 - 1.35 mg/dL   Calcium 10.8 (*) 8.4 - 10.5 mg/dL   Total Protein 7.4  6.0 - 8.3 g/dL   Albumin 4.5  3.5 - 5.2 g/dL   AST 23  0 - 37 U/L   ALT 33  0 - 53 U/L   Alkaline Phosphatase 89  39 - 117 U/L   Total Bilirubin 0.3  0.3 - 1.2 mg/dL   GFR calc non Af Amer 66 (*) >90 mL/min   GFR calc Af Amer 77 (*) >90 mL/min   Anion gap 14  5 - 15  CBG MONITORING, ED      Result Value Ref Range   Glucose-Capillary 96  70 - 99 mg/dL  I-STAT TROPOININ, ED      Result Value Ref Range   Troponin i, poc 0.01  0.00 - 0.08 ng/mL   Comment 3              Imaging Review Ct Head (brain) Wo Contrast  07/25/2014   ADDENDUM REPORT: 07/25/2014 20:18  ADDENDUM: Correction in the Clinical Data, Sentence should read: "[place] today without hitting head."   Electronically Signed   By: Rolla Flatten M.D.   On: 07/25/2014 20:18   07/25/2014   CLINICAL DATA:  Patient complains of feeling tired for 3 weeks. Dizziness. Fall D headed healing. Bradycardia. Nonfocal symptoms which are improving. Remote history of colon cancer.  EXAM: CT HEAD WITHOUT CONTRAST  TECHNIQUE: Contiguous axial images were obtained from the base  of the skull through the vertex without contrast.  COMPARISON:  None  FINDINGS: Normal appearance of the intracranial structures. No evidence for acute hemorrhage, mass lesion, midline shift, hydrocephalus or large infarct. No acute bony abnormality. The visualized sinuses are clear.  IMPRESSION: No acute intracranial abnormality.  Electronically Signed: By: Rolla Flatten M.D. On: 07/25/2014 15:48   Mr Brain Wo Contrast  07/25/2014   CLINICAL DATA:  Tired for 3 weeks. Ear pressure. Dizziness. Initial encounter. [place] today without hitting head.  EXAM: MRI HEAD WITHOUT CONTRAST  TECHNIQUE: Multiplanar, multiecho pulse sequences of the brain and surrounding structures were obtained without intravenous contrast.  COMPARISON:   CT head earlier today.  FINDINGS: No evidence for acute infarction, hemorrhage, mass lesion, hydrocephalus, or extra-axial fluid. Normal for age cerebral volume. Minor foci of subcortical white matter signal abnormality, nonspecific. Flow voids are maintained throughout the carotid, basilar, and LEFT vertebral arteries. RIGHT vertebral diminutive, likely supplying only PICA. There are no areas of chronic hemorrhage. Pituitary, pineal, and cerebellar tonsils unremarkable. No upper cervical lesions. Visualized calvarium, skull base, and upper cervical osseous structures unremarkable. Scalp and extracranial soft tissues, orbits, sinuses, and mastoids show no acute process.  IMPRESSION: Minor age-related changes as described. No acute intracranial findings.   Electronically Signed   By: Rolla Flatten M.D.   On: 07/25/2014 20:16     EKG Interpretation   Date/Time:  Thursday July 25 2014 14:34:06 EDT Ventricular Rate:  61 PR Interval:  164 QRS Duration: 82 QT Interval:  420 QTC Calculation: 422 R Axis:   57 Text Interpretation:  Normal sinus rhythm with sinus arrhythmia Low  voltage QRS Septal infarct , age undetermined Abnormal ECG Since last  tracing rate slower Confirmed by MILLER  MD, [place] ([id number]) on 07/25/2014  3:34:01 PM Also confirmed by Rogene Houston  MD, [place] [phone])  on 07/25/2014  4:40:37 PM      MDM   Final diagnoses:  Weakness  Lightheadedness    The patient followed by cardiology. Also followed by Dr. Blenda Bridegroom. Patient with history of hypertension and hyperlipidemia. Patient at work today had an episode of fogginess feeling lightheaded feeling he wasn't thinking clearly no distinct focal neuro deficit. chest pain no shortness of breath no fever no headache. No true dizziness no vertigo. [facility] and then was referred here. Patient is on a beta blocker as well as calcium channel blocker. Raquel Sarna was feeling poorly at work today heart rate had  gotten as low as 42. The patient states his heart rate is frequently in the upper 50s. Not sure if that contributed.  Complete workup here including EKG without any acute changes. Troponin negative for any acute cardiac event. Head CT and MRI all negative. Chest x-ray was not done. Electrolytes without significant abnormalities no evidence of any renal insufficiency or low potassium. No evidence of anemia or significant leukocytosis. Patient is now completely asymptomatic. Will be discharged home referral to neurology provided as needed but can follow-up initially with his regular doctor. And his cardiologist. Patient return for any new or worse symptoms.  Based on the MRI no evidence of any CVA.  Fredia Sorrow, MD 07/25/14 2044

## 2014-07-25 NOTE — ED Notes (Signed)
Pt to ct 

## 2014-07-25 NOTE — ED Notes (Signed)
Returned from mri 

## 2014-07-25 NOTE — ED Provider Notes (Signed)
MSE was initiated and I personally evaluated the patient and placed orders (if any) at  2:48 PM on October 61, 6856.  68 year old male presents with "foggy headed" over the past 3-1/2 hours. Patient states there is no specific room spinning sensation is not going to pass out. Is not unsteady on his feet. This started acutely but denies any headache. He feels pressure in both ears. Quick neurologic exam shows no cerebellar ataxia or weakness in his extremities. No facial droop. He has had issues with low heart rate before his work with his cardiologist on this. His heart rate now is 60 which is normal for him. At this point will do screening labs and a CT scan. He is not a candidate for a code stroke as his symptoms are nonfocal his symptoms seem to be improving at this time.  The patient appears stable so that the remainder of the MSE may be completed by another provider.  Ephraim Hamburger, MD 07/25/14 [phone]

## 2014-07-27 ENCOUNTER — Other Ambulatory Visit: Payer: Self-pay

## 2014-07-27 MED ORDER — DILTIAZEM HCL ER COATED BEADS 180 MG PO CP24: 180.0000 mg | ORAL_CAPSULE | Freq: Every day | ORAL | Status: DC

## 2014-08-07 ENCOUNTER — Telehealth: Payer: Self-pay | Admitting: Cardiology

## 2014-08-07 ENCOUNTER — Other Ambulatory Visit: Payer: Self-pay

## 2014-08-07 MED ORDER — ROSUVASTATIN CALCIUM 10 MG PO TABS: 10.0000 mg | ORAL_TABLET | Freq: Every evening | ORAL | Status: DC

## 2014-08-07 NOTE — Telephone Encounter (Signed)
Pt called in wanting to inform Malachy Mood on what he is currently taking for his meds.Marland KitchenMarland KitchenMarland KitchenMetoprolol 25mg  bid, Lisinopril HCTZ 20mg  , and Diltazem 180. Please call if you need to  Thanks

## 2014-08-09 ENCOUNTER — Encounter: Payer: Self-pay | Admitting: Neurology

## 2014-08-14 NOTE — Telephone Encounter (Signed)
Returned call to patient he stated he wanted to let Dr.Jordan know he is taking Metoprolol 25 mg twice a day,Lisinopril/HCTZ 20/12.5 mg daily,Ditiazem 180 mg daily,Cialis 5 mg daily.Stated his B/P has been alittle higher in mornings ranging 137/85 and in afternoons ranging 110/60.Stated he is just not worrying about the morning B/P because it is lower in the afternoons.Advised to continue monitor and call back if needed.

## 2014-08-29 ENCOUNTER — Telehealth: Payer: Self-pay

## 2014-08-29 NOTE — Telephone Encounter (Signed)
Received surgical clearance from [facility].Dr.Jordan cleared patient for upcoming surgery.Form axed to fax # [phone].

## 2014-09-16 ENCOUNTER — Telehealth: Payer: Self-pay | Admitting: Cardiology

## 2014-09-16 NOTE — Telephone Encounter (Signed)
Robert [name] is calling to review his bp results with you Malachy Mood. Please call .  Thanks

## 2014-09-16 NOTE — Telephone Encounter (Signed)
Left message for pt to call.

## 2014-09-17 NOTE — Telephone Encounter (Signed)
Spoke with pt, about one month ago his PCP had him cut his lisinopril/hctz in 1/2. His bp has been running high in the evenings and he has just not felt right. He is going to go back to the 20/12.5 mg of the lisinopril/hctz and track his bp. He is going to track his bp and then fax [place] the results and then with dr jordan's help decide about the bp medicine.

## 2014-10-21 ENCOUNTER — Other Ambulatory Visit: Payer: Self-pay | Admitting: *Deleted

## 2014-12-17 ENCOUNTER — Ambulatory Visit: Payer: Self-pay | Admitting: Orthopedic Surgery

## 2014-12-17 NOTE — Progress Notes (Signed)
Preoperative surgical orders have been place into the Epic hospital system for Robert [name] on 12/17/2014, 12:02 PM  by Mickel Crow for surgery on 01-06-2015.  Preop Total Knee orders including Experal, IV Tylenol, and IV Decadron as long as there are no contraindications to the above medications. Arlee Muslim, PA-C

## 2014-12-27 ENCOUNTER — Encounter (HOSPITAL_COMMUNITY): Payer: Self-pay

## 2014-12-27 NOTE — Patient Instructions (Addendum)
Robert [name]  12/27/2014   Your procedure is scheduled on:  01/06/2015    Report to [facility] Main  Entrance and follow signs to               [place] at     [place] AM.  Call this number if you have problems the morning of surgery [phone]   Remember: Bring ear plugs and CPAP mask and tubing.    Do not eat food or drink liquids :After Midnight.     Take these medicines the morning of surgery with A SIP OF WATER:  Ativan if needed, Toprol ( Metoprolol)                                You may not have any metal on your body including hair pins and              piercings  Do not wear jewelry, , lotions, powders or perfumes., deodorant.                            Men may shave face and neck.   Do not bring valuables to the hospital. [place].  Contacts, dentures or bridgework may not be worn into surgery.  Leave suitcase in the car. After surgery it may be brought to your room.         Special Instructions: coughing and deep breathing exercises, leg exercises               Please read over the following fact sheets you were given: _____________________________________________________________________             [facility] - Preparing for Surgery Before surgery, you can play an important role.  Because skin is not sterile, your skin needs to be as free of germs as possible.  You can reduce the number of germs on your skin by washing with CHG (chlorahexidine gluconate) soap before surgery.  CHG is an antiseptic cleaner which kills germs and bonds with the skin to continue killing germs even after washing. Please DO NOT use if you have an allergy to CHG or antibacterial soaps.  If your skin becomes reddened/irritated stop using the CHG and inform your nurse when you arrive at Short Stay. Do not shave (including legs and underarms) for at least 48 hours prior to the first CHG shower.  You may shave  your face/neck. Please follow these instructions carefully:  1.  Shower with CHG Soap the night before surgery and the  morning of Surgery.  2.  If you choose to wash your hair, wash your hair first as usual with your  normal  shampoo.  3.  After you shampoo, rinse your hair and body thoroughly to remove the  shampoo.                           4.  Use CHG as you would any other liquid soap.  You can apply chg directly  to the skin and wash                       Gently  with a scrungie or clean washcloth.  5.  Apply the CHG Soap to your body ONLY FROM THE NECK DOWN.   Do not use on face/ open                           Wound or open sores. Avoid contact with eyes, ears mouth and genitals (private parts).                       Wash face,  Genitals (private parts) with your normal soap.             6.  Wash thoroughly, paying special attention to the area where your surgery  will be performed.  7.  Thoroughly rinse your body with warm water from the neck down.  8.  DO NOT shower/wash with your normal soap after using and rinsing off  the CHG Soap.                9.  Pat yourself dry with a clean towel.            10.  Wear clean pajamas.            11.  Place clean sheets on your bed the night of your first shower and do not  sleep with pets. Day of Surgery : Do not apply any lotions/deodorants the morning of surgery.  Please wear clean clothes to the hospital/surgery center.  FAILURE TO FOLLOW THESE INSTRUCTIONS MAY RESULT IN THE CANCELLATION OF YOUR SURGERY PATIENT SIGNATURE_________________________________  NURSE SIGNATURE__________________________________  ________________________________________________________________________  WHAT IS A BLOOD TRANSFUSION? Blood Transfusion Information  A transfusion is the replacement of blood or some of its parts. Blood is made up of multiple cells which provide different functions.  Red blood cells carry oxygen and are used for blood loss  replacement.  White blood cells fight against infection.  Platelets control bleeding.  Plasma helps clot blood.  Other blood products are available for specialized needs, such as hemophilia or other clotting disorders. BEFORE THE TRANSFUSION  Who gives blood for transfusions?   Healthy volunteers who are fully evaluated to make sure their blood is safe. This is blood bank blood. Transfusion therapy is the safest it has ever been in the practice of medicine. Before blood is taken from a donor, a complete history is taken to make sure that person has no history of diseases nor engages in risky social behavior (examples are intravenous drug use or sexual activity with multiple partners). The donor's travel history is screened to minimize risk of transmitting infections, such as malaria. The donated blood is tested for signs of infectious diseases, such as HIV and hepatitis. The blood is then tested to be sure it is compatible with you in order to minimize the chance of a transfusion reaction. If you or a relative donates blood, this is often done in anticipation of surgery and is not appropriate for emergency situations. It takes many days to process the donated blood. RISKS AND COMPLICATIONS Although transfusion therapy is very safe and saves many lives, the main dangers of transfusion include:  1. Getting an infectious disease. 2. Developing a transfusion reaction. This is an allergic reaction to something in the blood you were given. Every precaution is taken to prevent this. The decision to have a blood transfusion has been considered carefully by your caregiver before blood is given. Blood is not given unless the benefits outweigh the  risks. AFTER THE TRANSFUSION  Right after receiving a blood transfusion, you will usually feel much better and more energetic. This is especially true if your red blood cells have gotten low (anemic). The transfusion raises the level of the red blood cells which  carry oxygen, and this usually causes an energy increase.  The nurse administering the transfusion will monitor you carefully for complications. HOME CARE INSTRUCTIONS  No special instructions are needed after a transfusion. You may find your energy is better. Speak with your caregiver about any limitations on activity for underlying diseases you may have. SEEK MEDICAL CARE IF:   Your condition is not improving after your transfusion.  You develop redness or irritation at the intravenous (IV) site. SEEK IMMEDIATE MEDICAL CARE IF:  Any of the following symptoms occur over the next 12 hours:  Shaking chills.  You have a temperature by mouth above 102 F (38.9 C), not controlled by medicine.  Chest, back, or muscle pain.  People around you feel you are not acting correctly or are confused.  Shortness of breath or difficulty breathing.  Dizziness and fainting.  You get a rash or develop hives.  You have a decrease in urine output.  Your urine turns a dark color or changes to pink, red, or brown. Any of the following symptoms occur over the next 10 days:  You have a temperature by mouth above 102 F (38.9 C), not controlled by medicine.  Shortness of breath.  Weakness after normal activity.  The white part of the eye turns yellow (jaundice).  You have a decrease in the amount of urine or are urinating less often.  Your urine turns a dark color or changes to pink, red, or brown. Document Released: 09/10/2000 Document Revised: 12/06/2011 Document Reviewed: 04/29/2008 ExitCare Patient Information 2014 [place].  _______________________________________________________________________  Incentive Spirometer  An incentive spirometer is a tool that can help keep your lungs clear and active. This tool measures how well you are filling your lungs with each breath. Taking long deep breaths may help reverse or decrease the chance of developing breathing (pulmonary) problems  (especially infection) following:  A long period of time when you are unable to move or be active. BEFORE THE PROCEDURE   If the spirometer includes an indicator to show your best effort, your nurse or respiratory therapist will set it to a desired goal.  If possible, sit up straight or lean slightly forward. Try not to slouch.  Hold the incentive spirometer in an upright position. INSTRUCTIONS FOR USE  3. Sit on the edge of your bed if possible, or sit up as far as you can in bed or on a chair. 4. Hold the incentive spirometer in an upright position. 5. Breathe out normally. 6. Place the mouthpiece in your mouth and seal your lips tightly around it. 7. Breathe in slowly and as deeply as possible, raising the piston or the ball toward the top of the column. 8. Hold your breath for 3-5 seconds or for as long as possible. Allow the piston or ball to fall to the bottom of the column. 9. Remove the mouthpiece from your mouth and breathe out normally. 10. Rest for a few seconds and repeat Steps 1 through 7 at least 10 times every 1-2 hours when you are awake. Take your time and take a few normal breaths between deep breaths. 11. The spirometer may include an indicator to show your best effort. Use the indicator as a goal to work toward  during each repetition. 12. After each set of 10 deep breaths, practice coughing to be sure your lungs are clear. If you have an incision (the cut made at the time of surgery), support your incision when coughing by placing a pillow or rolled up towels firmly against it. Once you are able to get out of bed, walk around indoors and cough well. You may stop using the incentive spirometer when instructed by your caregiver.  RISKS AND COMPLICATIONS  Take your time so you do not get dizzy or light-headed.  If you are in pain, you may need to take or ask for pain medication before doing incentive spirometry. It is harder to take a deep breath if you are having  pain. AFTER USE  Rest and breathe slowly and easily.  It can be helpful to keep track of a log of your progress. Your caregiver can provide you with a simple table to help with this. If you are using the spirometer at home, follow these instructions: [place] IF:   You are having difficultly using the spirometer.  You have trouble using the spirometer as often as instructed.  Your pain medication is not giving enough relief while using the spirometer.  You develop fever of 100.5 F (38.1 C) or higher. SEEK IMMEDIATE MEDICAL CARE IF:   You cough up bloody sputum that had not been present before.  You develop fever of 102 F (38.9 C) or greater.  You develop worsening pain at or near the incision site. MAKE SURE YOU:   Understand these instructions.  Will watch your condition.  Will get help right away if you are not doing well or get worse. Document Released: 01/24/2007 Document Revised: 12/06/2011 Document Reviewed: 03/27/2007 [facility] Patient Information 2014 [place], [place].   ________________________________________________________________________

## 2014-12-31 ENCOUNTER — Encounter (HOSPITAL_COMMUNITY): Payer: Self-pay

## 2014-12-31 ENCOUNTER — Encounter (HOSPITAL_COMMUNITY)
Admission: RE | Admit: 2014-12-31 | Discharge: 2014-12-31 | Disposition: A | Discharge status: Home / Self Care | Payer: BLUE CROSS/BLUE SHIELD | Source: Ambulatory Visit | Attending: Orthopedic Surgery | Admitting: Orthopedic Surgery

## 2014-12-31 DIAGNOSIS — Z01812 Encounter for preprocedural laboratory examination: Secondary | ICD-10-CM | POA: Insufficient documentation

## 2014-12-31 HISTORY — DX: Unspecified osteoarthritis, unspecified site: M19.90

## 2014-12-31 HISTORY — DX: Other complications of anesthesia, initial encounter: T88.59XA

## 2014-12-31 HISTORY — DX: Localized edema: R60.0

## 2014-12-31 LAB — COMPREHENSIVE METABOLIC PANEL
ALT: 27 U/L (ref 0–53)
AST: 28 U/L (ref 0–37)
Albumin: 4.4 g/dL (ref 3.5–5.2)
Alkaline Phosphatase: 72 U/L (ref 39–117)
Anion gap: 8 (ref 5–15)
BILIRUBIN TOTAL: 0.6 mg/dL (ref 0.3–1.2)
BUN: 28 mg/dL — ABNORMAL HIGH (ref 6–23)
CO2: 26 mmol/L (ref 19–32)
CREATININE: 0.98 mg/dL (ref 0.50–1.35)
Calcium: 10.4 mg/dL (ref 8.4–10.5)
Chloride: 104 mmol/L (ref 96–112)
GFR calc Af Amer: >90 mL/min (ref >90)
GFR, EST NON AFRICAN AMERICAN: 83 mL/min — AB (ref >90)
GLUCOSE: 87 mg/dL (ref 70–99)
Potassium: 4.1 mmol/L (ref 3.5–5.1)
SODIUM: 138 mmol/L (ref 135–145)
Total Protein: 7 g/dL (ref 6.0–8.3)

## 2014-12-31 LAB — URINALYSIS, ROUTINE W REFLEX MICROSCOPIC
BILIRUBIN URINE: NEGATIVE
Glucose, UA: NEGATIVE mg/dL
Hgb urine dipstick: NEGATIVE
Ketones, ur: NEGATIVE mg/dL
Leukocytes, UA: NEGATIVE
Nitrite: NEGATIVE
Protein, ur: NEGATIVE mg/dL
Specific Gravity, Urine: 1.018 (ref 1.005–1.030)
UROBILINOGEN UA: 0.2 mg/dL (ref 0.0–1.0)
pH: 6.5 (ref 5.0–8.0)

## 2014-12-31 LAB — CBC
HEMATOCRIT: 45.5 % (ref 39.0–52.0)
Hemoglobin: 14.9 g/dL (ref 13.0–17.0)
MCH: 29.2 pg (ref 26.0–34.0)
MCHC: 32.7 g/dL (ref 30.0–36.0)
MCV: 89 fL (ref 78.0–100.0)
Platelets: 187 10*3/uL (ref 150–400)
RBC: 5.11 MIL/uL (ref 4.22–5.81)
RDW: 13.1 % (ref 11.5–15.5)
WBC: 6 10*3/uL (ref 4.0–10.5)

## 2014-12-31 LAB — SURGICAL PCR SCREEN
MRSA, PCR: NEGATIVE
Staphylococcus aureus: NEGATIVE

## 2014-12-31 LAB — APTT: APTT: 32 s (ref 24–37)

## 2014-12-31 LAB — PROTIME-INR
INR: 0.97 (ref 0.00–1.49)
Prothrombin Time: 13 seconds (ref 11.6–15.2)

## 2014-12-31 NOTE — Progress Notes (Signed)
CMP results done 12/31/2014 faxed via EPIC to Dr Wynelle Link.

## 2014-12-31 NOTE — Progress Notes (Addendum)
clerance on chart- Dr Peter Martinique  EKG- 07/26/14 EPIC  2014- Stress Test in [facility]  Sleep documents- 2013 EPIC  06/2014 LOV- Dr Peter Martinique - EPIC  LOV- 02/2014 with Neurology in [facility]

## 2015-01-04 ENCOUNTER — Ambulatory Visit: Payer: Self-pay | Admitting: Orthopedic Surgery

## 2015-01-04 NOTE — H&P (Signed)
Robert [name] DOB: 05-03-1946 Married / Language: English / Race: White Male Date of Admission:  01/06/2015 CC:  Left Knee Pain History of Present Illness The patient is a 69 year old male who comes in for a preoperative History and Physical. The patient is scheduled for a left total knee arthroplasty to be performed by Dr. [name]. Aluisio, MD at [facility] on 01-06-2015. The patient is a 69 year old male who presents for follow up of their knee. The patient is being followed for their bilateral knee pain and osteoarthritis. Symptoms reported today include: pain (lt knee) and stiffness (lt knee). The patient feels that they are doing poorly. Current treatment includes: NSAIDs (prn). The following medication has been used for pain control: antiinflammatory medication (Naproxen (prn)). The patient presented post Euflexxa series bilat knees. Doing better w/ right knee, but the left knee is still bothering him alot. He recently went to [facility] and had quite a bit of pain in the left knee. He feels that the right knee is going to continue to respond well to the Euflexxa. The left knee is already wearing off. He is having considerable problems with his left knee. The Euflexxa helped the right knee but not the left. He is scheduled for surgery for the left knee at this time. They have been treated conservatively in the past for the above stated problem and despite conservative measures, they continue to have progressive pain and severe functional limitations and dysfunction. They have failed non-operative management including home exercise, medications, and injections. It is felt that they would benefit from undergoing total joint replacement. Risks and benefits of the procedure have been discussed with the patient and they elect to proceed with surgery. There are no active contraindications to surgery such as ongoing infection or rapidly progressive neurological disease.  Problem List/Past  Medical Left knee pain (M25.562) Primary osteoarthritis of both knees (M17.0) Laceration of elbow, left (S51.012A) Knee osteoarthritis (EUFLEXXA) (M17.9) Osteoarthritis, Knee (715.96) Kidney Stone Hypercholesterolemia High blood pressure Peripheral Neuropathy Colon Cancer Stage III Sleep Apnea uses CPAP Enlarged prostate BPH Neuropathy secondary to chemotherapy Palpitations Anxiety Disorder  Allergies No Known Drug Allergies  Family History  Congestive Heart Failure mother Hypertension father father, sister and brother Heart Disease First Degree Relatives. mother and father Father Deceased, Myocardial infarction. age 12 Mother Deceased. age 30, multiple medical issues  Social History  Drug/Alcohol Rehab (Previously) no Current work status working full time Drug/Alcohol Rehab (Currently) no Tobacco use Never smoker. never smoker Children 5 or more Number of flights of stairs before winded greater than 5 Living situation live with spouse Illicit drug use no Tobacco / smoke exposure no Pain Contract no Exercise Exercises weekly; does running / walking and gym / weights Exercises daily; does running / walking and gym / weights Alcohol use current drinker; drinks beer, wine and hard liquor; 15 or more per week current drinker; drinks wine and hard liquor; 8-14 per week Marital status married Advance Directives Living Will, [place] with Wife  Medication History Metoprolol Succinate ER (25MG  Tablet ER 24HR, Oral) Active. Lisinopril-Hydrochlorothiazide (20-12.5MG  Tablet, Oral) Active. Fish Oil Active. Multivitamins (Oral) Active. Glucosamine Chondroitin Complx (Oral) Active. Crestor (10MG  Tablet, Oral) Active. Lisinopril (20MG  Tablet, Oral) Active. Aspirin EC (81MG  Tablet DR, Oral) Active. Colon Health Probiotic Active. B complex vitamin for neuropathy- unsure of the name Active. L-Methyl-MC  NAC (Oral) Specific dose unknown - Active. Cialis (5MG  Tablet, Oral) Active.  Past Surgical History Prostatectomy;  Transurethral Date: 1996. Colectomy Date: 2004. partial partial and complete Cataract Surgery bilateral Appendectomy Spinal Surgery Date: 1993. Vasectomy Colon Polyp Removal - Colonoscopy Repair of Dettached Retina Date: 1999. Cardiac Catherization Date: 1997. Negative Cath Left Elbow Infected Olecranon Bursitis Date: 2011.  Review of Systems General Not Present- Chills, Fatigue, Fever, Memory Loss, Night Sweats, Weight Gain and Weight Loss. Skin Not Present- Eczema, Hives, Itching, Lesions and Rash. HEENT Not Present- Dentures, Double Vision, Headache, Hearing Loss, Tinnitus and Visual Loss. Respiratory Not Present- Allergies, Chronic Cough, Coughing up blood, Shortness of breath at rest and Shortness of breath with exertion. Cardiovascular Not Present- Chest Pain, Difficulty Breathing Lying Down, Murmur, Palpitations, Racing/skipping heartbeats and Swelling. Gastrointestinal Not Present- Abdominal Pain, Bloody Stool, Constipation, Diarrhea, Difficulty Swallowing, Heartburn, Jaundice, Loss of appetitie, Nausea and Vomiting. Male Genitourinary Present- Urinating at Night. Not Present- Blood in Urine, Discharge, Flank Pain, Incontinence, Painful Urination, Urgency, Urinary frequency, Urinary Retention and Weak urinary stream. Musculoskeletal Present- Joint Pain. Not Present- Back Pain, Joint Swelling, Morning Stiffness, Muscle Pain, Muscle Weakness and Spasms. Neurological Not Present- Blackout spells, Difficulty with balance, Dizziness, Paralysis, Tremor and Weakness. Psychiatric Not Present- Insomnia.  Vitals Weight: 168 lb Height: 68.5in Body Surface Area: 1.91 m Body Mass Index: 25.17 kg/m   Physical Exam General Mental Status -Alert, cooperative and good historian. General Appearance-pleasant, Not in acute distress. Orientation-Oriented  X3. Build & Nutrition-Well nourished and Well developed.  Head and Neck Head-normocephalic, atraumatic . Neck Global Assessment - supple, no bruit auscultated on the right, no bruit auscultated on the left.  Eye Pupil - Bilateral-Regular and Round. Motion - Bilateral-EOMI.  Chest and Lung Exam Auscultation Breath sounds - clear at anterior chest wall and clear at posterior chest wall. Adventitious sounds - No Adventitious sounds.  Cardiovascular Auscultation Rhythm - Regular rate and rhythm. Heart Sounds - S1 WNL and S2 WNL. Murmurs & Other Heart Sounds - Auscultation of the heart reveals - No Murmurs.  Abdomen Palpation/Percussion Tenderness - Abdomen is non-tender to palpation. Rigidity (guarding) - Abdomen is soft. Auscultation Auscultation of the abdomen reveals - Bowel sounds normal.  Male Genitourinary Note: Not done, not pertinent to present illness  Musculoskeletal Note: On examination, he is alert and oriented in no apparent distress. His left knee shows no effusion. There is moderate crepitus on range of motion. Range is about 5 to 125. He has got slight varus deformity, tenderness medial greater than lateral with no instability. The right knee has no effusion and range is zero to 125. There is slight crepitus on range of motion and some tenderness medial greater than lateral with no instability noted.  Assessment & Plan  Primary osteoarthritis of both knees (M17.0) Note:Surgical Plans: Left Total Knee Repalcement  Disposition: Home  PCP: Dr. Lendon Ka. [name] - Patient has been seen preoperatively and felt to be stable for surgery. Cards: Dr. Peter Martinique - Patient has been seen preoperatively and felt to be stable for surgery.  Topical TXA  Signed electronically by Joelene Millin, III PA-C

## 2015-01-06 ENCOUNTER — Encounter (HOSPITAL_COMMUNITY)
Admission: RE | Disposition: A | Discharge status: Home / Self Care | Payer: Self-pay | Source: Ambulatory Visit | Attending: Orthopedic Surgery

## 2015-01-06 ENCOUNTER — Encounter (HOSPITAL_COMMUNITY): Payer: Self-pay | Admitting: *Deleted

## 2015-01-06 ENCOUNTER — Inpatient Hospital Stay (HOSPITAL_COMMUNITY): Payer: BLUE CROSS/BLUE SHIELD | Admitting: Certified Registered Nurse Anesthetist

## 2015-01-06 ENCOUNTER — Inpatient Hospital Stay (HOSPITAL_COMMUNITY)
Admission: RE | Admit: 2015-01-06 | Discharge: 2015-01-08 | DRG: 470 | Disposition: A | Discharge status: Home / Self Care | Payer: BLUE CROSS/BLUE SHIELD | Source: Ambulatory Visit | Attending: Orthopedic Surgery | Admitting: Orthopedic Surgery

## 2015-01-06 DIAGNOSIS — G4730 Sleep apnea, unspecified: Secondary | ICD-10-CM | POA: Diagnosis present

## 2015-01-06 DIAGNOSIS — I10 Essential (primary) hypertension: Secondary | ICD-10-CM | POA: Diagnosis present

## 2015-01-06 DIAGNOSIS — Z9079 Acquired absence of other genital organ(s): Secondary | ICD-10-CM | POA: Diagnosis present

## 2015-01-06 DIAGNOSIS — N401 Enlarged prostate with lower urinary tract symptoms: Secondary | ICD-10-CM | POA: Diagnosis present

## 2015-01-06 DIAGNOSIS — Z9049 Acquired absence of other specified parts of digestive tract: Secondary | ICD-10-CM | POA: Diagnosis present

## 2015-01-06 DIAGNOSIS — M25562 Pain in left knee: Secondary | ICD-10-CM | POA: Diagnosis present

## 2015-01-06 DIAGNOSIS — Z87442 Personal history of urinary calculi: Secondary | ICD-10-CM | POA: Diagnosis not present

## 2015-01-06 DIAGNOSIS — E785 Hyperlipidemia, unspecified: Secondary | ICD-10-CM | POA: Diagnosis present

## 2015-01-06 DIAGNOSIS — M1712 Unilateral primary osteoarthritis, left knee: Secondary | ICD-10-CM

## 2015-01-06 DIAGNOSIS — E780 Pure hypercholesterolemia: Secondary | ICD-10-CM | POA: Diagnosis present

## 2015-01-06 DIAGNOSIS — N200 Calculus of kidney: Secondary | ICD-10-CM | POA: Diagnosis present

## 2015-01-06 DIAGNOSIS — G620 Drug-induced polyneuropathy: Secondary | ICD-10-CM | POA: Diagnosis present

## 2015-01-06 DIAGNOSIS — Z85038 Personal history of other malignant neoplasm of large intestine: Secondary | ICD-10-CM | POA: Diagnosis not present

## 2015-01-06 DIAGNOSIS — M179 Osteoarthritis of knee, unspecified: Secondary | ICD-10-CM | POA: Diagnosis present

## 2015-01-06 DIAGNOSIS — M1710 Unilateral primary osteoarthritis, unspecified knee: Secondary | ICD-10-CM | POA: Diagnosis present

## 2015-01-06 DIAGNOSIS — Z7982 Long term (current) use of aspirin: Secondary | ICD-10-CM

## 2015-01-06 DIAGNOSIS — F419 Anxiety disorder, unspecified: Secondary | ICD-10-CM | POA: Diagnosis present

## 2015-01-06 DIAGNOSIS — Z86010 Personal history of colonic polyps: Secondary | ICD-10-CM | POA: Diagnosis not present

## 2015-01-06 HISTORY — PX: TOTAL KNEE ARTHROPLASTY: SHX125

## 2015-01-06 LAB — TYPE AND SCREEN
ABO/RH(D): O POS
ANTIBODY SCREEN: NEGATIVE

## 2015-01-06 SURGERY — ARTHROPLASTY, KNEE, TOTAL
Anesthesia: General | Laterality: Left

## 2015-01-06 MED ORDER — BUPIVACAINE LIPOSOME 1.3 % IJ SUSP
20.0000 mL | Freq: Once | INTRAMUSCULAR | Status: DC
  Filled 2015-01-06: qty 20

## 2015-01-06 MED ORDER — LACTATED RINGERS IV SOLN
INTRAVENOUS | Status: DC | PRN
  Administered 2015-01-06 (×2): via INTRAVENOUS

## 2015-01-06 MED ORDER — ACETAMINOPHEN 650 MG RE SUPP: 650.0000 mg | Freq: Four times a day (QID) | RECTAL | Status: DC | PRN

## 2015-01-06 MED ORDER — PROMETHAZINE HCL 25 MG/ML IJ SOLN: 6.2500 mg | INTRAMUSCULAR | Status: DC | PRN

## 2015-01-06 MED ORDER — SODIUM CHLORIDE 0.9 % IJ SOLN
INTRAMUSCULAR | Status: AC
  Filled 2015-01-06: qty 50

## 2015-01-06 MED ORDER — LIDOCAINE 4 % EX CREA
TOPICAL_CREAM | Freq: Four times a day (QID) | CUTANEOUS | Status: DC | PRN
  Filled 2015-01-06: qty 5

## 2015-01-06 MED ORDER — HYDROCODONE-ACETAMINOPHEN 5-325 MG PO TABS
1.0000 | ORAL_TABLET | ORAL | Status: DC | PRN
  Administered 2015-01-06: 1 via ORAL
  Administered 2015-01-07 – 2015-01-08 (×9): 2 via ORAL
  Filled 2015-01-06 (×9): qty 2
  Filled 2015-01-06: qty 1

## 2015-01-06 MED ORDER — HYDROMORPHONE HCL 1 MG/ML IJ SOLN
INTRAMUSCULAR | Status: AC
  Filled 2015-01-06: qty 1

## 2015-01-06 MED ORDER — DIPHENHYDRAMINE HCL 12.5 MG/5ML PO ELIX
12.5000 mg | ORAL_SOLUTION | ORAL | Status: DC | PRN
  Administered 2015-01-07: 12.5 mg via ORAL
  Administered 2015-01-07: 25 mg via ORAL
  Filled 2015-01-06 (×2): qty 10

## 2015-01-06 MED ORDER — SODIUM CHLORIDE 0.9 % IJ SOLN
INTRAMUSCULAR | Status: DC | PRN
  Administered 2015-01-06: 30 mL via INTRAVENOUS

## 2015-01-06 MED ORDER — LACTATED RINGERS IV SOLN: INTRAVENOUS | Status: DC

## 2015-01-06 MED ORDER — LIDOCAINE HCL (CARDIAC) 20 MG/ML IV SOLN
INTRAVENOUS | Status: AC
  Filled 2015-01-06: qty 5

## 2015-01-06 MED ORDER — MEPERIDINE HCL 50 MG/ML IJ SOLN: 6.2500 mg | INTRAMUSCULAR | Status: DC | PRN

## 2015-01-06 MED ORDER — EPHEDRINE SULFATE 50 MG/ML IJ SOLN
INTRAMUSCULAR | Status: AC
  Filled 2015-01-06: qty 1

## 2015-01-06 MED ORDER — ACETAMINOPHEN 325 MG PO TABS: 650.0000 mg | ORAL_TABLET | Freq: Four times a day (QID) | ORAL | Status: DC | PRN

## 2015-01-06 MED ORDER — ACETAMINOPHEN 10 MG/ML IV SOLN
1000.0000 mg | Freq: Once | INTRAVENOUS | Status: AC
  Administered 2015-01-06: 1000 mg via INTRAVENOUS
  Filled 2015-01-06: qty 100

## 2015-01-06 MED ORDER — EPHEDRINE SULFATE 50 MG/ML IJ SOLN
INTRAMUSCULAR | Status: DC | PRN
  Administered 2015-01-06: 10 mg via INTRAVENOUS

## 2015-01-06 MED ORDER — MENTHOL 3 MG MT LOZG: 1.0000 | LOZENGE | OROMUCOSAL | Status: DC | PRN

## 2015-01-06 MED ORDER — LIDOCAINE HCL (CARDIAC) 20 MG/ML IV SOLN
INTRAVENOUS | Status: DC | PRN
  Administered 2015-01-06: 100 mg via INTRAVENOUS

## 2015-01-06 MED ORDER — METOPROLOL SUCCINATE ER 25 MG PO TB24
25.0000 mg | ORAL_TABLET | Freq: Two times a day (BID) | ORAL | Status: DC
  Administered 2015-01-06 – 2015-01-08 (×4): 25 mg via ORAL
  Filled 2015-01-06 (×6): qty 1

## 2015-01-06 MED ORDER — SODIUM CHLORIDE 0.9 % IJ SOLN
INTRAMUSCULAR | Status: AC
  Filled 2015-01-06: qty 10

## 2015-01-06 MED ORDER — CHLORHEXIDINE GLUCONATE 4 % EX LIQD: 60.0000 mL | Freq: Once | CUTANEOUS | Status: DC

## 2015-01-06 MED ORDER — ROCURONIUM BROMIDE 100 MG/10ML IV SOLN
INTRAVENOUS | Status: AC
  Filled 2015-01-06: qty 1

## 2015-01-06 MED ORDER — DEXTROSE-NACL 5-0.9 % IV SOLN
INTRAVENOUS | Status: DC
  Administered 2015-01-06 (×2): via INTRAVENOUS

## 2015-01-06 MED ORDER — CEFAZOLIN SODIUM-DEXTROSE 2-3 GM-% IV SOLR
2.0000 g | Freq: Four times a day (QID) | INTRAVENOUS | Status: AC
  Administered 2015-01-06 (×2): 2 g via INTRAVENOUS
  Filled 2015-01-06 (×2): qty 50

## 2015-01-06 MED ORDER — FLEET ENEMA 7-19 GM/118ML RE ENEM: 1.0000 | ENEMA | Freq: Once | RECTAL | Status: AC | PRN

## 2015-01-06 MED ORDER — PROPOFOL 10 MG/ML IV BOLUS
INTRAVENOUS | Status: AC
  Filled 2015-01-06: qty 20

## 2015-01-06 MED ORDER — POLYETHYLENE GLYCOL 3350 17 G PO PACK: 17.0000 g | PACK | Freq: Every day | ORAL | Status: DC | PRN

## 2015-01-06 MED ORDER — ONDANSETRON HCL 4 MG/2ML IJ SOLN
INTRAMUSCULAR | Status: AC
  Filled 2015-01-06: qty 2

## 2015-01-06 MED ORDER — DEXAMETHASONE SODIUM PHOSPHATE 10 MG/ML IJ SOLN
10.0000 mg | Freq: Once | INTRAMUSCULAR | Status: AC
  Administered 2015-01-07: 10 mg via INTRAVENOUS
  Filled 2015-01-06: qty 1

## 2015-01-06 MED ORDER — ONDANSETRON HCL 4 MG PO TABS: 4.0000 mg | ORAL_TABLET | Freq: Four times a day (QID) | ORAL | Status: DC | PRN

## 2015-01-06 MED ORDER — CLONIDINE HCL (ANALGESIA) 100 MCG/ML EP SOLN: EPIDURAL | Status: DC | PRN

## 2015-01-06 MED ORDER — KETOROLAC TROMETHAMINE 15 MG/ML IJ SOLN
7.5000 mg | Freq: Four times a day (QID) | INTRAMUSCULAR | Status: AC | PRN
  Administered 2015-01-06: 7.5 mg via INTRAVENOUS
  Filled 2015-01-06: qty 1

## 2015-01-06 MED ORDER — DILTIAZEM HCL ER COATED BEADS 180 MG PO CP24
180.0000 mg | ORAL_CAPSULE | Freq: Every evening | ORAL | Status: DC
  Administered 2015-01-06 – 2015-01-07 (×2): 180 mg via ORAL
  Filled 2015-01-06 (×3): qty 1

## 2015-01-06 MED ORDER — ONDANSETRON HCL 4 MG/2ML IJ SOLN
4.0000 mg | Freq: Four times a day (QID) | INTRAMUSCULAR | Status: DC | PRN
  Administered 2015-01-06: 4 mg via INTRAVENOUS
  Filled 2015-01-06: qty 2

## 2015-01-06 MED ORDER — LORAZEPAM 0.5 MG PO TABS: 0.5000 mg | ORAL_TABLET | Freq: Every day | ORAL | Status: DC | PRN

## 2015-01-06 MED ORDER — METOCLOPRAMIDE HCL 10 MG PO TABS: 5.0000 mg | ORAL_TABLET | Freq: Three times a day (TID) | ORAL | Status: DC | PRN

## 2015-01-06 MED ORDER — MIDAZOLAM HCL 2 MG/2ML IJ SOLN
INTRAMUSCULAR | Status: AC
  Filled 2015-01-06: qty 2

## 2015-01-06 MED ORDER — BUPIVACAINE HCL 0.25 % IJ SOLN
INTRAMUSCULAR | Status: DC | PRN
  Administered 2015-01-06: 30 mL

## 2015-01-06 MED ORDER — DEXAMETHASONE SODIUM PHOSPHATE 10 MG/ML IJ SOLN: 10.0000 mg | Freq: Once | INTRAMUSCULAR | Status: DC

## 2015-01-06 MED ORDER — DEXAMETHASONE SODIUM PHOSPHATE 10 MG/ML IJ SOLN
INTRAMUSCULAR | Status: AC
  Filled 2015-01-06: qty 1

## 2015-01-06 MED ORDER — LIDOCAINE HCL 2 % EX GEL
1.0000 "application " | Freq: Four times a day (QID) | CUTANEOUS | Status: DC | PRN
  Administered 2015-01-06 – 2015-01-07 (×3): 1 via TOPICAL
  Filled 2015-01-06 (×4): qty 5

## 2015-01-06 MED ORDER — ONDANSETRON HCL 4 MG/2ML IJ SOLN
INTRAMUSCULAR | Status: DC | PRN
  Administered 2015-01-06: 4 mg via INTRAVENOUS

## 2015-01-06 MED ORDER — 0.9 % SODIUM CHLORIDE (POUR BTL) OPTIME
TOPICAL | Status: DC | PRN
  Administered 2015-01-06: 1000 mL

## 2015-01-06 MED ORDER — HYDROMORPHONE HCL 1 MG/ML IJ SOLN
0.2500 mg | INTRAMUSCULAR | Status: DC | PRN
  Administered 2015-01-06 (×4): 0.5 mg via INTRAVENOUS

## 2015-01-06 MED ORDER — BUPIVACAINE HCL (PF) 0.25 % IJ SOLN
INTRAMUSCULAR | Status: AC
  Filled 2015-01-06: qty 30

## 2015-01-06 MED ORDER — OXYCODONE HCL 5 MG PO TABS
5.0000 mg | ORAL_TABLET | ORAL | Status: DC | PRN
  Administered 2015-01-06: 10 mg via ORAL
  Administered 2015-01-06 (×2): 5 mg via ORAL
  Filled 2015-01-06: qty 1
  Filled 2015-01-06 (×2): qty 2

## 2015-01-06 MED ORDER — SODIUM CHLORIDE 0.9 % IR SOLN
Status: DC | PRN
  Administered 2015-01-06: 1000 mL

## 2015-01-06 MED ORDER — SODIUM CHLORIDE 0.9 % IV SOLN
INTRAVENOUS | Status: DC
Start: 2015-01-06 — End: 2015-01-06

## 2015-01-06 MED ORDER — MIDAZOLAM HCL 5 MG/5ML IJ SOLN
INTRAMUSCULAR | Status: DC | PRN
  Administered 2015-01-06: 2 mg via INTRAVENOUS

## 2015-01-06 MED ORDER — ACETAMINOPHEN 500 MG PO TABS
1000.0000 mg | ORAL_TABLET | Freq: Four times a day (QID) | ORAL | Status: AC
  Administered 2015-01-06 (×2): 1000 mg via ORAL
  Filled 2015-01-06 (×2): qty 2

## 2015-01-06 MED ORDER — SUCCINYLCHOLINE CHLORIDE 20 MG/ML IJ SOLN
INTRAMUSCULAR | Status: DC | PRN
  Administered 2015-01-06: 100 mg via INTRAVENOUS

## 2015-01-06 MED ORDER — TRAMADOL HCL 50 MG PO TABS
50.0000 mg | ORAL_TABLET | Freq: Four times a day (QID) | ORAL | Status: DC | PRN
  Administered 2015-01-06: 100 mg via ORAL
  Filled 2015-01-06: qty 2

## 2015-01-06 MED ORDER — BISACODYL 10 MG RE SUPP: 10.0000 mg | Freq: Every day | RECTAL | Status: DC | PRN

## 2015-01-06 MED ORDER — TRANEXAMIC ACID 100 MG/ML IV SOLN
2000.0000 mg | Freq: Once | INTRAVENOUS | Status: DC
  Filled 2015-01-06: qty 20

## 2015-01-06 MED ORDER — BUPIVACAINE LIPOSOME 1.3 % IJ SUSP
INTRAMUSCULAR | Status: DC | PRN
  Administered 2015-01-06: 20 mL

## 2015-01-06 MED ORDER — METOCLOPRAMIDE HCL 5 MG/ML IJ SOLN: 5.0000 mg | Freq: Three times a day (TID) | INTRAMUSCULAR | Status: DC | PRN

## 2015-01-06 MED ORDER — PHENOL 1.4 % MT LIQD: 1.0000 | OROMUCOSAL | Status: DC | PRN

## 2015-01-06 MED ORDER — ROSUVASTATIN CALCIUM 10 MG PO TABS
10.0000 mg | ORAL_TABLET | Freq: Every evening | ORAL | Status: DC
  Administered 2015-01-06 – 2015-01-07 (×2): 10 mg via ORAL
  Filled 2015-01-06 (×3): qty 1

## 2015-01-06 MED ORDER — PROPOFOL 10 MG/ML IV BOLUS
INTRAVENOUS | Status: DC | PRN
  Administered 2015-01-06: 200 mg via INTRAVENOUS

## 2015-01-06 MED ORDER — BUPIVACAINE IN DEXTROSE 0.75-8.25 % IT SOLN
INTRATHECAL | Status: DC | PRN
  Administered 2015-01-06: 1.8 mL via INTRATHECAL

## 2015-01-06 MED ORDER — RIVAROXABAN 10 MG PO TABS
10.0000 mg | ORAL_TABLET | Freq: Every day | ORAL | Status: DC
  Administered 2015-01-07 – 2015-01-08 (×2): 10 mg via ORAL
  Filled 2015-01-06 (×3): qty 1

## 2015-01-06 MED ORDER — TRANEXAMIC ACID 100 MG/ML IV SOLN
2000.0000 mg | INTRAVENOUS | Status: DC | PRN
  Administered 2015-01-06: 2000 mg via TOPICAL

## 2015-01-06 MED ORDER — FENTANYL CITRATE 0.05 MG/ML IJ SOLN
INTRAMUSCULAR | Status: DC | PRN
Start: 2015-01-06 — End: 2015-01-06
  Administered 2015-01-06 (×5): 50 ug via INTRAVENOUS

## 2015-01-06 MED ORDER — METHOCARBAMOL 500 MG PO TABS
500.0000 mg | ORAL_TABLET | Freq: Four times a day (QID) | ORAL | Status: DC | PRN
  Administered 2015-01-06 – 2015-01-08 (×6): 500 mg via ORAL
  Filled 2015-01-06 (×7): qty 1

## 2015-01-06 MED ORDER — METHOCARBAMOL 1000 MG/10ML IJ SOLN
500.0000 mg | Freq: Four times a day (QID) | INTRAVENOUS | Status: DC | PRN
  Administered 2015-01-06: 500 mg via INTRAVENOUS
  Filled 2015-01-06 (×2): qty 5

## 2015-01-06 MED ORDER — MORPHINE SULFATE 2 MG/ML IJ SOLN
1.0000 mg | INTRAMUSCULAR | Status: DC | PRN
  Administered 2015-01-06 – 2015-01-08 (×4): 1 mg via INTRAVENOUS
  Filled 2015-01-06 (×3): qty 1

## 2015-01-06 MED ORDER — CEFAZOLIN SODIUM-DEXTROSE 2-3 GM-% IV SOLR
2.0000 g | INTRAVENOUS | Status: AC
  Administered 2015-01-06: 2 g via INTRAVENOUS

## 2015-01-06 MED ORDER — FENTANYL CITRATE 0.05 MG/ML IJ SOLN
INTRAMUSCULAR | Status: AC
  Filled 2015-01-06: qty 5

## 2015-01-06 MED ORDER — DEXAMETHASONE SODIUM PHOSPHATE 4 MG/ML IJ SOLN
INTRAMUSCULAR | Status: DC | PRN
  Administered 2015-01-06: 10 mg via INTRAVENOUS

## 2015-01-06 MED ORDER — DOCUSATE SODIUM 100 MG PO CAPS
100.0000 mg | ORAL_CAPSULE | Freq: Two times a day (BID) | ORAL | Status: DC
  Administered 2015-01-06 – 2015-01-08 (×5): 100 mg via ORAL

## 2015-01-06 MED ORDER — LORAZEPAM 0.5 MG PO TABS
0.5000 mg | ORAL_TABLET | Freq: Two times a day (BID) | ORAL | Status: DC | PRN
  Administered 2015-01-06 – 2015-01-08 (×2): 0.5 mg via ORAL
  Filled 2015-01-06 (×2): qty 1

## 2015-01-06 MED ORDER — CEFAZOLIN SODIUM-DEXTROSE 2-3 GM-% IV SOLR
INTRAVENOUS | Status: AC
  Filled 2015-01-06: qty 50

## 2015-01-06 SURGICAL SUPPLY — 62 items
BAG DECANTER FOR FLEXI CONT (MISCELLANEOUS) ×2 IMPLANT
BAG SPEC THK2 15X12 ZIP CLS (MISCELLANEOUS) ×1
BAG ZIPLOCK 12X15 (MISCELLANEOUS) ×2 IMPLANT
BANDAGE ELASTIC 6 VELCRO ST LF (GAUZE/BANDAGES/DRESSINGS) ×2 IMPLANT
BANDAGE ESMARK 6X9 LF (GAUZE/BANDAGES/DRESSINGS) ×1 IMPLANT
BLADE SAG 18X100X1.27 (BLADE) ×2 IMPLANT
BLADE SAW SGTL 11.0X1.19X90.0M (BLADE) ×2 IMPLANT
BNDG CMPR 9X6 STRL LF SNTH (GAUZE/BANDAGES/DRESSINGS) ×1
BNDG ESMARK 6X9 LF (GAUZE/BANDAGES/DRESSINGS) ×2
BOWL SMART MIX CTS (DISPOSABLE) ×2 IMPLANT
CAPT KNEE TOTAL 3 ATTUNE ×1 IMPLANT
CEMENT HV SMART SET (Cement) ×4 IMPLANT
CUFF TOURN SGL QUICK 34 (TOURNIQUET CUFF) ×2
CUFF TRNQT CYL 34X4X40X1 (TOURNIQUET CUFF) ×1 IMPLANT
DECANTER SPIKE VIAL GLASS SM (MISCELLANEOUS) ×2 IMPLANT
DRAPE EXTREMITY T 121X128X90 (DRAPE) ×2 IMPLANT
DRAPE POUCH INSTRU U-SHP 10X18 (DRAPES) ×2 IMPLANT
DRAPE U-SHAPE 47X51 STRL (DRAPES) ×2 IMPLANT
DRSG ADAPTIC 3X8 NADH LF (GAUZE/BANDAGES/DRESSINGS) ×2 IMPLANT
DRSG PAD ABDOMINAL 8X10 ST (GAUZE/BANDAGES/DRESSINGS) ×2 IMPLANT
DURAPREP 26ML APPLICATOR (WOUND CARE) ×2 IMPLANT
ELECT REM PT RETURN 9FT ADLT (ELECTROSURGICAL) ×2
ELECTRODE REM PT RTRN 9FT ADLT (ELECTROSURGICAL) ×1 IMPLANT
EVACUATOR 1/8 PVC DRAIN (DRAIN) ×2 IMPLANT
FACESHIELD WRAPAROUND (MASK) ×10 IMPLANT
FACESHIELD WRAPAROUND OR TEAM (MASK) ×5 IMPLANT
GAUZE SPONGE 4X4 12PLY STRL (GAUZE/BANDAGES/DRESSINGS) ×2 IMPLANT
GLOVE BIO SURGEON STRL SZ7.5 (GLOVE) IMPLANT
GLOVE BIO SURGEON STRL SZ8 (GLOVE) ×2 IMPLANT
GLOVE BIOGEL PI IND STRL 6.5 (GLOVE) IMPLANT
GLOVE BIOGEL PI IND STRL 8 (GLOVE) ×1 IMPLANT
GLOVE BIOGEL PI INDICATOR 6.5 (GLOVE)
GLOVE BIOGEL PI INDICATOR 8 (GLOVE) ×1
GLOVE SURG SS PI 6.5 STRL IVOR (GLOVE) IMPLANT
GOWN STRL REUS W/TWL LRG LVL3 (GOWN DISPOSABLE) ×2 IMPLANT
GOWN STRL REUS W/TWL XL LVL3 (GOWN DISPOSABLE) IMPLANT
HANDPIECE INTERPULSE COAX TIP (DISPOSABLE) ×2
IMMOBILIZER KNEE 20 (SOFTGOODS) ×2
IMMOBILIZER KNEE 20 THIGH 36 (SOFTGOODS) ×1 IMPLANT
KIT BASIN OR (CUSTOM PROCEDURE TRAY) ×2 IMPLANT
MANIFOLD NEPTUNE II (INSTRUMENTS) ×2 IMPLANT
NDL SAFETY ECLIPSE 18X1.5 (NEEDLE) ×2 IMPLANT
NEEDLE HYPO 18GX1.5 SHARP (NEEDLE) ×4
NS IRRIG 1000ML POUR BTL (IV SOLUTION) ×2 IMPLANT
PACK TOTAL JOINT (CUSTOM PROCEDURE TRAY) ×2 IMPLANT
PAD ABD 8X10 STRL (GAUZE/BANDAGES/DRESSINGS) ×1 IMPLANT
PADDING CAST COTTON 6X4 STRL (CAST SUPPLIES) ×4 IMPLANT
PEN SKIN MARKING BROAD (MISCELLANEOUS) ×2 IMPLANT
POSITIONER SURGICAL ARM (MISCELLANEOUS) ×2 IMPLANT
SET HNDPC FAN SPRY TIP SCT (DISPOSABLE) ×1 IMPLANT
STRIP CLOSURE SKIN 1/2X4 (GAUZE/BANDAGES/DRESSINGS) ×3 IMPLANT
SUCTION FRAZIER 12FR DISP (SUCTIONS) ×2 IMPLANT
SUT MNCRL AB 4-0 PS2 18 (SUTURE) ×2 IMPLANT
SUT VIC AB 2-0 CT1 27 (SUTURE) ×6
SUT VIC AB 2-0 CT1 TAPERPNT 27 (SUTURE) ×3 IMPLANT
SUT VLOC 180 0 24IN GS25 (SUTURE) ×2 IMPLANT
SYR 20CC LL (SYRINGE) ×2 IMPLANT
SYR 50ML LL SCALE MARK (SYRINGE) ×3 IMPLANT
TOWEL OR 17X26 10 PK STRL BLUE (TOWEL DISPOSABLE) ×2 IMPLANT
TOWEL OR NON WOVEN STRL DISP B (DISPOSABLE) IMPLANT
WATER STERILE IRR 1500ML POUR (IV SOLUTION) ×2 IMPLANT
WRAP KNEE MAXI GEL POST OP (GAUZE/BANDAGES/DRESSINGS) ×2 IMPLANT

## 2015-01-06 NOTE — Op Note (Signed)
Pre-operative diagnosis- Osteoarthritis  Left knee(s)  Post-operative diagnosis- Osteoarthritis Left knee(s)  Procedure-  Left  Total Knee Arthroplasty (Attune system)  Surgeon- [name]. Mane Consolo, MD  Assistant- Ardeen Jourdain, PA-C   Anesthesia-  General and Spinal  EBL-* No blood loss amount entered *   Drains Hemovac  Tourniquet time-  35 minutes @ 672 mm Hg   Complications- None  Condition-PACU - hemodynamically stable.   Brief Clinical Note   Robert [name] is a 69 y.o. year old male with end stage OA of his left knee with progressively worsening pain and dysfunction. He has constant pain, with activity and at rest and significant functional deficits with difficulties even with ADLs. He has had extensive non-op management including analgesics, injections of cortisone, and home exercise program, but remains in significant pain with significant dysfunction. Radiographs show bone on bone arthritis medial and patellofemoral. He presents now for left Total Knee Arthroplasty.     Procedure in detail---   The patient is brought into the operating room and positioned supine on the operating table. After successful administration of  General and Spinal,   a tourniquet is placed high on the  Left thigh(s) and the lower extremity is prepped and draped in the usual sterile fashion. Time out is performed by the operating team and then the  Left lower extremity is wrapped in Esmarch, knee flexed and the tourniquet inflated to 300 mmHg.       A midline incision is made with a ten blade through the subcutaneous tissue to the level of the extensor mechanism. A fresh blade is used to make a medial parapatellar arthrotomy. Soft tissue over the proximal medial tibia is subperiosteally elevated to the joint line with a knife and into the semimembranosus bursa with a Cobb elevator. Soft tissue over the proximal lateral tibia is elevated with attention being paid to avoiding the patellar tendon on the  tibial tubercle. The patella is everted, knee flexed 90 degrees and the ACL and PCL are removed. Findings are bone on bone medial and patellofemoral with large medial osteophytes.        The drill is used to create a starting hole in the distal femur and the canal is thoroughly irrigated with sterile saline to remove the fatty contents. The 5 degree Left  valgus alignment guide is placed into the femoral canal and the distal femoral cutting block is pinned to remove 9 mm off the distal femur. Resection is made with an oscillating saw.      The tibia is subluxed forward and the menisci are removed. The extramedullary alignment guide is placed referencing proximally at the medial aspect of the tibial tubercle and distally along the second metatarsal axis and tibial crest. The block is pinned to remove 7mm off the more deficient medial  side. Resection is made with an oscillating saw. Size 7is the most appropriate size for the tibia and the proximal tibia is prepared with the modular drill and keel punch for that size.      The femoral sizing guide is placed and size 7 is most appropriate. Rotation is marked off the epicondylar axis and confirmed by creating a rectangular flexion gap at 90 degrees. The size 7 cutting block is pinned in this rotation and the anterior, posterior and chamfer cuts are made with the oscillating saw. The intercondylar block is then placed and that cut is made.      Trial size 7 tibial component, trial size 7 posterior stabilized  femur and a 10  mm posterior stabilized rotating platform insert trial is placed. Full extension is achieved with excellent varus/valgus and anterior/posterior balance throughout full range of motion. The patella is everted and thickness measured to be 25  mm. Free hand resection is taken to 15 mm, a 38 template is placed, lug holes are drilled, trial patella is placed, and it tracks normally. Osteophytes are removed off the posterior femur with the trial in  place. All trials are removed and the cut bone surfaces prepared with pulsatile lavage. Cement is mixed and once ready for implantation, the size 7 tibial implant, size  7 posterior stabilized femoral component, and the size 38 patella are cemented in place and the patella is held with the clamp. The trial insert is placed and the knee held in full extension. The Exparel (20 ml mixed with 30 ml saline) and .25% Bupivicaine, are injected into the extensor mechanism, posterior capsule, medial and lateral gutters and subcutaneous tissues.  All extruded cement is removed and once the cement is hard the permanent 10 mm posterior stabilized rotating platform insert is placed into the tibial tray.      The wound is copiously irrigated with saline solution and the extensor mechanism closed over a hemovac drain with #1 V-loc suture. The tourniquet is released for a total tourniquet time of 35  minutes. Flexion against gravity is 140 degrees and the patella tracks normally. Subcutaneous tissue is closed with 2.0 vicryl and subcuticular with running 4.0 Monocryl. The incision is cleaned and dried and steri-strips and a bulky sterile dressing are applied. The limb is placed into a knee immobilizer and the patient is awakened and transported to recovery in stable condition.      Please note that a surgical assistant was a medical necessity for this procedure in order to perform it in a safe and expeditious manner. Surgical assistant was necessary to retract the ligaments and vital neurovascular structures to prevent injury to them and also necessary for proper positioning of the limb to allow for anatomic placement of the prosthesis.   [name] [name], MD    01/06/2015, 8:16 AM

## 2015-01-06 NOTE — Interval H&P Note (Signed)
History and Physical Interval Note:  01/06/2015 6:48 AM  Robert [name]  has presented today for surgery, with the diagnosis of OA LEFT KNEE  The various methods of treatment have been discussed with the patient and family. After consideration of risks, benefits and other options for treatment, the patient has consented to  Procedure(s): LEFT TOTAL KNEE ARTHROPLASTY (Left) as a surgical intervention .  The patient's history has been reviewed, patient examined, no change in status, stable for surgery.  I have reviewed the patient's chart and labs.  Questions were answered to the patient's satisfaction.     [name]

## 2015-01-06 NOTE — Anesthesia Preprocedure Evaluation (Addendum)
Anesthesia Evaluation  Patient identified by MRN, date of birth, ID band Patient awake    Reviewed: Allergy & Precautions, NPO status , Patient's Chart, lab work & pertinent test results  Airway Mallampati: II  TM Distance: >3 FB Neck ROM: Full    Dental no notable dental hx.    Pulmonary sleep apnea and Continuous Positive Airway Pressure Ventilation ,  breath sounds clear to auscultation  Pulmonary exam normal       Cardiovascular hypertension, Pt. on medications - CAD Rhythm:Regular Rate:Normal     Neuro/Psych negative neurological ROS  negative psych ROS   GI/Hepatic negative GI ROS, Neg liver ROS,   Endo/Other  negative endocrine ROS  Renal/GU negative Renal ROS  negative genitourinary   Musculoskeletal negative musculoskeletal ROS (+)   Abdominal   Peds negative pediatric ROS (+)  Hematology negative hematology ROS (+)   Anesthesia Other Findings   Reproductive/Obstetrics negative OB ROS                            Anesthesia Physical Anesthesia Plan  ASA: II  Anesthesia Plan: Spinal   Post-op Pain Management:    Induction:   Airway Management Planned: Simple Face Mask  Additional Equipment:   Intra-op Plan:   Post-operative Plan:   Informed Consent: I have reviewed the patients History and Physical, chart, labs and discussed the procedure including the risks, benefits and alternatives for the proposed anesthesia with the patient or authorized representative who has indicated his/her understanding and acceptance.   Dental advisory given  Plan Discussed with: CRNA  Anesthesia Plan Comments:         Anesthesia Quick Evaluation

## 2015-01-06 NOTE — Progress Notes (Signed)
Patient declines the use of nocturnal CPAP tonight. He did bring his mask and tubing from home, but wishes to try tomorrow night instead of tonight. RT will continue to follow.

## 2015-01-06 NOTE — H&P (View-Only) (Signed)
Robert [name] DOB: 09-25-1946 Married / Language: English / Race: White Male Date of Admission:  01/06/2015 CC:  Left Knee Pain History of Present Illness The patient is a 69 year old male who comes in for a preoperative History and Physical. The patient is scheduled for a left total knee arthroplasty to be performed by Dr. [name]. Aluisio, MD at [facility] on 01-06-2015. The patient is a 69 year old male who presents for follow up of their knee. The patient is being followed for their bilateral knee pain and osteoarthritis. Symptoms reported today include: pain (lt knee) and stiffness (lt knee). The patient feels that they are doing poorly. Current treatment includes: NSAIDs (prn). The following medication has been used for pain control: antiinflammatory medication (Naproxen (prn)). The patient presented post Euflexxa series bilat knees. Doing better w/ right knee, but the left knee is still bothering him alot. He recently went to [facility] and had quite a bit of pain in the left knee. He feels that the right knee is going to continue to respond well to the Euflexxa. The left knee is already wearing off. He is having considerable problems with his left knee. The Euflexxa helped the right knee but not the left. He is scheduled for surgery for the left knee at this time. They have been treated conservatively in the past for the above stated problem and despite conservative measures, they continue to have progressive pain and severe functional limitations and dysfunction. They have failed non-operative management including home exercise, medications, and injections. It is felt that they would benefit from undergoing total joint replacement. Risks and benefits of the procedure have been discussed with the patient and they elect to proceed with surgery. There are no active contraindications to surgery such as ongoing infection or rapidly progressive neurological disease.  Problem List/Past  Medical Left knee pain (M25.562) Primary osteoarthritis of both knees (M17.0) Laceration of elbow, left (S51.012A) Knee osteoarthritis (EUFLEXXA) (M17.9) Osteoarthritis, Knee (715.96) Kidney Stone Hypercholesterolemia High blood pressure Peripheral Neuropathy Colon Cancer Stage III Sleep Apnea uses CPAP Enlarged prostate BPH Neuropathy secondary to chemotherapy Palpitations Anxiety Disorder  Allergies No Known Drug Allergies  Family History  Congestive Heart Failure mother Hypertension father father, sister and brother Heart Disease First Degree Relatives. mother and father Father Deceased, Myocardial infarction. age 66 Mother Deceased. age 11, multiple medical issues  Social History  Drug/Alcohol Rehab (Previously) no Current work status working full time Drug/Alcohol Rehab (Currently) no Tobacco use Never smoker. never smoker Children 5 or more Number of flights of stairs before winded greater than 5 Living situation live with spouse Illicit drug use no Tobacco / smoke exposure no Pain Contract no Exercise Exercises weekly; does running / walking and gym / weights Exercises daily; does running / walking and gym / weights Alcohol use current drinker; drinks beer, wine and hard liquor; 15 or more per week current drinker; drinks wine and hard liquor; 8-14 per week Marital status married Advance Directives Living Will, [place] with Wife  Medication History Metoprolol Succinate ER (25MG  Tablet ER 24HR, Oral) Active. Lisinopril-Hydrochlorothiazide (20-12.5MG  Tablet, Oral) Active. Fish Oil Active. Multivitamins (Oral) Active. Glucosamine Chondroitin Complx (Oral) Active. Crestor (10MG  Tablet, Oral) Active. Lisinopril (20MG  Tablet, Oral) Active. Aspirin EC (81MG  Tablet DR, Oral) Active. Colon Health Probiotic Active. B complex vitamin for neuropathy- unsure of the name Active. L-Methyl-MC  NAC (Oral) Specific dose unknown - Active. Cialis (5MG  Tablet, Oral) Active.  Past Surgical History Prostatectomy;  Transurethral Date: 1996. Colectomy Date: 2004. partial partial and complete Cataract Surgery bilateral Appendectomy Spinal Surgery Date: 1993. Vasectomy Colon Polyp Removal - Colonoscopy Repair of Dettached Retina Date: 1999. Cardiac Catherization Date: 1997. Negative Cath Left Elbow Infected Olecranon Bursitis Date: 2011.  Review of Systems General Not Present- Chills, Fatigue, Fever, Memory Loss, Night Sweats, Weight Gain and Weight Loss. Skin Not Present- Eczema, Hives, Itching, Lesions and Rash. HEENT Not Present- Dentures, Double Vision, Headache, Hearing Loss, Tinnitus and Visual Loss. Respiratory Not Present- Allergies, Chronic Cough, Coughing up blood, Shortness of breath at rest and Shortness of breath with exertion. Cardiovascular Not Present- Chest Pain, Difficulty Breathing Lying Down, Murmur, Palpitations, Racing/skipping heartbeats and Swelling. Gastrointestinal Not Present- Abdominal Pain, Bloody Stool, Constipation, Diarrhea, Difficulty Swallowing, Heartburn, Jaundice, Loss of appetitie, Nausea and Vomiting. Male Genitourinary Present- Urinating at Night. Not Present- Blood in Urine, Discharge, Flank Pain, Incontinence, Painful Urination, Urgency, Urinary frequency, Urinary Retention and Weak urinary stream. Musculoskeletal Present- Joint Pain. Not Present- Back Pain, Joint Swelling, Morning Stiffness, Muscle Pain, Muscle Weakness and Spasms. Neurological Not Present- Blackout spells, Difficulty with balance, Dizziness, Paralysis, Tremor and Weakness. Psychiatric Not Present- Insomnia.  Vitals Weight: 168 lb Height: 68.5in Body Surface Area: 1.91 m Body Mass Index: 25.17 kg/m   Physical Exam General Mental Status -Alert, cooperative and good historian. General Appearance-pleasant, Not in acute distress. Orientation-Oriented  X3. Build & Nutrition-Well nourished and Well developed.  Head and Neck Head-normocephalic, atraumatic . Neck Global Assessment - supple, no bruit auscultated on the right, no bruit auscultated on the left.  Eye Pupil - Bilateral-Regular and Round. Motion - Bilateral-EOMI.  Chest and Lung Exam Auscultation Breath sounds - clear at anterior chest wall and clear at posterior chest wall. Adventitious sounds - No Adventitious sounds.  Cardiovascular Auscultation Rhythm - Regular rate and rhythm. Heart Sounds - S1 WNL and S2 WNL. Murmurs & Other Heart Sounds - Auscultation of the heart reveals - No Murmurs.  Abdomen Palpation/Percussion Tenderness - Abdomen is non-tender to palpation. Rigidity (guarding) - Abdomen is soft. Auscultation Auscultation of the abdomen reveals - Bowel sounds normal.  Male Genitourinary Note: Not done, not pertinent to present illness  Musculoskeletal Note: On examination, he is alert and oriented in no apparent distress. His left knee shows no effusion. There is moderate crepitus on range of motion. Range is about 5 to 125. He has got slight varus deformity, tenderness medial greater than lateral with no instability. The right knee has no effusion and range is zero to 125. There is slight crepitus on range of motion and some tenderness medial greater than lateral with no instability noted.  Assessment & Plan  Primary osteoarthritis of both knees (M17.0) Note:Surgical Plans: Left Total Knee Repalcement  Disposition: Home  PCP: Dr. Lendon Ka. [name] - Patient has been seen preoperatively and felt to be stable for surgery. Cards: Dr. Peter Martinique - Patient has been seen preoperatively and felt to be stable for surgery.  Topical TXA  Signed electronically by Joelene Millin, III PA-C

## 2015-01-06 NOTE — Anesthesia Procedure Notes (Addendum)
Spinal Patient location during procedure: OR End time: 01/06/2015 7:12 AM Staffing Resident/CRNA: Maxwell Caul Performed by: resident/CRNA  Preanesthetic Checklist Completed: patient identified, site marked, surgical consent, pre-op evaluation, timeout performed, IV checked, risks and benefits discussed and monitors and equipment checked Spinal Block Patient position: sitting Prep: Betadine and Back cleaned off well with Saline after Spinal placement.  Patient monitoring: heart rate, continuous pulse ox and blood pressure Location: L3-4 Injection technique: single-shot Needle Needle type: Sprotte  Needle gauge: 22 G Needle length: 10 cm Additional Notes Expiration date of kit checked and confirmed. Patient tolerated procedure well, without complications. Expiration Date-05/2016, LOT # [id number] Prior to prepping patient for procedure, patient able to lift leg, and denied numbness. Dr Marcell Barlow called and proceeded with General Anesthesia. Dr Wynelle Link at bedside and aware.   Procedure Name: Intubation Date/Time: 01/06/2015 7:26 AM Performed by: Maxwell Caul Pre-anesthesia Checklist: Patient identified, Emergency Drugs available, Suction available and Patient being monitored Patient Re-evaluated:Patient Re-evaluated prior to inductionOxygen Delivery Method: Circle System Utilized Preoxygenation: Pre-oxygenation with 100% oxygen Intubation Type: IV induction Ventilation: Mask ventilation without difficulty Laryngoscope Size: Mac and 4 Grade View: Grade I Tube type: Oral Tube size: 7.5 mm Number of attempts: 1 Airway Equipment and Method: Stylet and Oral airway Placement Confirmation: ETT inserted through vocal cords under direct vision,  positive ETCO2 and breath sounds checked- equal and bilateral Secured at: 21 cm Tube secured with: Tape Dental Injury: Teeth and Oropharynx as per pre-operative assessment

## 2015-01-06 NOTE — Evaluation (Signed)
Physical Therapy Evaluation Patient Details Name: Robert [name] MRN: [id number] DOB: April 16, 1946 Today's Date: 01/06/2015   History of Present Illness  L tka  Clinical Impression  Patient tolerated mobility today, amb. X 25". Patient will benefit from PT to address problems listed  In note below.    Follow Up Recommendations Home health PT;Supervision/Assistance - 24 hour    Equipment Recommendations  Rolling walker with 5" wheels;Crutches    Recommendations for Other Services       Precautions / Restrictions Precautions Precautions: Knee;Fall Required Braces or Orthoses: Knee Immobilizer - Left Knee Immobilizer - Left: Discontinue once straight leg raise with < 10 degree lag      Mobility  Bed Mobility Overal bed mobility: Needs Assistance Bed Mobility: Supine to Sit     Supine to sit: Min assist     General bed mobility comments: cues for technique, support L leg to floor  Transfers Overall transfer level: Needs assistance Equipment used: Rolling walker (2 wheeled) Transfers: Sit to/from Stand Sit to Stand: Min assist;From elevated surface         General transfer comment: cues for hand and L leg position,   Ambulation/Gait Ambulation/Gait assistance: Min assist Ambulation Distance (Feet): 25 Feet Assistive device: Rolling walker (2 wheeled) Gait Pattern/deviations: Step-to pattern;Decreased step length - left;Decreased stance time - left;Antalgic     General Gait Details: cues for sequence, posture, position inside of RW.  Stairs            Wheelchair Mobility    Modified Rankin (Stroke Patients Only)       Balance                                             Pertinent Vitals/Pain Pain Assessment: 0-10 Pain Score: 2  Pain Descriptors / Indicators: Aching;Sore Pain Intervention(s): Monitored during session;Premedicated before session;Repositioned;Ice applied    Home Living Family/patient expects to be discharged  to:: Private residence Living Arrangements: Spouse/significant other Available Help at Discharge: Family Type of Home: House Home Access: Stairs to enter Entrance Stairs-Rails: None Entrance Stairs-Number of Steps: 3 Home Layout: Two level Home Equipment: None      Prior Function Level of Independence: Independent               Hand Dominance        Extremity/Trunk Assessment               Lower Extremity Assessment: LLE deficits/detail   LLE Deficits / Details: able to lift Leg from bed x 6 "w/out assist.     Communication   Communication: No difficulties  Cognition Arousal/Alertness: Awake/alert Behavior During Therapy: WFL for tasks assessed/performed Overall Cognitive Status: Within Functional Limits for tasks assessed                      General Comments      Exercises        Assessment/Plan    PT Assessment Patient needs continued PT services  PT Diagnosis Difficulty walking;Acute pain   PT Problem List Decreased strength;Decreased range of motion;Decreased activity tolerance;Decreased mobility;Pain  PT Treatment Interventions DME instruction;Gait training;Stair training;Functional mobility training;Therapeutic activities;Therapeutic exercise;Patient/family education   PT Goals (Current goals can be found in the Care Plan section) Acute Rehab PT Goals Patient Stated Goal: to go home PT Goal Formulation: With patient/family Time For Goal Achievement:  01/13/15 Potential to Achieve Goals: Good    Frequency 7X/week   Barriers to discharge        Co-evaluation               End of Session Equipment Utilized During Treatment: Gait belt;Left knee immobilizer Activity Tolerance: Patient tolerated treatment well Patient left: in chair;with call bell/phone within reach;with family/visitor present Nurse Communication: Mobility status         Time: 4765-4650 PT Time Calculation (min) (ACUTE ONLY): 27 min   Charges:   PT  Evaluation $Initial PT Evaluation Tier I: 1 Procedure PT Treatments $Gait Training: 8-22 mins   PT G Codes:        Robert [name] 01/06/2015, 6:16 PM Robert [name] PT [phone]

## 2015-01-06 NOTE — Transfer of Care (Signed)
Immediate Anesthesia Transfer of Care Note  Patient: Robert [name]  Procedure(s) Performed: Procedure(s) (LRB): LEFT TOTAL KNEE ARTHROPLASTY (Left)  Patient Location: PACU  Anesthesia Type: General and Spinal  Level of Consciousness: sedated, patient cooperative and responds to stimulation  Airway & Oxygen Therapy: Patient Spontanous Breathing and Patient connected to face mask oxgen  Post-op Assessment: Report given to PACU RN and Post -op Vital signs reviewed and stable  Post vital signs: Reviewed and stable  Complications: No apparent anesthesia complications

## 2015-01-06 NOTE — Anesthesia Postprocedure Evaluation (Signed)
  Anesthesia Post-op Note  Patient: Robert [name]  Procedure(s) Performed: Procedure(s) (LRB): LEFT TOTAL KNEE ARTHROPLASTY (Left)  Patient Location: PACU  Anesthesia Type: Spinal  Level of Consciousness: awake and alert   Airway and Oxygen Therapy: Patient Spontanous Breathing  Post-op Pain: mild  Post-op Assessment: Post-op Vital signs reviewed, Patient's Cardiovascular Status Stable, Respiratory Function Stable, Patent Airway and No signs of Nausea or vomiting  Last Vitals:  Filed Vitals:   01/06/15 1300  BP: 111/59  Pulse: 65  Temp: 36.4 C  Resp: 14    Post-op Vital Signs: stable   Complications: No apparent anesthesia complications

## 2015-01-06 NOTE — Plan of Care (Signed)
Problem: Consults Goal: Diagnosis- Total Joint Replacement Outcome: Completed/Met Date Met:  01/06/15 Primary Total Knee

## 2015-01-06 NOTE — Progress Notes (Signed)
Utilization review completed.  

## 2015-01-07 LAB — BASIC METABOLIC PANEL
Anion gap: 5 (ref 5–15)
BUN: 16 mg/dL (ref 6–23)
CALCIUM: 9.1 mg/dL (ref 8.4–10.5)
CO2: 26 mmol/L (ref 19–32)
Chloride: 103 mmol/L (ref 96–112)
Creatinine, Ser: 1.03 mg/dL (ref 0.50–1.35)
GFR calc Af Amer: 85 mL/min — ABNORMAL LOW (ref >90)
GFR calc non Af Amer: 73 mL/min — ABNORMAL LOW (ref >90)
Glucose, Bld: 169 mg/dL — ABNORMAL HIGH (ref 70–99)
Potassium: 4.1 mmol/L (ref 3.5–5.1)
SODIUM: 134 mmol/L — AB (ref 135–145)

## 2015-01-07 LAB — URINE MICROSCOPIC-ADD ON

## 2015-01-07 LAB — URINALYSIS, ROUTINE W REFLEX MICROSCOPIC
BILIRUBIN URINE: NEGATIVE
Glucose, UA: 100 mg/dL — AB
Ketones, ur: NEGATIVE mg/dL
Leukocytes, UA: NEGATIVE
NITRITE: NEGATIVE
PH: 5.5 (ref 5.0–8.0)
Protein, ur: NEGATIVE mg/dL
SPECIFIC GRAVITY, URINE: 1.013 (ref 1.005–1.030)
Urobilinogen, UA: 0.2 mg/dL (ref 0.0–1.0)

## 2015-01-07 LAB — CBC
HCT: 34.2 % — ABNORMAL LOW (ref 39.0–52.0)
Hemoglobin: 11.2 g/dL — ABNORMAL LOW (ref 13.0–17.0)
MCH: 29.1 pg (ref 26.0–34.0)
MCHC: 32.7 g/dL (ref 30.0–36.0)
MCV: 88.8 fL (ref 78.0–100.0)
Platelets: 157 10*3/uL (ref 150–400)
RBC: 3.85 MIL/uL — ABNORMAL LOW (ref 4.22–5.81)
RDW: 12.8 % (ref 11.5–15.5)
WBC: 12.5 10*3/uL — ABNORMAL HIGH (ref 4.0–10.5)

## 2015-01-07 MED ORDER — PHENAZOPYRIDINE HCL 100 MG PO TABS
100.0000 mg | ORAL_TABLET | Freq: Three times a day (TID) | ORAL | Status: DC
  Administered 2015-01-07: 100 mg via ORAL
  Filled 2015-01-07 (×3): qty 1

## 2015-01-07 MED ORDER — PHENAZOPYRIDINE HCL 100 MG PO TABS
100.0000 mg | ORAL_TABLET | Freq: Three times a day (TID) | ORAL | Status: DC
  Administered 2015-01-07 – 2015-01-08 (×2): 100 mg via ORAL
  Filled 2015-01-07 (×5): qty 1

## 2015-01-07 NOTE — Evaluation (Signed)
Occupational Therapy Evaluation Patient Details Name: Robert [name] MRN: [id number] DOB: December 30, 1945 Today's Date: 01/07/2015    History of Present Illness Pt is 69 y/o male s/p L TKA   Clinical Impression   Pt was admitted for the above surgery.  All education was completed.  No further OT is needed at this time.    Follow Up Recommendations  No OT follow up;Supervision/Assistance - 24 hour    Equipment Recommendations  3 in 1 bedside comode (if he does not still have one at home)    Recommendations for Other Services       Precautions / Restrictions Precautions Precautions: Knee;Fall Precaution Comments: pt able to perform straight leg raise Restrictions Weight Bearing Restrictions: No Other Position/Activity Restrictions: WBAT      Mobility Bed Mobility Overal bed mobility: Needs Assistance Bed Mobility: Supine to Sit;Sit to Supine     Supine to sit: Min guard    General bed mobility comments: for safety.  Pt was able to get both legs off bed.  HOB raised  Transfers Overall transfer level: Needs assistance Equipment used: Rolling walker (2 wheeled) Transfers: Sit to/from Stand Sit to Stand: Min guard         General transfer comment: for safety.  Cues for hand and LE placement    Balance                                            ADL Overall ADL's : Needs assistance/impaired                         Toilet Transfer: Min guard;Ambulation;BSC   Toileting- Water quality scientist and Hygiene: Min guard;Sit to/from stand   Tub/ Shower Transfer: Walk-in shower;Min guard;Ambulation     General ADL Comments: Pt will have wife's assistance for a week then son and friend.  Showed AE:  pt is interested in sock aide and long sponge:  demonstrated these.  Pt is able to complete UB adls with set up and mod A for LB adls.      Vision     Perception     Praxis      Pertinent Vitals/Pain Pain Assessment: 0-10 Pain Score:  2  Pain Location: L knee Pain Descriptors / Indicators: Sore Pain Intervention(s): Limited activity within patient's tolerance;Monitored during session;Premedicated before session;Repositioned     Hand Dominance     Extremity/Trunk Assessment Upper Extremity Assessment Upper Extremity Assessment: Overall WFL for tasks assessed           Communication Communication Communication: No difficulties   Cognition Arousal/Alertness: Awake/alert Behavior During Therapy: WFL for tasks assessed/performed Overall Cognitive Status: Within Functional Limits for tasks assessed                     General Comments       Exercises       Shoulder Instructions      Home Living Family/patient expects to be discharged to:: Private residence Living Arrangements: Spouse/significant other                 Bathroom Shower/Tub: Occupational psychologist: Standard         Additional Comments: may have old 3:1 at home:  checking      Prior Functioning/Environment Level of Independence: Independent  OT Diagnosis: Generalized weakness;Acute pain   OT Problem List:     OT Treatment/Interventions:      OT Goals(Current goals can be found in the care plan section) Acute Rehab OT Goals Patient Stated Goal: to go home  OT Frequency:     Barriers to D/C:            Co-evaluation              End of Session    Activity Tolerance: Patient tolerated treatment well Patient left: in chair;with call bell/phone within reach   Time: 1149-1223 OT Time Calculation (min): 34 min Charges:  OT General Charges $OT Visit: 1 Procedure OT Evaluation $Initial OT Evaluation Tier I: 1 Procedure OT Treatments $Self Care/Home Management : 8-22 mins G-Codes:    Hailie Searight January 13, 2015, 12:50 PM   Lesle Chris, OTR/L [phone] 2015-01-13

## 2015-01-07 NOTE — Progress Notes (Signed)
Physical Therapy Treatment Note   01/07/15 1500  PT Visit Information  Assistance Needed +1  History of Present Illness Pt is 69 y/o male s/p L TKA  PT Time Calculation  PT Start Time (ACUTE ONLY) 1450 (Simultaneous filing. User may not have seen previous data.)  PT Stop Time (ACUTE ONLY) 1512 (Simultaneous filing. User may not have seen previous data.)  PT Time Calculation (min) (ACUTE ONLY) 22 min (Simultaneous filing. User may not have seen previous data.)  Subjective Data  Subjective Pt tolerated treatment well this afternoon. Pt completed bed mobility, transfers and ambulation (distance per pt). Pt was educated on safe stair technique and practiced stairs with min guard. Pt is progressing well with PT.   Patient Stated Goal to go home  Precautions  Precautions Knee;Fall  Precaution Comments pt able to perform straight leg raise  Restrictions  Weight Bearing Restrictions No  Other Position/Activity Restrictions WBAT  Pain Assessment  Pain Assessment 0-10  Pain Score (no score given)  Pain Location L knee  Pain Descriptors / Indicators Sore  Pain Intervention(s) Limited activity within patient's tolerance;Monitored during session;Premedicated before session;Ice applied  Cognition  Arousal/Alertness Awake/alert  Behavior During Therapy WFL for tasks assessed/performed  Overall Cognitive Status Within Functional Limits for tasks assessed  Bed Mobility  Overal bed mobility Needs Assistance  Bed Mobility Supine to Sit;Sit to Supine  Supine to sit Min guard  Sit to supine Min guard  General bed mobility comments min guard for safety  Transfers  Overall transfer level Needs assistance  Equipment used Rolling walker (2 wheeled)  Transfers Sit to/from Stand  Sit to Stand Min guard  General transfer comment verbal cues given for LE placement. min guard for safety  Ambulation/Gait  Ambulation/Gait assistance Min guard  Ambulation Distance (Feet) 260 Feet  Assistive device  Rolling walker (2 wheeled)  Gait Pattern/deviations Step-to pattern;Antalgic  General Gait Details verbal cues given for shorter steps. min guard for safety  Stairs Yes  Stairs assistance Min guard  Stair Management With crutches;One rail Right  Number of Stairs 4  General stair comments verbal cues and demonstration given for safe stair technique min guard for safety. performed the 4 steps twice.   PT - End of Session  Equipment Utilized During Treatment Gait belt  Activity Tolerance Patient tolerated treatment well  Patient left in bed;with call bell/phone within reach  Nurse Communication Mobility status  PT - Assessment/Plan  PT Plan Current plan remains appropriate  PT Frequency (ACUTE ONLY) 7X/week  Follow Up Recommendations Home health PT;Supervision/Assistance - 24 hour  PT equipment Rolling walker with 5" wheels;Crutches  PT Goal Progression  Progress towards PT goals Progressing toward goals  Acute Rehab PT Goals  PT Goal Formulation With patient/family  Time For Goal Achievement 01/13/15  Potential to Achieve Goals Good  PT General Charges  $$ ACUTE PT VISIT 1 Procedure  PT Treatments  $Gait Training 8-22 mins  Charlott Holler, SPT

## 2015-01-07 NOTE — Progress Notes (Signed)
Physical Therapy Treatment Patient Details Name: Robert [name] MRN: [id number] DOB: 1946/05/06 Today's Date: 01/07/2015    History of Present Illness Pt is 69 y/o male s/p L TKA    PT Comments    Pt tolerated treatment well. Pt was able to complete bed mobility, transfers, ambulation and exercises during treatment. Pt had slight increase in pain during ambulation. Will practice stairs this afternoon if pt pain permits. Pt is progressing well with PT.   Follow Up Recommendations  Home health PT;Supervision/Assistance - 24 hour     Equipment Recommendations  Rolling walker with 5" wheels;Crutches    Recommendations for Other Services       Precautions / Restrictions Precautions Precautions: Knee;Fall Precaution Comments: pt able to perform straight leg raise Restrictions Weight Bearing Restrictions: No Other Position/Activity Restrictions: WBAT    Mobility  Bed Mobility Overal bed mobility: Needs Assistance Bed Mobility: Supine to Sit;Sit to Supine     Supine to sit: Min guard Sit to supine: Min assist   General bed mobility comments: assist need to help L LE into bed.   Transfers Overall transfer level: Needs assistance Equipment used: Rolling walker (2 wheeled) Transfers: Sit to/from Stand Sit to Stand: Min guard         General transfer comment: min guard for safety. verbal cues given for hand placement and for L LE placement.   Ambulation/Gait Ambulation/Gait assistance: Min guard Ambulation Distance (Feet): 120 Feet Assistive device: Rolling walker (2 wheeled) Gait Pattern/deviations: Step-to pattern;Antalgic     General Gait Details: verbal cues given for RW distance. pt reported of increased pain during ambulation, but still tolerable (5/10)   Stairs            Wheelchair Mobility    Modified Rankin (Stroke Patients Only)       Balance                                    Cognition Arousal/Alertness:  Awake/alert Behavior During Therapy: WFL for tasks assessed/performed Overall Cognitive Status: Within Functional Limits for tasks assessed                      Exercises Total Joint Exercises Ankle Circles/Pumps: AROM;Both;15 reps Quad Sets: AROM;Left;10 reps Short Arc Quad: AROM;10 reps;Left Heel Slides: AAROM;10 reps;Left Hip ABduction/ADduction: AAROM;10 reps;Left Straight Leg Raises: AAROM;10 reps;Left Goniometric ROM: approx 70* AAROM during heel slides    General Comments        Pertinent Vitals/Pain Pain Assessment: 0-10 Pain Score: 5  Pain Location: L knee Pain Descriptors / Indicators: Sore;Aching Pain Intervention(s): Limited activity within patient's tolerance;Monitored during session;Ice applied;Premedicated before session    Home Living                      Prior Function            PT Goals (current goals can now be found in the care plan section) Acute Rehab PT Goals Patient Stated Goal: to go home PT Goal Formulation: With patient/family Time For Goal Achievement: 01/13/15 Potential to Achieve Goals: Good Progress towards PT goals: Progressing toward goals    Frequency  7X/week    PT Plan Current plan remains appropriate    Co-evaluation             End of Session Equipment Utilized During Treatment: Gait belt Activity Tolerance: Patient tolerated treatment well Patient  left: in bed;with call bell/phone within reach;with SCD's reapplied     Time: 1505-6979 PT Time Calculation (min) (ACUTE ONLY): 27 min  Charges:  $Gait Training: 8-22 mins $Therapeutic Exercise: 8-22 mins                    G Codes:      Robert [name] 01-12-15, 12:03 PM Robert [name], SPT

## 2015-01-07 NOTE — Discharge Instructions (Addendum)
Dr. Gaynelle Arabian Total Joint Specialist [facility] [address] [place], [place] [address] [phone]  TOTAL KNEE REPLACEMENT POSTOPERATIVE DIRECTIONS  Knee Rehabilitation, Guidelines Following Surgery  Results after knee surgery are often greatly improved when you follow the exercise, range of motion and muscle strengthening exercises prescribed by your doctor. Safety measures are also important to protect the knee from further injury. Any time any of these exercises cause you to have increased pain or swelling in your knee joint, decrease the amount until you are comfortable again and slowly increase them. If you have problems or questions, call your caregiver or physical therapist for advice.   HOME CARE INSTRUCTIONS  Remove items at home which could result in a fall. This includes throw rugs or furniture in walking pathways.   ICE to the affected knee every three hours for 30 minutes at a time and then as needed for pain and swelling.  Continue to use ice on the knee for pain and swelling from surgery. You may notice swelling that will progress down to the foot and ankle.  This is normal after surgery.  Elevate the leg when you are not up walking on it.    Continue to use the breathing machine which will help keep your temperature down.  It is common for your temperature to cycle up and down following surgery, especially at night when you are not up moving around and exerting yourself.  The breathing machine keeps your lungs expanded and your temperature down.  Do not place pillow under knee, focus on keeping the knee straight while resting  DIET You may resume your previous home diet once your are discharged from the hospital.  DRESSING / WOUND CARE / SHOWERING You may change your dressing 3-5 days after surgery.  Then change the dressing every day with sterile gauze.  Please use good hand washing techniques before changing the dressing.  Do not use any  lotions or creams on the incision until instructed by your surgeon. You may start showering once you are discharged home but do not submerge the incision under water. Just pat the incision dry and apply a dry gauze dressing on daily. Change the surgical dressing daily and reapply a dry dressing each time.  ACTIVITY Walk with your walker as instructed. Use walker as long as suggested by your caregivers. Avoid periods of inactivity such as sitting longer than an hour when not asleep. This helps prevent blood clots.  You may resume a sexual relationship in one month or when given the OK by your doctor.  You may return to work once you are cleared by your doctor.  Do not drive a car for 6 weeks or until released by you surgeon.  Do not drive while taking narcotics.  WEIGHT BEARING Weight bearing as tolerated with assist device (walker, cane, etc) as directed, use it as long as suggested by your surgeon or therapist, typically at least 4-6 weeks.  POSTOPERATIVE CONSTIPATION PROTOCOL Constipation - defined medically as fewer than three stools per week and severe constipation as less than one stool per week.  One of the most common issues patients have following surgery is constipation.  Even if you have a regular bowel pattern at home, your normal regimen is likely to be disrupted due to multiple reasons following surgery.  Combination of anesthesia, postoperative narcotics, change in appetite and fluid intake all can affect your bowels.  In order to avoid complications following surgery, here are some recommendations  in order to help you during your recovery period.  Colace (docusate) - Pick up an over-the-counter form of Colace or another stool softener and take twice a day as long as you are requiring postoperative pain medications.  Take with a full glass of water daily.  If you experience loose stools or diarrhea, hold the colace until you stool forms back up.  If your symptoms do not get better  within 1 week or if they get worse, check with your doctor.  Dulcolax (bisacodyl) - Pick up over-the-counter and take as directed by the product packaging as needed to assist with the movement of your bowels.  Take with a full glass of water.  Use this product as needed if not relieved by Colace only.   MiraLax (polyethylene glycol) - Pick up over-the-counter to have on hand.  MiraLax is a solution that will increase the amount of water in your bowels to assist with bowel movements.  Take as directed and can mix with a glass of water, juice, soda, coffee, or tea.  Take if you go more than two days without a movement. Do not use MiraLax more than once per day. Call your doctor if you are still constipated or irregular after using this medication for 7 days in a row.  If you continue to have problems with postoperative constipation, please contact the office for further assistance and recommendations.  If you experience "the worst abdominal pain ever" or develop nausea or vomiting, please contact the office immediatly for further recommendations for treatment.  ITCHING  If you experience itching with your medications, try taking only a single pain pill, or even half a pain pill at a time.  You can also use Benadryl over the counter for itching or also to help with sleep.   TED HOSE STOCKINGS Wear the elastic stockings on both legs for three weeks following surgery during the day but you may remove then at night for sleeping.  MEDICATIONS See your medication summary on the After Visit Summary that the nursing staff will review with you prior to discharge.  You may have some home medications which will be placed on hold until you complete the course of blood thinner medication.  It is important for you to complete the blood thinner medication as prescribed by your surgeon.  Continue your approved medications as instructed at time of discharge.  PRECAUTIONS If you experience chest pain or shortness  of breath - call 911 immediately for transfer to the hospital emergency department.  If you develop a fever greater that 101 F, purulent drainage from wound, increased redness or drainage from wound, foul odor from the wound/dressing, or calf pain - CONTACT YOUR SURGEON.                                                   FOLLOW-UP APPOINTMENTS Make sure you keep all of your appointments after your operation with your surgeon and caregivers. You should call the office at the above phone number and make an appointment for approximately two weeks after the date of your surgery or on the date instructed by your surgeon outlined in the "After Visit Summary".   RANGE OF MOTION AND STRENGTHENING EXERCISES  Rehabilitation of the knee is important following a knee injury or an operation. After just a few days of immobilization, the muscles  of the thigh which control the knee become weakened and shrink (atrophy). Knee exercises are designed to build up the tone and strength of the thigh muscles and to improve knee motion. Often times heat used for twenty to thirty minutes before working out will loosen up your tissues and help with improving the range of motion but do not use heat for the first two weeks following surgery. These exercises can be done on a training (exercise) mat, on the floor, on a table or on a bed. Use what ever works the best and is most comfortable for you Knee exercises include:  Leg Lifts - While your knee is still immobilized in a splint or cast, you can do straight leg raises. Lift the leg to 60 degrees, hold for 3 sec, and slowly lower the leg. Repeat 10-20 times 2-3 times daily. Perform this exercise against resistance later as your knee gets better.  Quad and Hamstring Sets - Tighten up the muscle on the front of the thigh (Quad) and hold for 5-10 sec. Repeat this 10-20 times hourly. Hamstring sets are done by pushing the foot backward against an object and holding for 5-10 sec. Repeat as  with quad sets.   Leg Slides: Lying on your back, slowly slide your foot toward your buttocks, bending your knee up off the floor (only go as far as is comfortable). Then slowly slide your foot back down until your leg is flat on the floor again.  Angel Wings: Lying on your back spread your legs to the side as far apart as you can without causing discomfort.  A rehabilitation program following serious knee injuries can speed recovery and prevent re-injury in the future due to weakened muscles. Contact your doctor or a physical therapist for more information on knee rehabilitation.   IF YOU ARE TRANSFERRED TO A SKILLED REHAB FACILITY If the patient is transferred to a skilled rehab facility following release from the hospital, a list of the current medications will be sent to the facility for the patient to continue.  When discharged from the skilled rehab facility, please have the facility set up the patient's [place] prior to being released. Also, the skilled facility will be responsible for providing the patient with their medications at time of release from the facility to include their pain medication, the muscle relaxants, and their blood thinner medication. If the patient is still at the rehab facility at time of the two week follow up appointment, the skilled rehab facility will also need to assist the patient in arranging follow up appointment in our office and any transportation needs.  MAKE SURE YOU:  Understand these instructions.  Get help right away if you are not doing well or get worse.    Pick up stool softner and laxative for home use following surgery while on pain medications. Do not submerge incision under water. Please use good hand washing techniques while changing dressing each day. May shower starting three days after surgery starting Thursday 01/09/2015. Please use a clean towel to pat the incision dry following showers. Continue to use ice for pain  and swelling after surgery. Do not use any lotions or creams on the incision until instructed by your surgeon.  Take Xarelto for two and a half more weeks, then discontinue Xarelto. Once the patient has completed the Xarelto, they may resume the 81 mg Aspirin.   Information on my medicine - XARELTO (Rivaroxaban)  This medication education was reviewed with me or  my healthcare representative as part of my discharge preparation.  The pharmacist that spoke with me during my hospital stay was:  Angela Adam [facility]  Why was Xarelto prescribed for you? Xarelto was prescribed for you to reduce the risk of blood clots forming after orthopedic surgery. The medical term for these abnormal blood clots is venous thromboembolism (VTE).  What do you need to know about xarelto ? Take your Xarelto ONCE DAILY at the same time every day. You may take it either with or without food.  If you have difficulty swallowing the tablet whole, you may crush it and mix in applesauce just prior to taking your dose.  Take Xarelto exactly as prescribed by your doctor and DO NOT stop taking Xarelto without talking to the doctor who prescribed the medication.  Stopping without other VTE prevention medication to take the place of Xarelto may increase your risk of developing a clot.  After discharge, you should have regular check-up appointments with your healthcare provider that is prescribing your Xarelto.    What do you do if you miss a dose? If you miss a dose, take it as soon as you remember on the same day then continue your regularly scheduled once daily regimen the next day. Do not take two doses of Xarelto on the same day.   Important Safety Information A possible side effect of Xarelto is bleeding. You should call your healthcare provider right away if you experience any of the following: ? Bleeding from an injury or your nose that does not stop. ? Unusual colored urine (red or dark brown) or  unusual colored stools (red or black). ? Unusual bruising for unknown reasons. ? A serious fall or if you hit your head (even if there is no bleeding).  Some medicines may interact with Xarelto and might increase your risk of bleeding while on Xarelto. To help avoid this, consult your healthcare provider or pharmacist prior to using any new prescription or non-prescription medications, including herbals, vitamins, non-steroidal anti-inflammatory drugs (NSAIDs) and supplements.  This website has more information on Xarelto: [email or web address]

## 2015-01-07 NOTE — Care Management Note (Unsigned)
    Page 1 of 1   01/07/2015     3:06:10 PM CARE MANAGEMENT NOTE 01/07/2015  Patient:  Robert [name], Robert [name]   Account Number:  [id number]  Date Initiated:  01/07/2015  Documentation initiated by:  [facility]  Subjective/Objective Assessment:   adm: total knee arthroplasty     Action/Plan:   discharge planning   Anticipated DC Date:  01/08/2015   Anticipated DC Plan:  [place]  CM consult      [facility] Choice  HOME HEALTH   Choice offered to / List presented to:  C-1 Patient        [place] arranged  HH-2 PT      [place]   Status of service:  Completed, signed off Medicare Important Message given?   (If response is "NO", the following Medicare IM given date fields will be blank) Date Medicare IM given:   Medicare IM given by:   Date Additional Medicare IM given:   Additional Medicare IM given by:    Discharge Disposition:  [place]  Per UR Regulation:    If discussed at Long Length of Stay Meetings, dates discussed:    Comments:  01/07/15 15:00 CM met with pt in room to offer choice of home health agency.  Pt chooses [facility] to render HHPT.  Pt is unsure if he has rolling walker and 3n1 and will let RN know on discharge day (01/08/15).  Address and contact information verifed by pt Home Number is [phone]. Referral emailed to [employer], Tim.  Will continue to follow for DME needs.  Mariane Masters, BSN, CM [phone].

## 2015-01-07 NOTE — Consult Note (Signed)
Urology Consult   Physician requesting consult: Alusio  Reason for consult: Urethral/penile pain   History of Present Illness: Robert [name] is a 69 y.o. with PMH significant for HTN, neuropathy, BPH s/p TURP, nephrolithiasis, and colon cancer who is POD 1 s/p total knee arthroplasty by Dr. Maureen Ralphs.  This morning he complained of penile/urethral pain which had been present since last Thurs.  He had a foley placed during surgery which was removed this morning.  He has been voiding well since foley removal.  Pre op UA was without infection and hematuria.  UA this morning prior to foley removal showed many RBCs and rare bacteria but was nitrite negative.   Pt states he began to have a low grade discomfort at the tip of the penis last Thurs.  This continued through Sunday and did not change in severity or character. He would have intermittent episodes of a stinging sensation at the tip of the penis that would last for 10-15 seconds and then resolve.   He also had some mild, intermittent dysuria.  He denies F/C, N/V, flank pain, back pain, abdominal pain, and hematuria during this time period.  His urologic hx is significant for bilateral renal cysts, BPH, gross hematuria, and nocturia.  His LUTs have responded well to daily Cialis.  His last renal U/S was in 2014 and his cysts were stable.  He has not had gross hematuria in many years and his last CT and cysto in 2013 were WNL.    He is currently resting comfortably and his only complaint is of some mild distal penile discomfort that just returned "after the pain meds wore off".  His first void after foley removal was painful but this has resolved.    Past Medical History  Diagnosis Date  . Hypertension   . Hyperlipidemia   . S/P cardiac catheterization 1995 and 1998    a. following abnormal stress tests. Both reportedly normal;  b. 02/2013 nl myoview.  . Anxiety   . Neuropathy     SECONDARY TO CHEMOTHERAPY  . Nocturia associated with benign  prostatic hypertrophy 03/21/2013  . Palpitations   . Colon cancer     a. s/p colon resection in 2004  . Complication of anesthesia     patient does not like spinals   . Kidney stones   . Arthritis   . Edema of right foot     ? muscle problem to be addressed after left knee TKA on 01/06/2015 per patient   . Sleep apnea with use of continuous positive airway pressure (CPAP)     cpap- 72 for temp 9 for res 3.5 humidity only wears 4 hours per patient    Past Surgical History  Procedure Laterality Date  . Cardiac catheterization  07/04/1997  . Colon resection  2004    x 2 per patient   . L4 discectomy    . Elbow surgery Left 2011    INFECTED OLECRANON BURSITIS SURGERY  . Transurethral resection of prostate    . Retinal detachment surgery  1998  . Septic joint    . Total knee arthroplasty Left 01/06/2015    Procedure: LEFT TOTAL KNEE ARTHROPLASTY;  Surgeon: Gaynelle Arabian, MD;  Location: [facility];  Service: Orthopedics;  Laterality: Left;    Current Hospital Medications:  Home Meds:    Medication List    ASK your doctor about these medications        aspirin 81 MG tablet  Take 81 mg by mouth daily.  CIALIS 5 MG tablet  Generic drug:  tadalafil  Take 5 mg by mouth daily.     diltiazem 180 MG 24 hr capsule  Commonly known as:  CARDIZEM CD  Take 1 capsule (180 mg total) by mouth daily.     l-methylfolate-B6-B12 3-35-2 MG Tabs  Commonly known as:  METANX  Take 1 tablet by mouth 2 (two) times daily. Vitamin B complex     lisinopril-hydrochlorothiazide 20-12.5 MG per tablet  Commonly known as:  PRINZIDE,ZESTORETIC  Take 1 tablet by mouth every morning.     LORazepam 0.5 MG tablet  Commonly known as:  ATIVAN  Take 0.5 mg by mouth daily as needed. When heart races     metoprolol succinate 25 MG 24 hr tablet  Commonly known as:  TOPROL-XL  Take 25 mg by mouth 2 (two) times daily.     multivitamin tablet  Take 1 tablet by mouth daily.     naproxen 500 MG tablet   Commonly known as:  NAPROSYN  Take 500 mg by mouth daily as needed for mild pain.     NON FORMULARY  CPAP MACHINE     NYQUIL COLD & FLU PO  Take 1 capsule by mouth at bedtime as needed (FOR SLEEP).     OMEGA 3 PO  Take 1 capsule by mouth daily.     rosuvastatin 10 MG tablet  Commonly known as:  CRESTOR  Take 1 tablet (10 mg total) by mouth every evening.        Scheduled Meds: . acetaminophen  1,000 mg Oral 4 times per day  . diltiazem  180 mg Oral QPM  . docusate sodium  100 mg Oral BID  . metoprolol succinate  25 mg Oral BID  . rivaroxaban  10 mg Oral Q breakfast  . rosuvastatin  10 mg Oral QPM   Continuous Infusions: . dextrose 5 % and 0.9% NaCl 100 mL/hr at 01/06/15 1941   PRN Meds:.acetaminophen **OR** acetaminophen, bisacodyl, diphenhydrAMINE, HYDROcodone-acetaminophen, lidocaine, LORazepam, menthol-cetylpyridinium **OR** phenol, methocarbamol **OR** methocarbamol (ROBAXIN)  IV, metoCLOPramide **OR** metoCLOPramide (REGLAN) injection, morphine injection, ondansetron **OR** ondansetron (ZOFRAN) IV, polyethylene glycol, traMADol  Allergies:  Allergies  Allergen Reactions  . Betadine [Povidone Iodine] Other (See Comments)    Causes burning to skin   . Other     Allergic to dye used when you give blood. Says its an orange dye- at [facility] of preop appointment on 12/31/2014- the orange dye patient is describing is betadine , betadine listed on allergies     Family History  Problem Relation Age of Onset  . Emphysema Mother   . Heart attack Father   . Hypertension Father   . Hypertension Other   . High Cholesterol Other     Social History:  reports that he has never smoked. He has never used smokeless tobacco. He reports that he drinks about 9.0 oz of alcohol per week. He reports that he does not use illicit drugs.  ROS: A complete review of systems was performed.  All systems are negative except for pertinent findings as noted.  Physical Exam:  Vital signs in last  24 hours: Temp:  [97.5 F (36.4 C)-98.6 F (37 C)] 98.6 F (37 C) (04/12 0930) Pulse Rate:  [54-72] 66 (04/12 0930) Resp:  [16-18] 16 (04/12 0930) BP: (99-122)/(46-57) 107/46 mmHg (04/12 0930) SpO2:  [96 %-100 %] 98 % (04/12 0930) Constitutional:  Alert and oriented, No acute distress Cardiovascular: Regular rate and rhythm Respiratory: Normal respiratory effort  GI: Abdomen is soft, nontender, nondistended, no abdominal masses GU: No CVA tenderness; circ penis without discharge or lesions Lymphatic: No lymphadenopathy Neurologic: Grossly intact, no focal deficits Psychiatric: Normal mood and affect  Laboratory Data:   Recent Labs  01/07/15 0415  WBC 12.5*  HGB 11.2*  HCT 34.2*  PLT 157     Recent Labs  01/07/15 0415  NA 134*  K 4.1  CL 103  GLUCOSE 169*  BUN 16  CALCIUM 9.1  CREATININE 1.03     Results for orders placed or performed during the hospital encounter of 01/06/15 (from the past 24 hour(s))  CBC     Status: Abnormal   Collection Time: 01/07/15  4:15 AM  Result Value Ref Range   WBC 12.5 (H) 4.0 - 10.5 K/uL   RBC 3.85 (L) 4.22 - 5.81 MIL/uL   Hemoglobin 11.2 (L) 13.0 - 17.0 g/dL   HCT 34.2 (L) 39.0 - 52.0 %   MCV 88.8 78.0 - 100.0 fL   MCH 29.1 26.0 - 34.0 pg   MCHC 32.7 30.0 - 36.0 g/dL   RDW 12.8 11.5 - 15.5 %   Platelets 157 150 - 400 K/uL  Basic metabolic panel     Status: Abnormal   Collection Time: 01/07/15  4:15 AM  Result Value Ref Range   Sodium 134 (L) 135 - 145 mmol/L   Potassium 4.1 3.5 - 5.1 mmol/L   Chloride 103 96 - 112 mmol/L   CO2 26 19 - 32 mmol/L   Glucose, Bld 169 (H) 70 - 99 mg/dL   BUN 16 6 - 23 mg/dL   Creatinine, Ser 1.03 0.50 - 1.35 mg/dL   Calcium 9.1 8.4 - 10.5 mg/dL   GFR calc non Af Amer 73 (L) >90 mL/min   GFR calc Af Amer 85 (L) >90 mL/min   Anion gap 5 5 - 15  Urinalysis, Routine w reflex microscopic     Status: Abnormal   Collection Time: 01/07/15  9:30 AM  Result Value Ref Range   Color, Urine YELLOW  YELLOW   APPearance CLOUDY (A) CLEAR   Specific Gravity, Urine 1.013 1.005 - 1.030   pH 5.5 5.0 - 8.0   Glucose, UA 100 (A) NEGATIVE mg/dL   Hgb urine dipstick LARGE (A) NEGATIVE   Bilirubin Urine NEGATIVE NEGATIVE   Ketones, ur NEGATIVE NEGATIVE mg/dL   Protein, ur NEGATIVE NEGATIVE mg/dL   Urobilinogen, UA 0.2 0.0 - 1.0 mg/dL   Nitrite NEGATIVE NEGATIVE   Leukocytes, UA NEGATIVE NEGATIVE  Urine microscopic-add on     Status: None   Collection Time: 01/07/15  9:30 AM  Result Value Ref Range   WBC, UA 3-6 <3 WBC/hpf   RBC / HPF TOO NUMEROUS TO COUNT <3 RBC/hpf   Bacteria, UA RARE RARE   Recent Results (from the past 240 hour(s))  Surgical pcr screen     Status: None   Collection Time: 12/31/14  8:40 AM  Result Value Ref Range Status   MRSA, PCR NEGATIVE NEGATIVE Final   Staphylococcus aureus NEGATIVE NEGATIVE Final    Comment:        The Xpert SA Assay (FDA approved for NASAL specimens in patients over 6 years of age), is one component of a comprehensive surveillance program.  Test performance has been validated by [facility] for patients greater than or equal to 27 year old. It is not intended to diagnose infection nor to guide or monitor treatment.     Renal Function:  Recent Labs  01/07/15 0415  CREATININE 1.03   Estimated Creatinine Clearance: 69.6 mL/min (by C-G formula based on Cr of 1.03).  Radiologic Imaging: No results found.   Impression/Recommendation Distal urethral discomfort--pt does not have UTI and does not appear to have passed any stones. Blood on UA this morning is due to foley placement.  Unclear cause for the discomfort.  Start pyridium and continue for 5-7 days for sx.  Pt will reassess discomfort after enough time has passed for foley irritation to resolve and stopping pyridium.  If still present, he can make an appt for an outpt eval with Dr. Junious Silk and may require cysto.    [place], [place] 01/07/2015, 1:37 PM

## 2015-01-07 NOTE — Progress Notes (Addendum)
Subjective: 1 Day Post-Op Procedure(s) (LRB): LEFT TOTAL KNEE ARTHROPLASTY (Left) Patient reports pain as mild.   Patient seen in rounds with Dr. Wynelle Link.  Had a tough night with pain control.  Patient did not take his meds and had increased pain. Did receive medications earlier this morning and has better control now. His other issue is penile discomfort / urethral pain.  Thought to be the foley catheter but the patient gives history of it starting as an outpatient several days prior to the admission and surgery.  He first noticed it on Thursday last week but did not address the issue at that time wanting to proceed with his scheduled knee surgery. Now having the pain still and the patient is requesting a call to Dr. Junious Silk, his urologist., to be placed to see if he will come by and address this issue while in the hospital. Patient is having problems with the penile / urethral discomfort and knee pain which is under better control now since starting back on the pain medications. We will start therapy today.  Plan is to go Home after hospital stay.  Objective: Vital signs in last 24 hours: Temp:  [94.6 F (34.8 C)-98.4 F (36.9 C)] 97.6 F (36.4 C) (04/12 0631) Pulse Rate:  [52-72] 55 (04/12 0631) Resp:  [10-18] 16 (04/12 0631) BP: (99-124)/(46-66) 99/50 mmHg (04/12 0631) SpO2:  [96 %-100 %] 97 % (04/12 0631)  Intake/Output from previous day:  Intake/Output Summary (Last 24 hours) at 01/07/15 0813 Last data filed at 01/07/15 7121  Gross per 24 hour  Intake   3605 ml  Output 3842.5 ml  Net -237.5 ml    Intake/Output this shift: UOP over 2000 since around [place]  Labs:  Recent Labs  01/07/15 0415  HGB 11.2*    Recent Labs  01/07/15 0415  WBC 12.5*  RBC 3.85*  HCT 34.2*  PLT 157    Recent Labs  01/07/15 0415  NA 134*  K 4.1  CL 103  CO2 26  BUN 16  CREATININE 1.03  GLUCOSE 169*  CALCIUM 9.1   No results for input(s): LABPT, INR in the last 72  hours.  EXAM General - Patient is Alert, Appropriate and Oriented Extremity - Neurovascular intact Sensation intact distally Dorsiflexion/Plantar flexion intact Dressing - dressing C/D/I Motor Function - intact, moving foot and toes well on exam.  Hemovac pulled without difficulty.  Past Medical History  Diagnosis Date  . Hypertension   . Hyperlipidemia   . S/P cardiac catheterization 1995 and 1998    a. following abnormal stress tests. Both reportedly normal;  b. 02/2013 nl myoview.  . Anxiety   . Neuropathy     SECONDARY TO CHEMOTHERAPY  . Nocturia associated with benign prostatic hypertrophy 03/21/2013  . Palpitations   . Colon cancer     a. s/p colon resection in 2004  . Complication of anesthesia     patient does not like spinals   . Kidney stones   . Arthritis   . Edema of right foot     ? muscle problem to be addressed after left knee TKA on 01/06/2015 per patient   . Sleep apnea with use of continuous positive airway pressure (CPAP)     cpap- 72 for temp 9 for res 3.5 humidity only wears 4 hours per patient    Assessment/Plan: 1 Day Post-Op Procedure(s) (LRB): LEFT TOTAL KNEE ARTHROPLASTY (Left) Principal Problem:   OA (osteoarthritis) of knee  Estimated body mass index is 24.8  kg/(m^2) as calculated from the following:   Height as of this encounter: 5\' 9"  (1.753 m).   Weight as of this encounter: 76.204 kg (168 lb). Advance diet Up with therapy Plan for discharge tomorrow Discharge home with home health   History of kidney stones - will check UA this morning from a cath specimen.  DVT Prophylaxis - Xarelto Weight-Bearing as tolerated to left leg D/C O2 and Pulse OX and try on Room Air  Arlee Muslim, PA-C Orthopaedic Surgery 01/07/2015, 8:13 AM   Addendum - cath specimen UA performed today. + Glucose Large Hgb RBC's too numerus to count Urology consult called.

## 2015-01-08 LAB — BASIC METABOLIC PANEL
ANION GAP: 6 (ref 5–15)
BUN: 21 mg/dL (ref 6–23)
CO2: 26 mmol/L (ref 19–32)
Calcium: 9.5 mg/dL (ref 8.4–10.5)
Chloride: 108 mmol/L (ref 96–112)
Creatinine, Ser: 0.77 mg/dL (ref 0.50–1.35)
GFR calc Af Amer: >90 mL/min (ref >90)
GFR calc non Af Amer: >90 mL/min (ref >90)
GLUCOSE: 125 mg/dL — AB (ref 70–99)
POTASSIUM: 4.5 mmol/L (ref 3.5–5.1)
Sodium: 140 mmol/L (ref 135–145)

## 2015-01-08 LAB — CBC
HEMATOCRIT: 33.1 % — AB (ref 39.0–52.0)
HEMOGLOBIN: 10.8 g/dL — AB (ref 13.0–17.0)
MCH: 29 pg (ref 26.0–34.0)
MCHC: 32.6 g/dL (ref 30.0–36.0)
MCV: 89 fL (ref 78.0–100.0)
Platelets: 155 10*3/uL (ref 150–400)
RBC: 3.72 MIL/uL — AB (ref 4.22–5.81)
RDW: 13.2 % (ref 11.5–15.5)
WBC: 13.6 10*3/uL — ABNORMAL HIGH (ref 4.0–10.5)

## 2015-01-08 MED ORDER — METHOCARBAMOL 500 MG PO TABS: 500.0000 mg | ORAL_TABLET | Freq: Four times a day (QID) | ORAL | Status: DC | PRN

## 2015-01-08 MED ORDER — HYDROCODONE-ACETAMINOPHEN 5-325 MG PO TABS: 1.0000 | ORAL_TABLET | ORAL | Status: DC | PRN

## 2015-01-08 MED ORDER — PHENAZOPYRIDINE HCL 100 MG PO TABS: 100.0000 mg | ORAL_TABLET | Freq: Three times a day (TID) | ORAL | Status: DC

## 2015-01-08 MED ORDER — RIVAROXABAN 10 MG PO TABS: 10.0000 mg | ORAL_TABLET | Freq: Every day | ORAL | Status: DC

## 2015-01-08 MED ORDER — TRAMADOL HCL 50 MG PO TABS: 50.0000 mg | ORAL_TABLET | Freq: Four times a day (QID) | ORAL | Status: DC | PRN

## 2015-01-08 NOTE — Progress Notes (Signed)
Physical Therapy Treatment Patient Details Name: Robert [name] MRN: [id number] DOB: [birth year] Today's Date: 01/08/2015    History of Present Illness Pt is 69 y/o male s/p L TKA    PT Comments    Pt tolerated treatment well and was able to complete bed mobility, transfers, and ambulation with supervision. Pt practiced stairs with min guard and completed exercises, handout provided for home exercises. Pt is progressing well with PT and plans to D/C home today and continue with home health.    Follow Up Recommendations  Home health PT;Supervision/Assistance - 24 hour     Equipment Recommendations  Rolling walker with 5" wheels;Crutches    Recommendations for Other Services       Precautions / Restrictions Precautions Precautions: Knee;Fall Precaution Comments: pt able to perform straight leg raise Restrictions Weight Bearing Restrictions: No Other Position/Activity Restrictions: WBAT    Mobility  Bed Mobility Overal bed mobility: Needs Assistance Bed Mobility: Supine to Sit     Supine to sit: Supervision     General bed mobility comments: supervision for safety  Transfers Overall transfer level: Needs assistance Equipment used: Rolling walker (2 wheeled) Transfers: Sit to/from Stand Sit to Stand: Supervision         General transfer comment: supervision for safety  Ambulation/Gait Ambulation/Gait assistance: Supervision Ambulation Distance (Feet): 260 Feet Assistive device: Rolling walker (2 wheeled) Gait Pattern/deviations: Step-to pattern;Antalgic     General Gait Details: supervision for safety   Stairs Stairs: Yes Stairs assistance: Min guard Stair Management: One rail Right;With crutches Number of Stairs: 4 General stair comments: pt reminded of safe stair technique and sequence. min guard for safety. pt practiced stairs once with rail on the right, and once with rail on the left  Wheelchair Mobility    Modified Rankin (Stroke Patients  Only)       Balance                                    Cognition Arousal/Alertness: Awake/alert Behavior During Therapy: WFL for tasks assessed/performed Overall Cognitive Status: Within Functional Limits for tasks assessed                      Exercises Total Joint Exercises Ankle Circles/Pumps: AROM;Both;15 reps Quad Sets: AROM;Left;10 reps Short Arc Quad: 10 reps;Left;AAROM Heel Slides: AAROM;10 reps;Left Hip ABduction/ADduction: AAROM;10 reps;Left Straight Leg Raises: AAROM;10 reps;Left    General Comments        Pertinent Vitals/Pain Pain Assessment: 0-10 Pain Score: 2  Pain Location: L knee Pain Descriptors / Indicators: Sore Pain Intervention(s): Limited activity within patient's tolerance;Monitored during session;Ice applied    Home Living                      Prior Function            PT Goals (current goals can now be found in the care plan section) Acute Rehab PT Goals Patient Stated Goal: to go home PT Goal Formulation: With patient/family Time For Goal Achievement: 01/13/15 Potential to Achieve Goals: Good Progress towards PT goals: Progressing toward goals    Frequency  7X/week    PT Plan Current plan remains appropriate    Co-evaluation             End of Session Equipment Utilized During Treatment: Gait belt Activity Tolerance: Patient tolerated treatment well Patient left: with call bell/phone within reach;in  chair     Time: 6387-5643 PT Time Calculation (min) (ACUTE ONLY): 24 min  Charges:  $Gait Training: 8-22 mins $Therapeutic Exercise: 8-22 mins                    G Codes:      Taquana Bartley 01-23-15, 1:59 PM  Charlott Holler, SPT

## 2015-01-08 NOTE — Progress Notes (Signed)
   Subjective: 2 Days Post-Op Procedure(s) (LRB): LEFT TOTAL KNEE ARTHROPLASTY (Left) Patient reports pain as mild.   Patient seen in rounds by Dr. Wynelle Link. Patient is well, but has had some minor complaints of pain in the knee, requiring pain medications Patient is ready to go home today following therapy.  Objective: Vital signs in last 24 hours: Temp:  [97.9 F (36.6 C)-98.7 F (37.1 C)] 98.1 F (36.7 C) (04/13 0415) Pulse Rate:  [58-73] 58 (04/13 0415) Resp:  [16] 16 (04/13 0415) BP: (100-138)/(46-60) 120/51 mmHg (04/13 0415) SpO2:  [96 %-99 %] 96 % (04/13 0415)  Intake/Output from previous day:  Intake/Output Summary (Last 24 hours) at 01/08/15 0656 Last data filed at 01/08/15 0630  Gross per 24 hour  Intake   1359 ml  Output   2500 ml  Net  -1141 ml    Intake/Output this shift: Total I/O In: -  Out: 1650 [Urine:1650]  Labs:  Recent Labs  01/07/15 0415 01/08/15 0435  HGB 11.2* 10.8*    Recent Labs  01/07/15 0415 01/08/15 0435  WBC 12.5* 13.6*  RBC 3.85* 3.72*  HCT 34.2* 33.1*  PLT 157 155    Recent Labs  01/07/15 0415 01/08/15 0435  NA 134* 140  K 4.1 4.5  CL 103 108  CO2 26 26  BUN 16 21  CREATININE 1.03 0.77  GLUCOSE 169* 125*  CALCIUM 9.1 9.5   No results for input(s): LABPT, INR in the last 72 hours.  EXAM: General - Patient is Alert, Appropriate and Oriented Extremity - Neurovascular intact Sensation intact distally Dorsiflexion/Plantar flexion intact Incision - clean, dry, no drainage Motor Function - intact, moving foot and toes well on exam.   Assessment/Plan: 2 Days Post-Op Procedure(s) (LRB): LEFT TOTAL KNEE ARTHROPLASTY (Left) Procedure(s) (LRB): LEFT TOTAL KNEE ARTHROPLASTY (Left) Past Medical History  Diagnosis Date  . Hypertension   . Hyperlipidemia   . S/P cardiac catheterization 1995 and 1998    a. following abnormal stress tests. Both reportedly normal;  b. 02/2013 nl myoview.  . Anxiety   . Neuropathy    SECONDARY TO CHEMOTHERAPY  . Nocturia associated with benign prostatic hypertrophy 03/21/2013  . Palpitations   . Colon cancer     a. s/p colon resection in 2004  . Complication of anesthesia     patient does not like spinals   . Kidney stones   . Arthritis   . Edema of right foot     ? muscle problem to be addressed after left knee TKA on 01/06/2015 per patient   . Sleep apnea with use of continuous positive airway pressure (CPAP)     cpap- 72 for temp 9 for res 3.5 humidity only wears 4 hours per patient   Principal Problem:   OA (osteoarthritis) of knee  Estimated body mass index is 24.8 kg/(m^2) as calculated from the following:   Height as of this encounter: 5\' 9"  (1.753 m).   Weight as of this encounter: 76.204 kg (168 lb). Up with therapy Discharge home with home health Diet - Cardiac diet Follow up - in 2 weeks Activity - WBAT Disposition - Home Condition Upon Discharge - Good D/C Meds - See DC Summary DVT Prophylaxis - Xarelto  Arlee Muslim, PA-C Orthopaedic Surgery 01/08/2015, 6:56 AM

## 2015-01-08 NOTE — Progress Notes (Signed)
Pt educated with discharge instructions. All medications were thoroughly discussed in detail. Wife and pt in agreeable on medications to be taken. IV discontinued and dressing placed.   NT to take pt downstairs. Pt in NAD. Pt alert and oriented and in understanding of all discharge instructions.

## 2015-01-08 NOTE — Discharge Summary (Signed)
Physician Discharge Summary   Patient ID: Robert [name] MRN: [id number] DOB/AGE: [birth year] 69 y.o.  Admit date: 01/06/2015 Discharge date: 01-08-2015  Primary Diagnosis:  Osteoarthritis Left knee(s)  Admission Diagnoses:  Past Medical History  Diagnosis Date  . Hypertension   . Hyperlipidemia   . S/P cardiac catheterization 1995 and 1998    a. following abnormal stress tests. Both reportedly normal;  b. 02/2013 nl myoview.  . Anxiety   . Neuropathy     SECONDARY TO CHEMOTHERAPY  . Nocturia associated with benign prostatic hypertrophy 03/21/2013  . Palpitations   . Colon cancer     a. s/p colon resection in 2004  . Complication of anesthesia     patient does not like spinals   . Kidney stones   . Arthritis   . Edema of right foot     ? muscle problem to be addressed after left knee TKA on 01/06/2015 per patient   . Sleep apnea with use of continuous positive airway pressure (CPAP)     cpap- 72 for temp 9 for res 3.5 humidity only wears 4 hours per patient   Discharge Diagnoses:   Principal Problem:   OA (osteoarthritis) of knee  Estimated body mass index is 24.8 kg/(m^2) as calculated from the following:   Height as of this encounter: _0  (1.753 m).   Weight as of this encounter: 76.204 kg (168 lb).  Procedure:  Procedure(s) (LRB): LEFT TOTAL KNEE ARTHROPLASTY (Left)   Consults: urology  HPI: Robert [name] is a 69 y.o. year old male with end stage OA of his left knee with progressively worsening pain and dysfunction. He has constant pain, with activity and at rest and significant functional deficits with difficulties even with ADLs. He has had extensive non-op management including analgesics, injections of cortisone, and home exercise program, but remains in significant pain with significant dysfunction. Radiographs show bone on bone arthritis medial and patellofemoral. He presents now for left Total Knee Arthroplasty.  Laboratory Data: Admission on 01/06/2015    Component Date Value Ref Range Status  . ABO/RH(D) 01/06/2015 O POS   Final  . Antibody Screen 01/06/2015 NEG   Final  . Sample Expiration 01/06/2015 01/09/2015   Final  . WBC 01/07/2015 12.5* 4.0 - 10.5 K/uL Final  . RBC 01/07/2015 3.85* 4.22 - 5.81 MIL/uL Final  . Hemoglobin 01/07/2015 11.2* 13.0 - 17.0 g/dL Final  . HCT 01/07/2015 34.2* 39.0 - 52.0 % Final  . MCV 01/07/2015 88.8  78.0 - 100.0 fL Final  . MCH 01/07/2015 29.1  26.0 - 34.0 pg Final  . MCHC 01/07/2015 32.7  30.0 - 36.0 g/dL Final  . RDW 01/07/2015 12.8  11.5 - 15.5 % Final  . Platelets 01/07/2015 157  150 - 400 K/uL Final  . Sodium 01/07/2015 134* 135 - 145 mmol/L Final  . Potassium 01/07/2015 4.1  3.5 - 5.1 mmol/L Final  . Chloride 01/07/2015 103  96 - 112 mmol/L Final  . CO2 01/07/2015 26  19 - 32 mmol/L Final  . Glucose, Bld 01/07/2015 169* 70 - 99 mg/dL Final  . BUN 01/07/2015 16  6 - 23 mg/dL Final  . Creatinine, Ser 01/07/2015 1.03  0.50 - 1.35 mg/dL Final  . Calcium 01/07/2015 9.1  8.4 - 10.5 mg/dL Final  . GFR calc non Af Amer 01/07/2015 73* >90 mL/min Final  . GFR calc Af Amer 01/07/2015 85* >90 mL/min Final   Comment: (NOTE) The eGFR has been calculated using the CKD  EPI equation. This calculation has not been validated in all clinical situations. eGFR's persistently <90 mL/min signify possible Chronic Kidney Disease.   . Anion gap 01/07/2015 5  5 - 15 Final  . Color, Urine 01/07/2015 YELLOW  YELLOW Final  . APPearance 01/07/2015 CLOUDY* CLEAR Final  . Specific Gravity, Urine 01/07/2015 1.013  1.005 - 1.030 Final  . pH 01/07/2015 5.5  5.0 - 8.0 Final  . Glucose, UA 01/07/2015 100* NEGATIVE mg/dL Final  . Hgb urine dipstick 01/07/2015 LARGE* NEGATIVE Final  . Bilirubin Urine 01/07/2015 NEGATIVE  NEGATIVE Final  . Ketones, ur 01/07/2015 NEGATIVE  NEGATIVE mg/dL Final  . Protein, ur 01/07/2015 NEGATIVE  NEGATIVE mg/dL Final  . Urobilinogen, UA 01/07/2015 0.2  0.0 - 1.0 mg/dL Final  . Nitrite 01/07/2015  NEGATIVE  NEGATIVE Final  . Leukocytes, UA 01/07/2015 NEGATIVE  NEGATIVE Final  . WBC, UA 01/07/2015 3-6  <3 WBC/hpf Final  . RBC / HPF 01/07/2015 TOO NUMEROUS TO COUNT  <3 RBC/hpf Final  . Bacteria, UA 01/07/2015 RARE  RARE Final  . WBC 01/08/2015 13.6* 4.0 - 10.5 K/uL Final  . RBC 01/08/2015 3.72* 4.22 - 5.81 MIL/uL Final  . Hemoglobin 01/08/2015 10.8* 13.0 - 17.0 g/dL Final  . HCT 01/08/2015 33.1* 39.0 - 52.0 % Final  . MCV 01/08/2015 89.0  78.0 - 100.0 fL Final  . MCH 01/08/2015 29.0  26.0 - 34.0 pg Final  . MCHC 01/08/2015 32.6  30.0 - 36.0 g/dL Final  . RDW 01/08/2015 13.2  11.5 - 15.5 % Final  . Platelets 01/08/2015 155  150 - 400 K/uL Final  . Sodium 01/08/2015 140  135 - 145 mmol/L Final  . Potassium 01/08/2015 4.5  3.5 - 5.1 mmol/L Final  . Chloride 01/08/2015 108  96 - 112 mmol/L Final  . CO2 01/08/2015 26  19 - 32 mmol/L Final  . Glucose, Bld 01/08/2015 125* 70 - 99 mg/dL Final  . BUN 01/08/2015 21  6 - 23 mg/dL Final  . Creatinine, Ser 01/08/2015 0.77  0.50 - 1.35 mg/dL Final  . Calcium 01/08/2015 9.5  8.4 - 10.5 mg/dL Final  . GFR calc non Af Amer 01/08/2015 >90  >90 mL/min Final  . GFR calc Af Amer 01/08/2015 >90  >90 mL/min Final   Comment: (NOTE) The eGFR has been calculated using the CKD EPI equation. This calculation has not been validated in all clinical situations. eGFR's persistently <90 mL/min signify possible Chronic Kidney Disease.   Georgiann Hahn gap 01/08/2015 6  5 - 15 Final  Hospital Outpatient Visit on 12/31/2014  Component Date Value Ref Range Status  . aPTT 12/31/2014 32  24 - 37 seconds Final  . WBC 12/31/2014 6.0  4.0 - 10.5 K/uL Final  . RBC 12/31/2014 5.11  4.22 - 5.81 MIL/uL Final  . Hemoglobin 12/31/2014 14.9  13.0 - 17.0 g/dL Final  . HCT 12/31/2014 45.5  39.0 - 52.0 % Final  . MCV 12/31/2014 89.0  78.0 - 100.0 fL Final  . MCH 12/31/2014 29.2  26.0 - 34.0 pg Final  . MCHC 12/31/2014 32.7  30.0 - 36.0 g/dL Final  . RDW 12/31/2014 13.1  11.5 -  15.5 % Final  . Platelets 12/31/2014 187  150 - 400 K/uL Final  . Sodium 12/31/2014 138  135 - 145 mmol/L Final  . Potassium 12/31/2014 4.1  3.5 - 5.1 mmol/L Final  . Chloride 12/31/2014 104  96 - 112 mmol/L Final  . CO2 12/31/2014 26  19 -  32 mmol/L Final  . Glucose, Bld 12/31/2014 87  70 - 99 mg/dL Final  . BUN 12/31/2014 28* 6 - 23 mg/dL Final  . Creatinine, Ser 12/31/2014 0.98  0.50 - 1.35 mg/dL Final  . Calcium 12/31/2014 10.4  8.4 - 10.5 mg/dL Final  . Total Protein 12/31/2014 7.0  6.0 - 8.3 g/dL Final  . Albumin 12/31/2014 4.4  3.5 - 5.2 g/dL Final  . AST 12/31/2014 28  0 - 37 U/L Final  . ALT 12/31/2014 27  0 - 53 U/L Final  . Alkaline Phosphatase 12/31/2014 72  39 - 117 U/L Final  . Total Bilirubin 12/31/2014 0.6  0.3 - 1.2 mg/dL Final  . GFR calc non Af Amer 12/31/2014 83* >90 mL/min Final  . GFR calc Af Amer 12/31/2014 >90  >90 mL/min Final   Comment: (NOTE) The eGFR has been calculated using the CKD EPI equation. This calculation has not been validated in all clinical situations. eGFR's persistently <90 mL/min signify possible Chronic Kidney Disease.   . Anion gap 12/31/2014 8  5 - 15 Final  . Prothrombin Time 12/31/2014 13.0  11.6 - 15.2 seconds Final  . INR 12/31/2014 0.97  0.00 - 1.49 Final  . Color, Urine 12/31/2014 YELLOW  YELLOW Final  . APPearance 12/31/2014 CLEAR  CLEAR Final  . Specific Gravity, Urine 12/31/2014 1.018  1.005 - 1.030 Final  . pH 12/31/2014 6.5  5.0 - 8.0 Final  . Glucose, UA 12/31/2014 NEGATIVE  NEGATIVE mg/dL Final  . Hgb urine dipstick 12/31/2014 NEGATIVE  NEGATIVE Final  . Bilirubin Urine 12/31/2014 NEGATIVE  NEGATIVE Final  . Ketones, ur 12/31/2014 NEGATIVE  NEGATIVE mg/dL Final  . Protein, ur 12/31/2014 NEGATIVE  NEGATIVE mg/dL Final  . Urobilinogen, UA 12/31/2014 0.2  0.0 - 1.0 mg/dL Final  . Nitrite 12/31/2014 NEGATIVE  NEGATIVE Final  . Leukocytes, UA 12/31/2014 NEGATIVE  NEGATIVE Final   MICROSCOPIC NOT DONE ON URINES WITH NEGATIVE  PROTEIN, BLOOD, LEUKOCYTES, NITRITE, OR GLUCOSE <1000 mg/dL.  Marland Kitchen MRSA, PCR 12/31/2014 NEGATIVE  NEGATIVE Final  . Staphylococcus aureus 12/31/2014 NEGATIVE  NEGATIVE Final   Comment:        The Xpert SA Assay (FDA approved for NASAL specimens in patients over 64 years of age), is one component of a comprehensive surveillance program.  Test performance has been validated by [facility] for patients greater than or equal to 31 year old. It is not intended to diagnose infection nor to guide or monitor treatment.      X-Rays:No results found.  EKG: Orders placed or performed during the hospital encounter of 07/25/14  . EKG 12-Lead  . EKG 12-Lead  . EKG  . EKG  . EKG     Hospital Course: Robert [name] is a 69 y.o. who was admitted to [facility]. They were brought to the operating room on 01/06/2015 and underwent Procedure(s): LEFT TOTAL KNEE ARTHROPLASTY.  Patient tolerated the procedure well and was later transferred to the recovery room and then to the orthopaedic floor for postoperative care.  They were given PO and IV analgesics for pain control following their surgery.  They were given 24 hours of postoperative antibiotics of  Anti-infectives    Start     Dose/Rate Route Frequency Ordered Stop   01/06/15 1400  ceFAZolin (ANCEF) IVPB 2 g/50 mL premix     2 g 100 mL/hr over 30 Minutes Intravenous Every 6 hours 01/06/15 1004 01/06/15 2019   01/06/15 0543  ceFAZolin (ANCEF)  IVPB 2 g/50 mL premix     2 g 100 mL/hr over 30 Minutes Intravenous On call to O.R. 01/06/15 0543 01/06/15 0730     and started on DVT prophylaxis in the form of Xarelto.   PT and OT were ordered for total joint protocol.  Discharge planning consulted to help with postop disposition and equipment needs.  Patient had a tough night on the evening of surgery.  Patient seen in rounds with Dr. Wynelle Link on day one. Had a tough night with pain control. Patient did not take his meds and had increased pain.  Did receive medications earlier that morning and had better control now. His other issue was penile discomfort / urethral pain. Thought to be the foley catheter but the patient gave history of it starting as an outpatient several days prior to the admission and surgery. He first noticed it on Thursday the week before but did not address the issue at that time wanting to proceed with his scheduled knee surgery.  The patient requested a call to Dr. Junious Silk, his urologist., to be placed to see if he will come by and address this issue while in the hospital. They started to get up OOB with therapy on day one. Hemovac drain was pulled without difficulty.   Impression/Recommendation Distal urethral discomfort--pt does not have UTI and does not appear to have passed any stones. Blood on UA this morning is due to foley placement. Unclear cause for the discomfort. Start pyridium and continue for 5-7 days for sx. Pt will reassess discomfort after enough time has passed for foley irritation to resolve and stopping pyridium. If still present, he can make an appt for an outpt eval with Dr. Junious Silk and may require cysto.  DANCY PA-C, AMANDA Continued to work with therapy into day two.  Dressing was changed on day two and the incision was healing well. Patient was seen in rounds on day two by Dr. Wynelle Link and was ready to go home following therapy.  Discharge home with home health Diet - Cardiac diet Follow up - in 2 weeks with Dr. Wynelle Link He will followup with his Urologist if his symptoms persist. Activity - WBAT Disposition - Home Condition Upon Discharge - Good D/C Meds - See DC Summary DVT Prophylaxis - Xarelto      Discharge Instructions    Call MD / Call 911    Complete by:  As directed   If you experience chest pain or shortness of breath, CALL 911 and be transported to the hospital emergency room.  If you develope a fever above 101 F, pus (white drainage) or increased drainage or redness at the  wound, or calf pain, call your surgeon's office.     Change dressing    Complete by:  As directed   Change dressing daily with sterile 4 x 4 inch gauze dressing and apply TED hose. Do not submerge the incision under water.     Constipation Prevention    Complete by:  As directed   Drink plenty of fluids.  Prune juice may be helpful.  You may use a stool softener, such as Colace (over the counter) 100 mg twice a day.  Use MiraLax (over the counter) for constipation as needed.     Diet - low sodium heart healthy    Complete by:  As directed      Discharge instructions    Complete by:  As directed   Pick up stool softner and laxative for home use  following surgery while on pain medications. Do not submerge incision under water. Please use good hand washing techniques while changing dressing each day. May shower starting three days after surgery on Thursday 01/09/2015. Please use a clean towel to pat the incision dry following showers. Continue to use ice for pain and swelling after surgery. Do not use any lotions or creams on the incision until instructed by your surgeon.  Take Xarelto for two and a half more weeks, then discontinue Xarelto. Once the patient has completed the Xarelto, they may resume the 81 mg Aspirin.  Postoperative Constipation Protocol  Constipation - defined medically as fewer than three stools per week and severe constipation as less than one stool per week.  One of the most common issues patients have following surgery is constipation.  Even if you have a regular bowel pattern at home, your normal regimen is likely to be disrupted due to multiple reasons following surgery.  Combination of anesthesia, postoperative narcotics, change in appetite and fluid intake all can affect your bowels.  In order to avoid complications following surgery, here are some recommendations in order to help you during your recovery period.  Colace (docusate) - Pick up an over-the-counter form  of Colace or another stool softener and take twice a day as long as you are requiring postoperative pain medications.  Take with a full glass of water daily.  If you experience loose stools or diarrhea, hold the colace until you stool forms back up.  If your symptoms do not get better within 1 week or if they get worse, check with your doctor.  Dulcolax (bisacodyl) - Pick up over-the-counter and take as directed by the product packaging as needed to assist with the movement of your bowels.  Take with a full glass of water.  Use this product as needed if not relieved by Colace only.   MiraLax (polyethylene glycol) - Pick up over-the-counter to have on hand.  MiraLax is a solution that will increase the amount of water in your bowels to assist with bowel movements.  Take as directed and can mix with a glass of water, juice, soda, coffee, or tea.  Take if you go more than two days without a movement. Do not use MiraLax more than once per day. Call your doctor if you are still constipated or irregular after using this medication for 7 days in a row.  If you continue to have problems with postoperative constipation, please contact the office for further assistance and recommendations.  If you experience "the worst abdominal pain ever" or develop nausea or vomiting, please contact the office immediatly for further recommendations for treatment.     Do not put a pillow under the knee. Place it under the heel.    Complete by:  As directed      Do not sit on low chairs, stoools or toilet seats, as it may be difficult to get up from low surfaces    Complete by:  As directed      Driving restrictions    Complete by:  As directed   No driving until released by the physician.     Increase activity slowly as tolerated    Complete by:  As directed      Lifting restrictions    Complete by:  As directed   No lifting until released by the physician.     Patient may shower    Complete by:  As directed   You may  shower without  a dressing once there is no drainage.  Do not wash over the wound.  If drainage remains, do not shower until drainage stops.     TED hose    Complete by:  As directed   Use stockings (TED hose) for 3 weeks on both leg(s).  You may remove them at night for sleeping.     Weight bearing as tolerated    Complete by:  As directed   Laterality:  left  Extremity:  Lower            Medication List    STOP taking these medications        aspirin 81 MG tablet     l-methylfolate-B6-B12 3-35-2 MG Tabs  Commonly known as:  METANX     multivitamin tablet     naproxen 500 MG tablet  Commonly known as:  NAPROSYN     OMEGA 3 PO      TAKE these medications        CIALIS 5 MG tablet  Generic drug:  tadalafil  Take 5 mg by mouth daily.     diltiazem 180 MG 24 hr capsule  Commonly known as:  CARDIZEM CD  Take 1 capsule (180 mg total) by mouth daily.     HYDROcodone-acetaminophen 5-325 MG per tablet  Commonly known as:  NORCO/VICODIN  Take 1-2 tablets by mouth every 4 (four) hours as needed for moderate pain.     lisinopril-hydrochlorothiazide 20-12.5 MG per tablet  Commonly known as:  PRINZIDE,ZESTORETIC  Take 1 tablet by mouth every morning.     LORazepam 0.5 MG tablet  Commonly known as:  ATIVAN  Take 0.5 mg by mouth daily as needed. When heart races     methocarbamol 500 MG tablet  Commonly known as:  ROBAXIN  Take 1 tablet (500 mg total) by mouth every 6 (six) hours as needed for muscle spasms.     metoprolol succinate 25 MG 24 hr tablet  Commonly known as:  TOPROL-XL  Take 25 mg by mouth 2 (two) times daily.     NON FORMULARY  CPAP MACHINE     NYQUIL COLD & FLU PO  Take 1 capsule by mouth at bedtime as needed (FOR SLEEP).     phenazopyridine 100 MG tablet  Commonly known as:  PYRIDIUM  Take 1 tablet (100 mg total) by mouth every 8 (eight) hours.     rivaroxaban 10 MG Tabs tablet  Commonly known as:  XARELTO  - Take 1 tablet (10 mg total) by mouth  daily with breakfast. Take Xarelto for two and a half more weeks, then discontinue Xarelto.  - Once the patient has completed the Xarelto, they may resume the 81 mg Aspirin.     rosuvastatin 10 MG tablet  Commonly known as:  CRESTOR  Take 1 tablet (10 mg total) by mouth every evening.     traMADol 50 MG tablet  Commonly known as:  ULTRAM  Take 1-2 tablets (50-100 mg total) by mouth every 6 (six) hours as needed (mild pain).       Follow-up Information    Follow up with [facility].   Why:  home health physical therapy   Contact information:   [place]  [address] [phone]       Follow up with [name], MD. Schedule an appointment as soon as possible for a visit on 01/21/2015.   Specialty:  Orthopedic Surgery   Why:  Call office at [phone]  to set up appointment with Dr. Wynelle Link on Tuesday 01/21/2015.   Contact information:   [address] [place] [address] [phone]       Signed: Arlee Muslim, PA-C Orthopaedic Surgery 01/08/2015, 7:06 AM  .

## 2015-01-20 ENCOUNTER — Other Ambulatory Visit: Payer: Self-pay | Admitting: *Deleted

## 2015-01-21 ENCOUNTER — Other Ambulatory Visit: Payer: Self-pay | Admitting: Cardiology

## 2015-01-21 MED ORDER — DILTIAZEM HCL ER COATED BEADS 180 MG PO CP24: 180.0000 mg | ORAL_CAPSULE | Freq: Every day | ORAL | Status: DC

## 2015-01-22 ENCOUNTER — Other Ambulatory Visit: Payer: Self-pay

## 2015-01-22 MED ORDER — DILTIAZEM HCL ER COATED BEADS 180 MG PO CP24: 180.0000 mg | ORAL_CAPSULE | Freq: Every day | ORAL | Status: DC

## 2015-01-27 ENCOUNTER — Ambulatory Visit (HOSPITAL_COMMUNITY)
Admission: RE | Admit: 2015-01-27 | Discharge: 2015-01-27 | Disposition: A | Discharge status: Home / Self Care | Payer: BLUE CROSS/BLUE SHIELD | Source: Ambulatory Visit | Attending: Orthopedic Surgery | Admitting: Orthopedic Surgery

## 2015-01-27 ENCOUNTER — Other Ambulatory Visit (HOSPITAL_COMMUNITY): Payer: Self-pay | Admitting: Orthopedic Surgery

## 2015-01-27 DIAGNOSIS — Z96652 Presence of left artificial knee joint: Secondary | ICD-10-CM

## 2015-01-27 NOTE — Progress Notes (Signed)
VASCULAR LAB PRELIMINARY  PRELIMINARY  PRELIMINARY  PRELIMINARY  Left lower extremity venous duplex completed.    Preliminary report:  Left:  No evidence of DVT or superficial thrombosis. There is an area of mixed echoes measuring approximately 8 cm coursing from the popliteal fossa into the proximal calf.  Sharnell Knight, RVS 01/27/2015, 5:37 PM

## 2015-02-25 ENCOUNTER — Other Ambulatory Visit: Payer: Self-pay

## 2015-02-25 MED ORDER — DILTIAZEM HCL ER COATED BEADS 180 MG PO CP24: 180.0000 mg | ORAL_CAPSULE | Freq: Every day | ORAL | Status: DC

## 2015-03-17 ENCOUNTER — Other Ambulatory Visit: Payer: Self-pay | Admitting: *Deleted

## 2015-03-17 MED ORDER — METOPROLOL SUCCINATE ER 25 MG PO TB24
25.0000 mg | ORAL_TABLET | Freq: Two times a day (BID) | ORAL | Status: DC
Start: 2015-03-17 — End: 2015-08-05

## 2015-03-18 ENCOUNTER — Ambulatory Visit: Payer: BC Managed Care – PPO | Admitting: Neurology

## 2015-03-18 ENCOUNTER — Ambulatory Visit: Payer: Self-pay | Admitting: Neurology

## 2015-03-19 ENCOUNTER — Telehealth: Payer: Self-pay

## 2015-03-19 NOTE — Telephone Encounter (Signed)
Received surgical clearance from Dr.Aluisio's office.Spoke to [place] Dr.Jordan out of office this week.Advised I will fax clearance back Monday 03/24/15.

## 2015-03-24 ENCOUNTER — Other Ambulatory Visit: Payer: Self-pay

## 2015-03-24 ENCOUNTER — Telehealth: Payer: Self-pay | Admitting: Cardiology

## 2015-03-24 DIAGNOSIS — E785 Hyperlipidemia, unspecified: Secondary | ICD-10-CM

## 2015-03-24 NOTE — Telephone Encounter (Signed)
Pt called in stating that since he has been switched to generic Crestor , he has been having some problems with sleeping . He says that when he looked it up on the internet he saw that insomnia may be a side effect of this medication. He would like to speak with the nurse about possibly finding another medication to take. Please call  Thanks

## 2015-03-24 NOTE — Telephone Encounter (Signed)
Returned call to patient he stated he recently had a left total knee replacement.Stated he is taking tramadol for pain.He has been having trouble sleeping,nausea.Stated he looked up crestor and tramadol and both can cause insomnia.Advised I will speak to Dr.Jordan and call you back.

## 2015-03-25 NOTE — Telephone Encounter (Signed)
Can this encounter be closed?

## 2015-03-26 NOTE — Telephone Encounter (Signed)
Returned call to patient spoke to Adams he advised to stop tramadol.Advised to continue crestor.Advised to call back if needed.

## 2015-04-04 ENCOUNTER — Telehealth: Payer: Self-pay

## 2015-04-04 NOTE — Telephone Encounter (Signed)
Received surgical clearance from Dr.Aluisio's office.Dr.Jordan cleared pt for surgery.Form faxed back 03/24/15 at fax # [phone].

## 2015-04-24 ENCOUNTER — Ambulatory Visit (INDEPENDENT_AMBULATORY_CARE_PROVIDER_SITE_OTHER): Payer: BLUE CROSS/BLUE SHIELD | Admitting: Neurology

## 2015-04-24 ENCOUNTER — Encounter: Payer: Self-pay | Admitting: Neurology

## 2015-04-24 VITALS — BP 116/60 | HR 62 | Resp 20 | Ht 69.0 in | Wt 167.0 lb

## 2015-04-24 DIAGNOSIS — R351 Nocturia: Secondary | ICD-10-CM | POA: Diagnosis not present

## 2015-04-24 NOTE — Progress Notes (Signed)
[facility]  Provider:  Dr Mayte Diers Referring Provider: [name], MD Primary Care Physician:  Robert Redwood, MD  Chief Complaint  Patient presents with  . Follow-up    cpap, rm 11, alone     HPI:  Robert [name] is a 69 y.o. male here as a  revisit  For Insomnia and OSA , referred originally  from Dr. Jasmine [name] , Urologist . Dr.  Brigitte [name] is this patient's  PCP.   Robert [name]  begun having fragmented sleep and finally  would wake up 5-7 times at night to relieve himself. He tried Flomax without success and then by the recently changed urologists and 2013. Dr. Jasmine [name] finally suspected that sleep apnea may be a contributing cause. He also had seen a dentist about the same time noticed him falling asleep in the dental chair and urgent to get an workup for obstructive sleep apnea. Dr. Brigitte [name], his primary care physician ordered a sleep study and the patient tested positive for apnea he was titrated to CPAP in a split night procedure the study was performed. The split night procedure took place on 08-16-12 the patient's BMI at that time was 25 his Epworth sleepiness score 4/24 points his neck circumference 15-1/2 inches. He and was to exhalatory at one point. Apnea hypotony index was 18.5 and RDI 18.9 there was no strong REM complement the supine complement. Lowest desaturation was 78% supine REM sleep was 32.8 minutes of desaturation.  The patient was titrated to 6 cm water pressure as an AHI of less than 5.  He then underwent a  Auto-  titration and compliance report through the [name] 2013 into January 2040,  he used his machine 6 hours 30 minutes at night was 100% compliant , his  AHI  was 2.9 in late January 2014 . Patient uses CPAP at 4 to 8 cm water autoset with a residual AHI of 1.7 and average user time 5 hours 27 minutes. Low air leak, 90 days of 93% compliance. He still has nocturia,  TURP procedure 1995, Epworth 5 and FSS 30 points,  Patient has found a 'cocktail "  that helps him to sleep , using an antihistamine along with prescription sleep aid.   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  He will [facility]. He has been having nightmares on unisom. He is treated with cialis for BPH(!). He is 80% better in terms of Nocturia.  He was on a [place] and woke up 3 times at night, but this was better than at home, and he thinks it was due to his higher level of physicial activity. Lorazepam is used PRN.  I would feel unconcerned about him using a benzodiazepine for the rest of his life.     Review of Systems: Out of a complete 14 system review, the patient complains of only the following symptoms, and all other reviewed systems are negative. Nocturia  is down  from 6-7 times at night to 3 times.  Epworth is  4 Epworth Sleepiness Scale today is score today is 4 points, and  FSS 29 points, and GDS  depression score zero  Point.  No longer nightmares , reduced Nocturia- but not resolved .   Dicylomine as sleep aid permitted, takes Malatonin.  History   Social History  . Marital Status: Married    Spouse Name: Robert [name]  . Number of Children: 3  . Years  of Education: Ph.D   Occupational History  . ADMIN     [employer]   Social History Main Topics  . Smoking status: Never Smoker   . Smokeless tobacco: Never Used  . Alcohol Use: 9.0 oz/week    7 Glasses of wine, 7 Shots of liquor, 1 Standard drinks or equivalent per week     Comment: each night  . Drug Use: No  . Sexual Activity: Yes   Other Topics Concern  . Not on file   Social History Narrative   Sleep apnea 28 AHI was reduced to 3.4- set to  7 cm water pressure on nasal mask eson or wisp.  He sleep  nonsupine, less nocturia.  Tennis ball method explained.  Patient does not want machine set to a certain pressure.     Downloaded CMS compliance s etsablsihed .  Insomnia not improved further - will try  seroquel as  medication approach and discussed behaviour therapy approach .   send for n beahiour therapy and started trial of SEROQUEL>  25 minute visit including CMS compliance and downloading.    Patient is married Robert [name]) and lives at home with his wife.   Patient has three children and his wife has three children.   Patient is working full-time.   Patient has a Ph.D.   Patient is right-handed.   Patient drinks about 21 oz of coffee daily.          Family History  Problem Relation Age of Onset  . Emphysema Mother   . Heart attack Father   . Hypertension Father   . Hypertension Other   . High Cholesterol Other     Past Medical History  Diagnosis Date  . Hypertension   . Hyperlipidemia   . S/P cardiac catheterization 1995 and 1998    a. following abnormal stress tests. Both reportedly normal;  b. 02/2013 nl myoview.  . Anxiety   . Neuropathy     SECONDARY TO CHEMOTHERAPY  . Nocturia associated with benign prostatic hypertrophy 03/21/2013  . Palpitations   . Colon cancer     a. s/p colon resection in 2004  . Complication of anesthesia     patient does not like spinals   . Kidney stones   . Arthritis   . Edema of right foot     ? muscle problem to be addressed after left knee TKA on 01/06/2015 per patient   . Sleep apnea with use of continuous positive airway pressure (CPAP)     cpap- 72 for temp 9 for res 3.5 humidity only wears 4 hours per patient    Past Surgical History  Procedure Laterality Date  . Cardiac catheterization  07/04/1997  . Colon resection  2004    x 2 per patient   . L4 discectomy    . Elbow surgery Left 2011    INFECTED OLECRANON BURSITIS SURGERY  . Transurethral resection of prostate    . Retinal detachment surgery  1998  . Septic joint    . Total knee arthroplasty Left 01/06/2015    Procedure: LEFT TOTAL KNEE ARTHROPLASTY;  Surgeon: Gaynelle Arabian, MD;  Location: [facility];  Service: Orthopedics;  Laterality: Left;    Current Outpatient  Prescriptions  Medication Sig Dispense Refill  . CIALIS 5 MG tablet Take 5 mg by mouth daily.     Marland Kitchen diltiazem (CARDIZEM CD) 180 MG 24 hr capsule Take 1 capsule (180 mg total) by mouth daily. pt needs appointment before any more refills 90  capsule 0  . DM-Doxylamine-Acetaminophen (NYQUIL COLD & FLU PO) Take 1 capsule by mouth at bedtime as needed (FOR SLEEP).    Marland Kitchen lisinopril-hydrochlorothiazide (PRINZIDE,ZESTORETIC) 20-12.5 MG per tablet Take 1 tablet by mouth every morning.     Marland Kitchen LORazepam (ATIVAN) 0.5 MG tablet Take 0.5 mg by mouth daily as needed. When heart races    . metoprolol succinate (TOPROL-XL) 25 MG 24 hr tablet Take 1 tablet (25 mg total) by mouth 2 (two) times daily. 90 tablet 2  . NON FORMULARY CPAP MACHINE    . rosuvastatin (CRESTOR) 10 MG tablet Take 1 tablet (10 mg total) by mouth every evening. 90 tablet 3  . [DISCONTINUED] hyoscyamine (LEVSIN SL) 0.125 MG SL tablet      No current facility-administered medications for this visit.    Allergies as of 04/24/2015 - Review Complete 04/24/2015  Allergen Reaction Noted  . Betadine [povidone iodine] Other (See Comments) 12/31/2014  . Other  03/29/2012    Vitals: BP 116/60 mmHg  [name] 62  Resp 20  Ht 5\' 9"  (1.753 m)  Wt 167 lb (75.751 kg)  BMI 24.65 kg/m2 Last Weight:  Wt Readings from Last 1 Encounters:  04/24/15 167 lb (75.751 kg)   Last Height:   Ht Readings from Last 1 Encounters:  04/24/15 5\' 9"  (1.753 m)   Physical exam:  General: The patient is awake, alert and appears not in acute distress. The patient is well groomed. Head: Normocephalic, atraumatic. Neck is supple. Mallampati 2 , neck circumference:16. No retrognathia.  Cardiovascular:  Regular rate and rhythm , without  murmurs or carotid bruit, and without distended neck veins. Respiratory: Lungs are clear to auscultation. Skin:  Without evidence of edema, or rash Trunk: BMI is  normal posture.  Neurologic exam : The patient is awake and alert,  oriented to place and time.   Memory subjective  described as intact. There is a normal attention span & concentration ability.  Speech is fluent without  dysarthria, dysphonia or aphasia. Mood and affect are appropriate.  Cranial nerves: Pupils are equal and briskly reactive to light. Funduscopic exam without evidence of pallor or edema. Extraocular movements  in vertical and horizontal planes intact and without nystagmus. Visual fields by finger perimetry are intact. Hearing to finger rub intact.  Facial sensation intact to fine touch. Facial motor strength is symmetric and tongue and uvula move midline.  Motor exam:   Normal tone and normal muscle bulk and symmetric normal strength in all extremities.  Sensory:  Fine touch, pinprick and vibration were tested in all extremities. Proprioception is  normal.  Coordination: Rapid alternating movements in the fingers/hands is tested and normal.  Gait and station: Patient walks without assistive device and is able and assisted stool climb up to the exam table.  Deep tendon reflexes: in the  upper and lower extremities are symmetric and intact.   Assessment:  After physical and neurologic examination, review of laboratory studies, imaging, neurophysiology testing and pre-existing records, assessment :  OSA is best controlled, there is no further improvement likely. AHI 1.7 in 2015 - waiting for a 2016 download from [facility].   He has had maximum benefit of apnea treatment on nocturia, insomnia .    Will follow yearly from now.         Plan:  Treatment plan and additional workup will be reviewed under Problem List.

## 2015-04-28 ENCOUNTER — Telehealth: Payer: Self-pay | Admitting: Cardiology

## 2015-04-28 DIAGNOSIS — E785 Hyperlipidemia, unspecified: Secondary | ICD-10-CM

## 2015-04-28 LAB — LIPID PANEL
CHOLESTEROL: 160 mg/dL (ref 125–200)
HDL: 52 mg/dL (ref >=40)
LDL CALC: 78 mg/dL (ref <130)
TRIGLYCERIDES: 150 mg/dL — AB (ref <150)
Total CHOL/HDL Ratio: 3.1 Ratio (ref <=5.0)
VLDL: 30 mg/dL (ref <30)

## 2015-04-28 LAB — HEPATIC FUNCTION PANEL
ALK PHOS: 81 U/L (ref 40–115)
ALT: 22 U/L (ref 9–46)
AST: 20 U/L (ref 10–35)
Albumin: 4.5 g/dL (ref 3.6–5.1)
BILIRUBIN TOTAL: 0.5 mg/dL (ref 0.2–1.2)
Bilirubin, Direct: 0.1 mg/dL (ref <=0.2)
Indirect Bilirubin: 0.4 mg/dL (ref 0.2–1.2)
TOTAL PROTEIN: 7 g/dL (ref 6.1–8.1)

## 2015-04-28 NOTE — Telephone Encounter (Signed)
Lipid order released and LFT ordered. Per Malachy Mood, LPN, Dr. Martinique likes BMET, lipid, liver if none within last 6 months. BMET done 3-4 months ago.

## 2015-05-05 ENCOUNTER — Ambulatory Visit (INDEPENDENT_AMBULATORY_CARE_PROVIDER_SITE_OTHER): Payer: BLUE CROSS/BLUE SHIELD | Admitting: Cardiology

## 2015-05-05 ENCOUNTER — Encounter: Payer: Self-pay | Admitting: Cardiology

## 2015-05-05 VITALS — BP 111/57 | HR 60 | Ht 69.0 in | Wt 171.0 lb

## 2015-05-05 DIAGNOSIS — I10 Essential (primary) hypertension: Secondary | ICD-10-CM

## 2015-05-05 DIAGNOSIS — I2510 Atherosclerotic heart disease of native coronary artery without angina pectoris: Secondary | ICD-10-CM | POA: Diagnosis not present

## 2015-05-05 DIAGNOSIS — E785 Hyperlipidemia, unspecified: Secondary | ICD-10-CM

## 2015-05-05 NOTE — Patient Instructions (Signed)
Continue your current therapy  I will see you in 6 months.   

## 2015-05-05 NOTE — Progress Notes (Signed)
Robert [name] Date of Birth: 05/13/1946   History of Present Illness: Robert [name] is seen today for followup HTN. He is overall doing well. He reports his BP has been under excellent control. Still notes some palpitations at night with HR up to 90. He no longer takes extra metoprolol because this made his BP too low. He had successful left TKR and is planning to have right knee done in Nov. He notes Dr. [name] recommended switching ACEi to ARB due to cough. He states he has a mild cough at times but it doesn't really bother him so he doesn't want to change meds at this time.   Current Outpatient Prescriptions on File Prior to Visit  Medication Sig Dispense Refill  . CIALIS 5 MG tablet Take 5 mg by mouth daily.     Marland Kitchen diltiazem (CARDIZEM CD) 180 MG 24 hr capsule Take 1 capsule (180 mg total) by mouth daily. pt needs appointment before any more refills 90 capsule 0  . DM-Doxylamine-Acetaminophen (NYQUIL COLD & FLU PO) Take 1 capsule by mouth at bedtime as needed (FOR SLEEP).    Marland Kitchen lisinopril-hydrochlorothiazide (PRINZIDE,ZESTORETIC) 20-12.5 MG per tablet Take 1 tablet by mouth every morning.     Marland Kitchen LORazepam (ATIVAN) 0.5 MG tablet Take 0.5 mg by mouth daily as needed. When heart races    . metoprolol succinate (TOPROL-XL) 25 MG 24 hr tablet Take 1 tablet (25 mg total) by mouth 2 (two) times daily. 90 tablet 2  . NON FORMULARY CPAP MACHINE    . rosuvastatin (CRESTOR) 10 MG tablet Take 1 tablet (10 mg total) by mouth every evening. 90 tablet 3  . [DISCONTINUED] hyoscyamine (LEVSIN SL) 0.125 MG SL tablet      No current facility-administered medications on file prior to visit.    Allergies  Allergen Reactions  . Betadine [Povidone Iodine] Other (See Comments)    Causes burning to skin   . Other     Allergic to dye used when you give blood. Says its an orange dye- at [facility] of preop appointment on 12/31/2014- the orange dye patient is describing is betadine , betadine listed on allergies     Past  Medical History  Diagnosis Date  . Hypertension   . Hyperlipidemia   . S/P cardiac catheterization 1995 and 1998    a. following abnormal stress tests. Both reportedly normal;  b. 02/2013 nl myoview.  . Anxiety   . Neuropathy     SECONDARY TO CHEMOTHERAPY  . Nocturia associated with benign prostatic hypertrophy 03/21/2013  . Palpitations   . Colon cancer     a. s/p colon resection in 2004  . Complication of anesthesia     patient does not like spinals   . Kidney stones   . Arthritis   . Edema of right foot     ? muscle problem to be addressed after left knee TKA on 01/06/2015 per patient   . Sleep apnea with use of continuous positive airway pressure (CPAP)     cpap- 72 for temp 9 for res 3.5 humidity only wears 4 hours per patient    Past Surgical History  Procedure Laterality Date  . Cardiac catheterization  07/04/1997  . Colon resection  2004    x 2 per patient   . L4 discectomy    . Elbow surgery Left 2011    INFECTED OLECRANON BURSITIS SURGERY  . Transurethral resection of prostate    . Retinal detachment surgery  1998  . Septic  joint    . Total knee arthroplasty Left 01/06/2015    Procedure: LEFT TOTAL KNEE ARTHROPLASTY;  Surgeon: Gaynelle Arabian, MD;  Location: [facility];  Service: Orthopedics;  Laterality: Left;    History  Smoking status  . Never Smoker   Smokeless tobacco  . Never Used    History  Alcohol Use  . 9.0 oz/week  . 7 Glasses of wine, 7 Shots of liquor, 1 Standard drinks or equivalent per week    Comment: each night    Family History  Problem Relation Age of Onset  . Emphysema Mother   . Heart attack Father   . Hypertension Father   . Hypertension Other   . High Cholesterol Other     Review of Systems: As noted in HPI. All other systems are reviewed and are negative.  Physical Exam: BP 111/57 mmHg  Pulse 60  Ht 5\' 9"  (1.753 m)  Wt 77.565 kg (171 lb)  BMI 25.24 kg/m2 He is a pleasant white male in no acute distress. His HEENT exam is  unremarkable. He has no JVD or bruits. Lungs are clear. Cardiac exam reveals a regular rate and rhythm without gallop, murmur, or click. Abdomen is soft and nontender. He has no edema. Pedal pulses are good.  LABORATORY DATA:   Assessment / Plan: 1. Hypertension. Blood pressure is under excellent control currently.   2. Hyperlipidemia, controlled on Crestor and fish oil.  3. Coronary calcification noted incidentally on a CT scan. This was in the mid RCA distribution. Stress Myoview study 03/23/13 was negative for ischemia. Ejection fraction was normal.  4. Obstructive sleep apnea now on CPAP.  5. BPH improved on Cialis  He will let me know if cough worsens in which case will switch to an ARB. Otherwise follow up in 6 months.

## 2015-05-07 ENCOUNTER — Other Ambulatory Visit: Payer: Self-pay | Admitting: General Surgery

## 2015-05-07 DIAGNOSIS — K432 Incisional hernia without obstruction or gangrene: Secondary | ICD-10-CM

## 2015-05-12 ENCOUNTER — Other Ambulatory Visit: Payer: BLUE CROSS/BLUE SHIELD

## 2015-05-13 ENCOUNTER — Ambulatory Visit
Admission: RE | Admit: 2015-05-13 | Discharge: 2015-05-13 | Disposition: A | Discharge status: Home / Self Care | Payer: BLUE CROSS/BLUE SHIELD | Source: Ambulatory Visit | Attending: General Surgery | Admitting: General Surgery

## 2015-05-13 DIAGNOSIS — K432 Incisional hernia without obstruction or gangrene: Secondary | ICD-10-CM

## 2015-05-13 MED ORDER — IOPAMIDOL (ISOVUE-300) INJECTION 61%: 100.0000 mL | Freq: Once | INTRAVENOUS | Status: DC | PRN

## 2015-05-19 ENCOUNTER — Other Ambulatory Visit: Payer: Self-pay | Admitting: *Deleted

## 2015-05-19 MED ORDER — LISINOPRIL-HYDROCHLOROTHIAZIDE 20-12.5 MG PO TABS: 1.0000 | ORAL_TABLET | Freq: Every morning | ORAL | Status: DC

## 2015-05-25 ENCOUNTER — Ambulatory Visit: Payer: Self-pay | Admitting: General Surgery

## 2015-05-26 NOTE — Pre-Procedure Instructions (Signed)
    Robert [name]  05/26/2015      Your procedure is scheduled on : Tuesday, September 6.  Report to [facility] Admitting at 5:30 A.M.              Surgery is scheduled for 7:30 A. M.  Call this number if you have problems the morning of surgery:[phone]                      For any other questions, please call [phone], Monday - Friday 8 AM - 4 PM.   Remember:  Do not eat food or drink liquids after midnight Monday, September 5.  Take these medicines the morning of surgery with A SIP OF WATER :diltiazem (CARDIZEM), metoprolol succinate (TOPROL-XL).                 Take if needed: Ativan.              Stop taking Aspirin, Vitamins, Fish Oil.  DO not take Advil or Aleve or any Aspirin products.   Do not wear jewelry, make-up or nail polish.  Do not wear lotions, powders, or perfumes.    Do not shave 48 hours prior to surgery.              Men may shave face and neck.  Do not bring valuables to the hospital.  [facility] is not responsible for any belongings or valuables.  Contacts, dentures or bridgework may not be worn into surgery.  Leave your suitcase in the car.  After surgery it may be brought to your room.  For patients admitted to the hospital, discharge time will be determined by your treatment team.  Patients discharged the day of surgery will not be allowed to drive home.   Name and phone number of your driver:  -  Special instructions:  Review  No Name - Preparing For Surgery.  Please read over the following fact sheets that you were given. Pain Booklet, Coughing and Deep Breathing and Surgical Site Infection Prevention

## 2015-05-27 ENCOUNTER — Encounter (HOSPITAL_COMMUNITY): Payer: Self-pay

## 2015-05-27 ENCOUNTER — Encounter (HOSPITAL_COMMUNITY)
Admission: RE | Admit: 2015-05-27 | Discharge: 2015-05-27 | Disposition: A | Discharge status: Home / Self Care | Payer: BLUE CROSS/BLUE SHIELD | Source: Ambulatory Visit | Attending: General Surgery | Admitting: General Surgery

## 2015-05-27 DIAGNOSIS — Z01812 Encounter for preprocedural laboratory examination: Secondary | ICD-10-CM | POA: Diagnosis present

## 2015-05-27 DIAGNOSIS — M179 Osteoarthritis of knee, unspecified: Secondary | ICD-10-CM | POA: Insufficient documentation

## 2015-05-27 HISTORY — DX: Personal history of urinary calculi: Z87.442

## 2015-05-27 LAB — BASIC METABOLIC PANEL
ANION GAP: 8 (ref 5–15)
BUN: 18 mg/dL (ref 6–20)
CALCIUM: 10.4 mg/dL — AB (ref 8.9–10.3)
CHLORIDE: 102 mmol/L (ref 101–111)
CO2: 26 mmol/L (ref 22–32)
CREATININE: 0.95 mg/dL (ref 0.61–1.24)
GFR calc non Af Amer: >60 mL/min (ref >60)
Glucose, Bld: 102 mg/dL — ABNORMAL HIGH (ref 65–99)
Potassium: 4.4 mmol/L (ref 3.5–5.1)
SODIUM: 136 mmol/L (ref 135–145)

## 2015-05-27 LAB — CBC
HEMATOCRIT: 45.8 % (ref 39.0–52.0)
HEMOGLOBIN: 15.2 g/dL (ref 13.0–17.0)
MCH: 28.7 pg (ref 26.0–34.0)
MCHC: 33.2 g/dL (ref 30.0–36.0)
MCV: 86.4 fL (ref 78.0–100.0)
Platelets: 185 10*3/uL (ref 150–400)
RBC: 5.3 MIL/uL (ref 4.22–5.81)
RDW: 14.2 % (ref 11.5–15.5)
WBC: 6.7 10*3/uL (ref 4.0–10.5)

## 2015-06-03 ENCOUNTER — Encounter (HOSPITAL_COMMUNITY)
Admission: RE | Disposition: A | Discharge status: Home / Self Care | Payer: Self-pay | Source: Ambulatory Visit | Attending: General Surgery

## 2015-06-03 ENCOUNTER — Ambulatory Visit (HOSPITAL_COMMUNITY): Payer: BLUE CROSS/BLUE SHIELD | Admitting: Certified Registered Nurse Anesthetist

## 2015-06-03 ENCOUNTER — Ambulatory Visit (HOSPITAL_COMMUNITY)
Admission: RE | Admit: 2015-06-03 | Discharge: 2015-06-03 | Disposition: A | Discharge status: Home / Self Care | Payer: BLUE CROSS/BLUE SHIELD | Source: Ambulatory Visit | Attending: General Surgery | Admitting: General Surgery

## 2015-06-03 ENCOUNTER — Encounter (HOSPITAL_COMMUNITY): Payer: Self-pay | Admitting: *Deleted

## 2015-06-03 DIAGNOSIS — E780 Pure hypercholesterolemia: Secondary | ICD-10-CM | POA: Insufficient documentation

## 2015-06-03 DIAGNOSIS — F419 Anxiety disorder, unspecified: Secondary | ICD-10-CM | POA: Diagnosis not present

## 2015-06-03 DIAGNOSIS — K4090 Unilateral inguinal hernia, without obstruction or gangrene, not specified as recurrent: Secondary | ICD-10-CM | POA: Insufficient documentation

## 2015-06-03 DIAGNOSIS — Z79899 Other long term (current) drug therapy: Secondary | ICD-10-CM | POA: Diagnosis not present

## 2015-06-03 DIAGNOSIS — I2510 Atherosclerotic heart disease of native coronary artery without angina pectoris: Secondary | ICD-10-CM | POA: Diagnosis not present

## 2015-06-03 DIAGNOSIS — G4733 Obstructive sleep apnea (adult) (pediatric): Secondary | ICD-10-CM | POA: Diagnosis not present

## 2015-06-03 DIAGNOSIS — Z85038 Personal history of other malignant neoplasm of large intestine: Secondary | ICD-10-CM | POA: Insufficient documentation

## 2015-06-03 DIAGNOSIS — Z9049 Acquired absence of other specified parts of digestive tract: Secondary | ICD-10-CM | POA: Diagnosis not present

## 2015-06-03 DIAGNOSIS — I10 Essential (primary) hypertension: Secondary | ICD-10-CM | POA: Insufficient documentation

## 2015-06-03 HISTORY — PX: INGUINAL HERNIA REPAIR: SHX194

## 2015-06-03 HISTORY — PX: INSERTION OF MESH: SHX5868

## 2015-06-03 SURGERY — REPAIR, HERNIA, INGUINAL, ADULT
Anesthesia: General | Site: Groin | Laterality: Right

## 2015-06-03 MED ORDER — HYDROMORPHONE HCL 1 MG/ML IJ SOLN
0.2500 mg | INTRAMUSCULAR | Status: DC | PRN
  Administered 2015-06-03 (×3): 0.5 mg via INTRAVENOUS

## 2015-06-03 MED ORDER — PROPOFOL 10 MG/ML IV BOLUS
INTRAVENOUS | Status: AC
  Filled 2015-06-03: qty 20

## 2015-06-03 MED ORDER — MIDAZOLAM HCL 5 MG/5ML IJ SOLN
INTRAMUSCULAR | Status: DC | PRN
  Administered 2015-06-03: 2 mg via INTRAVENOUS

## 2015-06-03 MED ORDER — BUPIVACAINE-EPINEPHRINE 0.25% -1:200000 IJ SOLN
INTRAMUSCULAR | Status: DC | PRN
  Administered 2015-06-03: 30 mL

## 2015-06-03 MED ORDER — EPHEDRINE SULFATE 50 MG/ML IJ SOLN
INTRAMUSCULAR | Status: AC
  Filled 2015-06-03: qty 1

## 2015-06-03 MED ORDER — SUCCINYLCHOLINE CHLORIDE 20 MG/ML IJ SOLN
INTRAMUSCULAR | Status: AC
  Filled 2015-06-03: qty 1

## 2015-06-03 MED ORDER — ONDANSETRON HCL 4 MG/2ML IJ SOLN
INTRAMUSCULAR | Status: DC | PRN
Start: 2015-06-03 — End: 2015-06-03
  Administered 2015-06-03 (×2): 4 mg via INTRAVENOUS

## 2015-06-03 MED ORDER — PROPOFOL 10 MG/ML IV BOLUS
INTRAVENOUS | Status: DC | PRN
  Administered 2015-06-03: 30 mg via INTRAVENOUS
  Administered 2015-06-03: 200 mg via INTRAVENOUS

## 2015-06-03 MED ORDER — MIDAZOLAM HCL 2 MG/2ML IJ SOLN
INTRAMUSCULAR | Status: AC
  Filled 2015-06-03: qty 4

## 2015-06-03 MED ORDER — HYDROCODONE-ACETAMINOPHEN 5-325 MG PO TABS: 1.0000 | ORAL_TABLET | ORAL | Status: DC | PRN

## 2015-06-03 MED ORDER — EPHEDRINE SULFATE 50 MG/ML IJ SOLN
INTRAMUSCULAR | Status: DC | PRN
  Administered 2015-06-03 (×5): 5 mg via INTRAVENOUS

## 2015-06-03 MED ORDER — OXYCODONE HCL 5 MG PO TABS: 5.0000 mg | ORAL_TABLET | ORAL | Status: DC | PRN

## 2015-06-03 MED ORDER — HYDROMORPHONE HCL 1 MG/ML IJ SOLN
INTRAMUSCULAR | Status: AC
  Filled 2015-06-03: qty 1

## 2015-06-03 MED ORDER — CEFAZOLIN SODIUM-DEXTROSE 2-3 GM-% IV SOLR
2.0000 g | INTRAVENOUS | Status: AC
  Administered 2015-06-03: 2 g via INTRAVENOUS
  Filled 2015-06-03: qty 50

## 2015-06-03 MED ORDER — SODIUM CHLORIDE 0.9 % IJ SOLN
INTRAMUSCULAR | Status: AC
  Filled 2015-06-03: qty 10

## 2015-06-03 MED ORDER — BUPIVACAINE-EPINEPHRINE (PF) 0.25% -1:200000 IJ SOLN
INTRAMUSCULAR | Status: AC
  Filled 2015-06-03: qty 30

## 2015-06-03 MED ORDER — LIDOCAINE HCL (CARDIAC) 20 MG/ML IV SOLN
INTRAVENOUS | Status: AC
  Filled 2015-06-03: qty 10

## 2015-06-03 MED ORDER — PHENYLEPHRINE 40 MCG/ML (10ML) SYRINGE FOR IV PUSH (FOR BLOOD PRESSURE SUPPORT)
PREFILLED_SYRINGE | INTRAVENOUS | Status: AC
  Filled 2015-06-03: qty 20

## 2015-06-03 MED ORDER — GLYCOPYRROLATE 0.2 MG/ML IJ SOLN
INTRAMUSCULAR | Status: AC
  Filled 2015-06-03: qty 4

## 2015-06-03 MED ORDER — PHENYLEPHRINE HCL 10 MG/ML IJ SOLN
INTRAMUSCULAR | Status: DC | PRN
  Administered 2015-06-03: 40 ug via INTRAVENOUS

## 2015-06-03 MED ORDER — ONDANSETRON HCL 4 MG/2ML IJ SOLN
INTRAMUSCULAR | Status: AC
  Filled 2015-06-03: qty 4

## 2015-06-03 MED ORDER — LACTATED RINGERS IV SOLN
INTRAVENOUS | Status: DC | PRN
  Administered 2015-06-03 (×2): via INTRAVENOUS

## 2015-06-03 MED ORDER — DEXAMETHASONE SODIUM PHOSPHATE 4 MG/ML IJ SOLN
INTRAMUSCULAR | Status: AC
  Filled 2015-06-03: qty 3

## 2015-06-03 MED ORDER — DEXAMETHASONE SODIUM PHOSPHATE 4 MG/ML IJ SOLN
INTRAMUSCULAR | Status: DC | PRN
  Administered 2015-06-03: 4 mg via INTRAVENOUS

## 2015-06-03 MED ORDER — 0.9 % SODIUM CHLORIDE (POUR BTL) OPTIME
TOPICAL | Status: DC | PRN
  Administered 2015-06-03: 1000 mL

## 2015-06-03 MED ORDER — FENTANYL CITRATE (PF) 250 MCG/5ML IJ SOLN
INTRAMUSCULAR | Status: AC
  Filled 2015-06-03: qty 5

## 2015-06-03 MED ORDER — ONDANSETRON HCL 4 MG/2ML IJ SOLN
INTRAMUSCULAR | Status: AC
  Filled 2015-06-03: qty 8

## 2015-06-03 MED ORDER — LIDOCAINE HCL (CARDIAC) 20 MG/ML IV SOLN
INTRAVENOUS | Status: DC | PRN
  Administered 2015-06-03: 80 mg via INTRAVENOUS

## 2015-06-03 MED ORDER — ROCURONIUM BROMIDE 50 MG/5ML IV SOLN
INTRAVENOUS | Status: AC
  Filled 2015-06-03: qty 1

## 2015-06-03 MED ORDER — FENTANYL CITRATE (PF) 100 MCG/2ML IJ SOLN
INTRAMUSCULAR | Status: DC | PRN
  Administered 2015-06-03: 50 ug via INTRAVENOUS
  Administered 2015-06-03: 100 ug via INTRAVENOUS

## 2015-06-03 MED ORDER — CHLORHEXIDINE GLUCONATE 4 % EX LIQD: 1.0000 "application " | Freq: Once | CUTANEOUS | Status: DC

## 2015-06-03 SURGICAL SUPPLY — 51 items
BLADE SURG 10 STRL SS (BLADE) ×2 IMPLANT
BLADE SURG 15 STRL LF DISP TIS (BLADE) ×1 IMPLANT
BLADE SURG 15 STRL SS (BLADE) ×2
BLADE SURG ROTATE 9660 (MISCELLANEOUS) ×1 IMPLANT
CANISTER SUCTION 2500CC (MISCELLANEOUS) ×2 IMPLANT
CHLORAPREP W/TINT 26ML (MISCELLANEOUS) ×2 IMPLANT
COVER SURGICAL LIGHT HANDLE (MISCELLANEOUS) ×2 IMPLANT
DRAIN PENROSE 1/2X12 LTX STRL (WOUND CARE) ×1 IMPLANT
DRAPE PED LAPAROTOMY (DRAPES) ×2 IMPLANT
DRAPE UTILITY XL STRL (DRAPES) ×3 IMPLANT
ELECT CAUTERY BLADE 6.4 (BLADE) ×2 IMPLANT
ELECT REM PT RETURN 9FT ADLT (ELECTROSURGICAL) ×2
ELECTRODE REM PT RTRN 9FT ADLT (ELECTROSURGICAL) ×1 IMPLANT
GLOVE BIO SURGEON STRL SZ8 (GLOVE) ×3 IMPLANT
GLOVE BIOGEL PI IND STRL 7.0 (GLOVE) IMPLANT
GLOVE BIOGEL PI IND STRL 7.5 (GLOVE) IMPLANT
GLOVE BIOGEL PI IND STRL 8 (GLOVE) ×1 IMPLANT
GLOVE BIOGEL PI INDICATOR 7.0 (GLOVE) ×1
GLOVE BIOGEL PI INDICATOR 7.5 (GLOVE) ×1
GLOVE BIOGEL PI INDICATOR 8 (GLOVE) ×2
GLOVE SURG SS PI 7.0 STRL IVOR (GLOVE) ×1 IMPLANT
GLOVE SURG SS PI 7.5 STRL IVOR (GLOVE) ×1 IMPLANT
GOWN STRL REUS W/ TWL LRG LVL3 (GOWN DISPOSABLE) ×1 IMPLANT
GOWN STRL REUS W/ TWL XL LVL3 (GOWN DISPOSABLE) ×1 IMPLANT
GOWN STRL REUS W/TWL LRG LVL3 (GOWN DISPOSABLE) ×4
GOWN STRL REUS W/TWL XL LVL3 (GOWN DISPOSABLE) ×2
KIT BASIN OR (CUSTOM PROCEDURE TRAY) ×2 IMPLANT
KIT ROOM TURNOVER OR (KITS) ×2 IMPLANT
LIQUID BAND (GAUZE/BANDAGES/DRESSINGS) ×2 IMPLANT
MESH HERNIA 3X6 (Mesh General) ×1 IMPLANT
NEEDLE 22X1 1/2 (OR ONLY) (NEEDLE) ×2 IMPLANT
NS IRRIG 1000ML POUR BTL (IV SOLUTION) ×2 IMPLANT
PACK SURGICAL SETUP 50X90 (CUSTOM PROCEDURE TRAY) ×2 IMPLANT
PAD ARMBOARD 7.5X6 YLW CONV (MISCELLANEOUS) ×2 IMPLANT
PENCIL BUTTON HOLSTER BLD 10FT (ELECTRODE) ×2 IMPLANT
PLUG ATRIUM AND PATCH X LRG (MISCELLANEOUS) IMPLANT
SPECIMEN JAR SMALL (MISCELLANEOUS) IMPLANT
SPONGE LAP 18X18 X RAY DECT (DISPOSABLE) ×2 IMPLANT
SUT MNCRL AB 4-0 PS2 18 (SUTURE) ×2 IMPLANT
SUT PROLENE 2 0 CT2 30 (SUTURE) ×6 IMPLANT
SUT VIC AB 2-0 SH 27 (SUTURE) ×2
SUT VIC AB 2-0 SH 27X BRD (SUTURE) ×1 IMPLANT
SUT VIC AB 3-0 SH 27 (SUTURE) ×2
SUT VIC AB 3-0 SH 27X BRD (SUTURE) ×1 IMPLANT
SUT VICRYL AB 3 0 TIES (SUTURE) IMPLANT
SYR BULB 3OZ (MISCELLANEOUS) ×2 IMPLANT
SYR CONTROL 10ML LL (SYRINGE) ×2 IMPLANT
TOWEL OR 17X24 6PK STRL BLUE (TOWEL DISPOSABLE) ×2 IMPLANT
TOWEL OR 17X26 10 PK STRL BLUE (TOWEL DISPOSABLE) ×2 IMPLANT
TUBE CONNECTING 12X1/4 (SUCTIONS) ×2 IMPLANT
YANKAUER SUCT BULB TIP NO VENT (SUCTIONS) ×2 IMPLANT

## 2015-06-03 NOTE — Interval H&P Note (Signed)
History and Physical Interval Note:  06/03/2015 6:52 AM  Robert [name]  has presented today for surgery, with the diagnosis of right inguinal hernia  The various methods of treatment have been discussed with the patient and family. After consideration of risks, benefits and other options for treatment, the patient has consented to  Procedure(s): REPAIR RIGHT INGUINAL HERNIA (Right) INSERTION OF MESH (Right) as a surgical intervention .  The patient's history has been reviewed, patient examined, site marked, no change in status, stable for surgery.  I have reviewed the patient's chart and labs.  Questions were answered to the patient's satisfaction.     Shaolin Armas E

## 2015-06-03 NOTE — Anesthesia Procedure Notes (Signed)
Procedure Name: LMA Insertion Date/Time: 06/03/2015 7:50 AM Performed by: Merdis Delay Pre-anesthesia Checklist: Patient identified, Patient being monitored, Emergency Drugs available, Timeout performed and Suction available Patient Re-evaluated:Patient Re-evaluated prior to inductionOxygen Delivery Method: Circle system utilized Preoxygenation: Pre-oxygenation with 100% oxygen Intubation Type: IV induction LMA: LMA inserted LMA Size: 5.0 Number of attempts: 1 Placement Confirmation: positive ETCO2,  CO2 detector and breath sounds checked- equal and bilateral Tube secured with: Tape Dental Injury: Teeth and Oropharynx as per pre-operative assessment

## 2015-06-03 NOTE — Transfer of Care (Signed)
Immediate Anesthesia Transfer of Care Note  Patient: Robert [name]  Procedure(s) Performed: Procedure(s): REPAIR RIGHT INGUINAL HERNIA (Right) INSERTION OF MESH (Right)  Patient Location: PACU  Anesthesia Type:General  Level of Consciousness: sedated  Airway & Oxygen Therapy: Patient Spontanous Breathing and Patient connected to nasal cannula oxygen  Post-op Assessment: Report given to RN and Post -op Vital signs reviewed and stable  Post vital signs: Reviewed and stable  Last Vitals:  Filed Vitals:   06/03/15 0624  BP: 106/59  Pulse: 52  Temp: 36.3 C  Resp: 20    Complications: No apparent anesthesia complications

## 2015-06-03 NOTE — Op Note (Signed)
06/03/2015  8:44 AM  PATIENT:  Robert [name]  69 y.o. male  PRE-OPERATIVE DIAGNOSIS:  right inguinal hernia  POST-OPERATIVE DIAGNOSIS:  right inguinal hernia  PROCEDURE:  Procedure(s): REPAIR RIGHT INGUINAL HERNIA INSERTION OF MESH  SURGEON:  Surgeon(s): Georganna Skeans, MD  ASSISTANTS: none   ANESTHESIA:   local and general  EBL:  Total I/O In: 1050 [I.V.:1050] Out: -   BLOOD ADMINISTERED:none  DRAINS: none   SPECIMEN:  No Specimen  DISPOSITION OF SPECIMEN:  PATHOLOGY  COUNTS:  YES  DICTATION: .Dragon Dictation Findings: Large cord lipoma, indirect hernia  [name] presents for right inguinal hernia repair with mesh. He was identified in the preop holding area. Informed consent was obtained. He received intravenous antibiotics. He was brought to the operating room and general anesthesia with laryngeal mask airway was administered by the anesthesia staff. His lower abdomen and groins were prepped and draped in a sterile fashion. Time out procedure was performed. I injected local anesthetic out towards his anterior superior iliac spine for postoperative pain relief. It was also injected along the planned line of incision in the right groin. Right groin incision was made. Subcutaneous tissues were dissected down using cautery revealing the external oblique fascia. This was very attenuated due to the hernia. The lateral portion was divided down into the external ring area. The inferior leaflet was dissected down revealing the shelving edge of inguinal ligament. Cord structures were encircled with a Penrose drain. Superior leaflet of the external oblique was dissected off the transversalis. Cord was inspected. Inguinal floor was intact. Cord was dissected revealing a large cord lipoma which was excised. Additionally, an indirect hernia sac containing fat was dissected free off cord structures. The sac was opened and the fat content was reduced back into the abdomen easily. The sac  was high ligated with Vicryl. Hernia repair was completed with a keyhole polypropylene mesh cut to custom size and shape. It was sewn with 2-0 Prolene to the tissues over the pubic tubercle and in a running fashion along the shelving edge of the angle ligament inferiorly. Superiorly, indirect sutures were placed to the tissues over the pubic tubercle again along the transversalis continuing back laterally. The 2 leaflets of the mesh were tucked down out laterally beneath the external oblique fascia. They were tacked together and to the underlying musculature with interrupted 2-0 Prolene. The area was irrigated. Hemostasis was ensured. Additional local was injected. The external oblique fascia was closed as I was able with running 2-0 Vicryl. 16 his tissues were irrigated and approximated with interrupted 3-0 Vicryl. Skin was closed with 4-0 Monocryl subcuticular followed by liquid band. All counts were correct. Patient tolerated procedure well apparent complication. Right testicle was returned to anatomic position in the scrotum. He was taken recovery in stable condition. PATIENT DISPOSITION:  PACU - hemodynamically stable.   Delay start of Pharmacological VTE agent (>24hrs) due to surgical blood loss or risk of bleeding:  no  Georganna Skeans, MD, MPH, FACS Pager: [phone]  9/6/20168:44 AM

## 2015-06-03 NOTE — Anesthesia Preprocedure Evaluation (Signed)
Anesthesia Evaluation  Patient identified by MRN, date of birth, ID band Patient awake    Reviewed: Allergy & Precautions, NPO status , Patient's Chart, lab work & pertinent test results  Airway Mallampati: II  TM Distance: >3 FB Neck ROM: Full    Dental   Pulmonary sleep apnea ,  breath sounds clear to auscultation        Cardiovascular hypertension, + CAD Rhythm:Regular Rate:Normal     Neuro/Psych    GI/Hepatic negative GI ROS, Neg liver ROS,   Endo/Other  negative endocrine ROS  Renal/GU negative Renal ROS     Musculoskeletal   Abdominal   Peds  Hematology   Anesthesia Other Findings   Reproductive/Obstetrics                             Anesthesia Physical Anesthesia Plan  ASA: III  Anesthesia Plan: General   Post-op Pain Management:    Induction: Intravenous  Airway Management Planned: LMA  Additional Equipment:   Intra-op Plan:   Post-operative Plan: Extubation in OR  Informed Consent: I have reviewed the patients History and Physical, chart, labs and discussed the procedure including the risks, benefits and alternatives for the proposed anesthesia with the patient or authorized representative who has indicated his/her understanding and acceptance.   Dental advisory given  Plan Discussed with: CRNA and Anesthesiologist  Anesthesia Plan Comments:         Anesthesia Quick Evaluation

## 2015-06-03 NOTE — Progress Notes (Signed)
Dr. Ola Spurr, R notified of PO coffee intake.

## 2015-06-03 NOTE — Progress Notes (Signed)
Client without complaints of pain or nausea; was able to void

## 2015-06-03 NOTE — H&P (Signed)
History of Present Illness Robert Neri E. Grandville Silos MD; 05/07/2015 11:28 AM) Patient words: f/u.  The patient is a 69 year old male who presents for an evaluation of a hernia. [name] underwent partial colectomy for a colon cancer in 2005 by Dr. Hassell Done. He completed chemotherapy. Recently he has developed a right groin bulge which has become more uncomfortable. Additionally, he was doing some exercises and felt some pain on the right side of his abdomen near a prior incision scar. He saw his primary care physician Dr. [name] who asked me to see him in consultation regarding possible incisional hernia. He has not had any change in GI habits. He frequently exercises a lot and has some questions regarding continuation of that.   Other Problems Yehuda Mao, RMA; 05/07/2015 11:11 AM) Anxiety Disorder Colon Cancer High blood pressure Hypercholesterolemia Other disease, cancer, significant illness Sleep Apnea  Past Surgical History Yehuda Mao, RMA; 05/07/2015 11:11 AM) Appendectomy Cataract Surgery Bilateral. Colon Polyp Removal - Colonoscopy Colon Removal - Partial Knee Surgery Left. Resection of Small Bowel TURP Vasectomy  Diagnostic Studies History Yehuda Mao, RMA; 05/07/2015 11:11 AM) Colonoscopy 1-5 years ago  Allergies Shirlean Mylar Gwynn, RMA; 05/07/2015 11:12 AM) Betadine *ANTISEPTICS & DISINFECTANTS*  Medication History Shirlean Mylar Gwynn, RMA; 05/07/2015 11:15 AM) LORazepam (1MG  Tablet, Oral) Active. Cialis (5MG  Tablet, Oral) Active. Crestor (10MG  Tablet, Oral) Active. Diltiazem HCl ER Coated Beads (180MG  Capsule ER 24HR, Oral) Active. Lisinopril-Hydrochlorothiazide (20-12.5MG  Tablet, Oral) Active. Metoprolol Succinate ER (25MG  Tablet ER 24HR, Oral) Active. B Complex Formula 1 (Oral) Active. Fish Oil (1000MG  Capsule DR, Oral) Active. Multi-Day (Oral) Active. Medications Reconciled  Social History Yehuda Mao, RMA; 05/07/2015 11:11 AM) Alcohol use Moderate  alcohol use. Caffeine use Carbonated beverages, Coffee. No drug use Tobacco use Never smoker.  Family History Yehuda Mao, RMA; 05/07/2015 11:11 AM) Heart Disease Mother. Hypertension Father.  Vitals (Robin Gwynn RMA; 05/07/2015 11:16 AM) 05/07/2015 11:15 AM Weight: 167 lb Height: 69in Body Surface Area: 1.92 m Body Mass Index: 24.66 kg/m Temp.: 98.36F  Pulse: 60 (Regular)  BP: 118/70 (Sitting, Left Arm, Standard)    Physical Exam Robert Neri E. Grandville Silos MD; 05/07/2015 11:30 AM) General Mental Status-Alert. General Appearance-Consistent with stated age. Hydration-Well hydrated. Voice-Normal.  Head and Neck Head-normocephalic, atraumatic with no lesions or palpable masses. Trachea-midline.  Eye Eyeball - Bilateral-Extraocular movements intact. Sclera/Conjunctiva - Bilateral-No scleral icterus.  Chest and Lung Exam Chest and lung exam reveals -quiet, even and easy respiratory effort with no use of accessory muscles and on auscultation, normal breath sounds, no adventitious sounds and normal vocal resonance. Inspection Chest Wall - Normal. Back - normal.  Cardiovascular Cardiovascular examination reveals -normal heart sounds, regular rate and rhythm with no murmurs and normal pedal pulses bilaterally.  Abdomen Inspection Skin - Scar - Periumbilical and Right Upper Quadrant. Hernias: Hernias - Ventral - Note: No fascia defect palpated beneath right-sided abdominal scar or lower midline and periumbilical scar. There is mild discomfort on palpation beneath the right-sided abdominal scar. Inguinal hernia - Right - Reducible. Palpation/Percussion Palpation and Percussion of the abdomen reveal - Soft, Non Tender, No Rebound tenderness, No Rigidity (guarding) and No hepatosplenomegaly. Auscultation Auscultation of the abdomen reveals - Bowel sounds normal.  Neurologic Neurologic evaluation reveals -alert and oriented x 3 with no impairment of  recent or remote memory. Mental Status-Normal.  Musculoskeletal Normal Exam - Left-Upper Extremity Strength Normal and Lower Extremity Strength Normal. Normal Exam - Right-Upper Extremity Strength Normal and Lower Extremity Strength Normal.    Assessment & Plan Robert Neri E.  Grandville Silos MD; 05/07/2015 11:34 AM) INGUINAL HERNIA OF RIGHT SIDE WITHOUT OBSTRUCTION OR GANGRENE (550.90  K40.90) Impression: I recommend repair. I gave him an information booklet and we described the procedure, risks, and benefits. Current Plans  Pt Education - [facility] Umbilical/ Inguinal Hernia HCI INCISIONAL HERNIA, WITHOUT OBSTRUCTION OR GANGRENE (553.21  K43.2) Impression: I do not feel an obvious fascial defect. In light of his symptoms, which are suspicious, I want to evaluate things further with a CT scan of the abdomen and pelvis. This is additionally important with his history of colon cancer. Once I review his CT, I will give him a call and we will make further surgical plans. In the interim I advised him that he may continue walking and using the elliptical machine but avoid weight machines and sit ups.  Update: CT shows no incisional hernia. BIH with fat only. I spoke with him at length and decision was made to proceed with [facility] repair with mesh. L side asymptomatic and not felt on exam. Procedure/risks/benefits discussed and he agreed.  Georganna Skeans, MD, MPH, FACS Trauma: [phone] General Surgery: [phone]

## 2015-06-03 NOTE — Progress Notes (Signed)
Patient ID: Robert [name], male   DOB: 1946/01/18, 69 y.o.   MRN: [id number] D/W Dr. Oletta Lamas from anesthesia. He did very well and after observation should be able to go home instead of staying overnight as originally planned with HX OSA. He is strictly compliant with his CPAP. Georganna Skeans, MD, MPH, FACS Trauma: [phone] General Surgery: [phone]

## 2015-06-04 ENCOUNTER — Encounter (HOSPITAL_COMMUNITY): Payer: Self-pay | Admitting: General Surgery

## 2015-06-04 NOTE — Anesthesia Postprocedure Evaluation (Signed)
  Anesthesia Post-op Note  Patient: Robert [name]  Procedure(s) Performed: Procedure(s): REPAIR RIGHT INGUINAL HERNIA (Right) INSERTION OF MESH (Right)  Patient Location: PACU  Anesthesia Type:General  Level of Consciousness: awake  Airway and Oxygen Therapy: Patient Spontanous Breathing  Post-op Pain: mild  Post-op Assessment: Post-op Vital signs reviewed              Post-op Vital Signs: Reviewed  Last Vitals:  Filed Vitals:   06/03/15 1106  BP: 123/69  Pulse: 70  Temp:   Resp:     Complications: No apparent anesthesia complications

## 2015-07-17 ENCOUNTER — Other Ambulatory Visit: Payer: Self-pay | Admitting: Cardiology

## 2015-07-17 MED ORDER — DILTIAZEM HCL ER COATED BEADS 180 MG PO CP24: 180.0000 mg | ORAL_CAPSULE | Freq: Every day | ORAL | Status: DC

## 2015-08-05 ENCOUNTER — Other Ambulatory Visit: Payer: Self-pay | Admitting: Cardiology

## 2015-08-05 MED ORDER — METOPROLOL SUCCINATE ER 25 MG PO TB24: 25.0000 mg | ORAL_TABLET | Freq: Two times a day (BID) | ORAL | Status: DC

## 2015-08-06 ENCOUNTER — Other Ambulatory Visit: Payer: Self-pay

## 2015-08-06 MED ORDER — ROSUVASTATIN CALCIUM 10 MG PO TABS: 10.0000 mg | ORAL_TABLET | Freq: Every evening | ORAL | Status: DC

## 2015-08-07 ENCOUNTER — Encounter: Payer: Self-pay | Admitting: Cardiology

## 2015-08-07 ENCOUNTER — Encounter: Payer: Self-pay | Admitting: Neurology

## 2015-08-08 MED ORDER — METOPROLOL SUCCINATE ER 25 MG PO TB24: 25.0000 mg | ORAL_TABLET | Freq: Two times a day (BID) | ORAL | Status: DC

## 2015-08-08 NOTE — Telephone Encounter (Signed)
Spoke to patient he stated medication refills already sent to pharmacy.Stated he is scheduled for rt knee replacement 10/03/15.Follow up appointment scheduled with Dr.Jordan 12/17/15 at 8:00 am.

## 2015-09-05 ENCOUNTER — Ambulatory Visit (INDEPENDENT_AMBULATORY_CARE_PROVIDER_SITE_OTHER): Payer: BLUE CROSS/BLUE SHIELD | Admitting: Internal Medicine

## 2015-09-05 DIAGNOSIS — Z7189 Other specified counseling: Secondary | ICD-10-CM | POA: Diagnosis not present

## 2015-09-05 DIAGNOSIS — Z789 Other specified health status: Secondary | ICD-10-CM | POA: Diagnosis not present

## 2015-09-05 DIAGNOSIS — Z23 Encounter for immunization: Secondary | ICD-10-CM

## 2015-09-05 DIAGNOSIS — Z9189 Other specified personal risk factors, not elsewhere classified: Secondary | ICD-10-CM

## 2015-09-05 DIAGNOSIS — Z298 Encounter for other specified prophylactic measures: Secondary | ICD-10-CM

## 2015-09-05 DIAGNOSIS — Z418 Encounter for other procedures for purposes other than remedying health state: Secondary | ICD-10-CM

## 2015-09-05 DIAGNOSIS — IMO0002 Reserved for concepts with insufficient information to code with codable children: Secondary | ICD-10-CM

## 2015-09-05 MED ORDER — CIPROFLOXACIN HCL 500 MG PO TABS: 500.0000 mg | ORAL_TABLET | Freq: Two times a day (BID) | ORAL | Status: DC

## 2015-09-05 MED ORDER — TYPHOID VACCINE PO CPDR: 1.0000 | DELAYED_RELEASE_CAPSULE | ORAL | Status: DC

## 2015-09-05 MED ORDER — ACETAZOLAMIDE 125 MG PO TABS: 125.0000 mg | ORAL_TABLET | Freq: Two times a day (BID) | ORAL | Status: DC

## 2015-09-05 MED ORDER — ATOVAQUONE-PROGUANIL HCL 250-100 MG PO TABS: 1.0000 | ORAL_TABLET | Freq: Every day | ORAL | Status: DC

## 2015-09-05 NOTE — Progress Notes (Signed)
RFV: pre travel counseling for trip to [place] Subjective:    Patient ID: Robert [name], male    DOB: January 20, 1946, 69 y.o.   MRN: [id number]  HPI 69yo M will be going on group trip with his wife to [place] and [place] from 01/17/2016 thru 5/52017. He has question to whether he needs additional battery pack for his cpap machine when he goes for the 3 nights to [place]. He states that he does occasionally forget to take it for long weekends in the mountains and has no difficulty. When he does not wear cpap, he sleeps on his side so that he has fewer apneic episodes. He has questions on how to prevent altitude sickness  He has had the flu vaccine this year  Previous travel includes: [place], [place], [place], [place], [place], [place], and [place] Allergies  Allergen Reactions  . Betadine [Povidone Iodine] Other (See Comments)    Causes burning to skin    Current Outpatient Prescriptions on File Prior to Visit  Medication Sig Dispense Refill  . aspirin 81 MG tablet Take 81 mg by mouth daily.    Marland Kitchen b complex vitamins tablet Take 1 tablet by mouth daily.    Marland Kitchen CIALIS 5 MG tablet Take 5 mg by mouth daily at 6 PM.     . diltiazem (CARDIZEM CD) 180 MG 24 hr capsule Take 1 capsule (180 mg total) by mouth daily. 90 capsule 2  . diphenhydrAMINE (BENYLIN) 12.5 MG/5ML syrup Take 25 mg by mouth at bedtime as needed for sleep.    Marland Kitchen HYDROcodone-acetaminophen (NORCO/VICODIN) 5-325 MG per tablet Take 1-2 tablets by mouth every 4 (four) hours as needed for moderate pain or severe pain. 50 tablet 0  . lisinopril-hydrochlorothiazide (PRINZIDE,ZESTORETIC) 20-12.5 MG per tablet Take 1 tablet by mouth every morning. 45 tablet 6  . LORazepam (ATIVAN) 0.5 MG tablet Take 0.5 mg by mouth daily as needed. When heart races    . metoprolol succinate (TOPROL-XL) 25 MG 24 hr tablet Take 1 tablet (25 mg total) by mouth 2 (two) times daily. 180 tablet 2  . Multiple Vitamin (MULTIVITAMIN) tablet Take 1 tablet by mouth  daily.    . NON FORMULARY CPAP MACHINE    . Omega-3 Fatty Acids (FISH OIL) 1200 MG CAPS Take 1 capsule by mouth daily.    . rosuvastatin (CRESTOR) 10 MG tablet Take 1 tablet (10 mg total) by mouth every evening. 90 tablet 3  . [DISCONTINUED] hyoscyamine (LEVSIN SL) 0.125 MG SL tablet      No current facility-administered medications on file prior to visit.   Active Ambulatory Problems    Diagnosis Date Noted  . Hypertension   . Hyperlipidemia   . Tachycardia 12/08/2011  . Coronary artery calcification seen on CAT scan 05/25/2012  . Sleep apnea with use of continuous positive airway pressure (CPAP)   . Nocturia associated with benign prostatic hypertrophy 03/21/2013  . Palpitations 10/23/2013  . OSA on CPAP 03/20/2014  . OA (osteoarthritis) of knee 01/06/2015  . Nocturia more than twice per night 04/24/2015   Resolved Ambulatory Problems    Diagnosis Date Noted  . No Resolved Ambulatory Problems   Past Medical History  Diagnosis Date  . S/P cardiac catheterization 1995 and 1998  . Anxiety   . Neuropathy   . Edema of right foot   . Complication of anesthesia   . History of kidney stones   . Arthritis   . Colon cancer   - he has a daughter in medical  school, first year at [employer], [place]. interested in onc. Social History  Substance Use Topics  . Smoking status: Never Smoker   . Smokeless tobacco: Never Used  . Alcohol Use: 10.2 oz/week    8 Glasses of wine, 8 Shots of liquor, 1 Standard drinks or equivalent per week     Comment: each night  family history includes Emphysema in his mother; Heart attack in his father; High Cholesterol in his other; Hypertension in his father and other.   Review of Systems     Objective:   Physical Exam        Assessment & Plan:  Pre travel counseling = will have him receive yellow fever, hep a today. Plan to get typhoid injection in the Spring, 2 wk prior to trip.   Malaria prophylaxis = gave precautions and malarone for time  needed during his trip portion going to [place].  Traveler's diarrhea = gave rx for traveler's diarrhea with instructions how/when to take medication

## 2015-09-19 ENCOUNTER — Ambulatory Visit: Payer: Self-pay | Admitting: Orthopedic Surgery

## 2015-09-19 NOTE — Progress Notes (Signed)
Preoperative surgical orders have been place into the Epic hospital system for Robert [name] on 09/19/2015, 1:26 PM  by Mickel Crow for surgery on 10-03-15.  Preop Total Knee orders including Experal, IV Tylenol, and IV Decadron as long as there are no contraindications to the above medications. Arlee Muslim, PA-C

## 2015-09-23 ENCOUNTER — Other Ambulatory Visit (HOSPITAL_COMMUNITY): Payer: Self-pay | Admitting: *Deleted

## 2015-09-23 NOTE — Progress Notes (Signed)
Medical clearance dr Darol Destine no chart and cardiac clearance note dr Martinique on chart for 10-03-15 surgery

## 2015-09-23 NOTE — Patient Instructions (Addendum)
Robert [name]  09/23/2015   Your procedure is scheduled on: 10-03-15  Report to [facility] Main  Entrance take [facility]  elevators to 3rd floor to  [place] at 915 AM.  Call this number if you have problems the morning of surgery [phone]   Remember: ONLY 1 PERSON MAY GO WITH YOU TO SHORT STAY TO GET  READY MORNING OF [place].  Do not eat food or drink liquids :After Midnight.   Bring cpap mask and tubing  Take these medicines the morning of surgery with A SIP OF WATER: metoprolol succinate (Toprol)                               You may not have any metal on your body including hair pins and              piercings  Do not wear jewelry, make-up, lotions, powders or perfumes, deodorant             Do not wear nail polish.  Do not shave  48 hours prior to surgery.              Men may shave face and neck.   Do not bring valuables to the hospital. [place].  Contacts, dentures or bridgework may not be worn into surgery.  Leave suitcase in the car. After surgery it may be brought to your room.     Patients discharged the day of surgery will not be allowed to drive home.  Name and phone number of your driver:  Special Instructions: N/A              Please read over the following fact sheets you were given: _____________________________________________________________________             [facility] - Preparing for Surgery Before surgery, you can play an important role.  Because skin is not sterile, your skin needs to be as free of germs as possible.  You can reduce the number of germs on your skin by washing with CHG (chlorahexidine gluconate) soap before surgery.  CHG is an antiseptic cleaner which kills germs and bonds with the skin to continue killing germs even after washing. Please DO NOT use if you have an allergy to CHG or antibacterial soaps.  If your skin becomes reddened/irritated stop  using the CHG and inform your nurse when you arrive at Short Stay. Do not shave (including legs and underarms) for at least 48 hours prior to the first CHG shower.  You may shave your face/neck. Please follow these instructions carefully:  1.  Shower with CHG Soap the night before surgery and the  morning of Surgery.  2.  If you choose to wash your hair, wash your hair first as usual with your  normal  shampoo.  3.  After you shampoo, rinse your hair and body thoroughly to remove the  shampoo.                           4.  Use CHG as you would any other liquid soap.  You can apply chg directly  to the skin and wash  Gently with a scrungie or clean washcloth.  5.  Apply the CHG Soap to your body ONLY FROM THE NECK DOWN.   Do not use on face/ open                           Wound or open sores. Avoid contact with eyes, ears mouth and genitals (private parts).                       Wash face,  Genitals (private parts) with your normal soap.             6.  Wash thoroughly, paying special attention to the area where your surgery  will be performed.  7.  Thoroughly rinse your body with warm water from the neck down.  8.  DO NOT shower/wash with your normal soap after using and rinsing off  the CHG Soap.                9.  Pat yourself dry with a clean towel.            10.  Wear clean pajamas.            11.  Place clean sheets on your bed the night of your first shower and do not  sleep with pets. Day of Surgery : Do not apply any lotions/deodorants the morning of surgery.  Please wear clean clothes to the hospital/surgery center.  FAILURE TO FOLLOW THESE INSTRUCTIONS MAY RESULT IN THE CANCELLATION OF YOUR SURGERY PATIENT SIGNATURE_________________________________  NURSE SIGNATURE__________________________________  ________________________________________________________________________   Robert [name]  An incentive spirometer is a tool that can help keep your  lungs clear and active. This tool measures how well you are filling your lungs with each breath. Taking long deep breaths may help reverse or decrease the chance of developing breathing (pulmonary) problems (especially infection) following:  A long period of time when you are unable to move or be active. BEFORE THE PROCEDURE   If the spirometer includes an indicator to show your best effort, your nurse or respiratory therapist will set it to a desired goal.  If possible, sit up straight or lean slightly forward. Try not to slouch.  Hold the incentive spirometer in an upright position. INSTRUCTIONS FOR USE   Sit on the edge of your bed if possible, or sit up as far as you can in bed or on a chair.  Hold the incentive spirometer in an upright position.  Breathe out normally.  Place the mouthpiece in your mouth and seal your lips tightly around it.  Breathe in slowly and as deeply as possible, raising the piston or the ball toward the top of the column.  Hold your breath for 3-5 seconds or for as long as possible. Allow the piston or ball to fall to the bottom of the column.  Remove the mouthpiece from your mouth and breathe out normally.  Rest for a few seconds and repeat Steps 1 through 7 at least 10 times every 1-2 hours when you are awake. Take your time and take a few normal breaths between deep breaths.  The spirometer may include an indicator to show your best effort. Use the indicator as a goal to work toward during each repetition.  After each set of 10 deep breaths, practice coughing to be sure your lungs are clear. If you have an incision (the cut made at the time of surgery),  support your incision when coughing by placing a pillow or rolled up towels firmly against it. Once you are able to get out of bed, walk around indoors and cough well. You may stop using the incentive spirometer when instructed by your caregiver.  RISKS AND COMPLICATIONS  Take your time so you do not  get dizzy or light-headed.  If you are in pain, you may need to take or ask for pain medication before doing incentive spirometry. It is harder to take a deep breath if you are having pain. AFTER USE  Rest and breathe slowly and easily.  It can be helpful to keep track of a log of your progress. Your caregiver can provide you with a simple table to help with this. If you are using the spirometer at home, follow these instructions: [place] IF:   You are having difficultly using the spirometer.  You have trouble using the spirometer as often as instructed.  Your pain medication is not giving enough relief while using the spirometer.  You develop fever of 100.5 F (38.1 C) or higher. SEEK IMMEDIATE MEDICAL CARE IF:   You cough up bloody sputum that had not been present before.  You develop fever of 102 F (38.9 C) or greater.  You develop worsening pain at or near the incision site. MAKE SURE YOU:   Understand these instructions.  Will watch your condition.  Will get help right away if you are not doing well or get worse. Document Released: 01/24/2007 Document Revised: 12/06/2011 Document Reviewed: 03/27/2007 ExitCare Patient Information 2014 ExitCare, [place].   ________________________________________________________________________  WHAT IS A BLOOD TRANSFUSION? Blood Transfusion Information  A transfusion is the replacement of blood or some of its parts. Blood is made up of multiple cells which provide different functions.  Red blood cells carry oxygen and are used for blood loss replacement.  White blood cells fight against infection.  Platelets control bleeding.  Plasma helps clot blood.  Other blood products are available for specialized needs, such as hemophilia or other clotting disorders. BEFORE THE TRANSFUSION  Who gives blood for transfusions?   Healthy volunteers who are fully evaluated to make sure their blood is safe. This is blood bank  blood. Transfusion therapy is the safest it has ever been in the practice of medicine. Before blood is taken from a donor, a complete history is taken to make sure that person has no history of diseases nor engages in risky social behavior (examples are intravenous drug use or sexual activity with multiple partners). The donor's travel history is screened to minimize risk of transmitting infections, such as malaria. The donated blood is tested for signs of infectious diseases, such as HIV and hepatitis. The blood is then tested to be sure it is compatible with you in order to minimize the chance of a transfusion reaction. If you or a relative donates blood, this is often done in anticipation of surgery and is not appropriate for emergency situations. It takes many days to process the donated blood. RISKS AND COMPLICATIONS Although transfusion therapy is very safe and saves many lives, the main dangers of transfusion include:   Getting an infectious disease.  Developing a transfusion reaction. This is an allergic reaction to something in the blood you were given. Every precaution is taken to prevent this. The decision to have a blood transfusion has been considered carefully by your caregiver before blood is given. Blood is not given unless the benefits outweigh the risks. AFTER THE TRANSFUSION  Right after receiving a blood transfusion, you will usually feel much better and more energetic. This is especially true if your red blood cells have gotten low (anemic). The transfusion raises the level of the red blood cells which carry oxygen, and this usually causes an energy increase.  The nurse administering the transfusion will monitor you carefully for complications. HOME CARE INSTRUCTIONS  No special instructions are needed after a transfusion. You may find your energy is better. Speak with your caregiver about any limitations on activity for underlying diseases you may have. SEEK MEDICAL CARE IF:    Your condition is not improving after your transfusion.  You develop redness or irritation at the intravenous (IV) site. SEEK IMMEDIATE MEDICAL CARE IF:  Any of the following symptoms occur over the next 12 hours:  Shaking chills.  You have a temperature by mouth above 102 F (38.9 C), not controlled by medicine.  Chest, back, or muscle pain.  People around you feel you are not acting correctly or are confused.  Shortness of breath or difficulty breathing.  Dizziness and fainting.  You get a rash or develop hives.  You have a decrease in urine output.  Your urine turns a dark color or changes to pink, red, or brown. Any of the following symptoms occur over the next 10 days:  You have a temperature by mouth above 102 F (38.9 C), not controlled by medicine.  Shortness of breath.  Weakness after normal activity.  The white part of the eye turns yellow (jaundice).  You have a decrease in the amount of urine or are urinating less often.  Your urine turns a dark color or changes to pink, red, or brown. Document Released: 09/10/2000 Document Revised: 12/06/2011 Document Reviewed: 04/29/2008 [facility] Patient Information 2014 [place], [place].  _______________________________________________________________________

## 2015-09-25 ENCOUNTER — Encounter (HOSPITAL_COMMUNITY)
Admission: RE | Admit: 2015-09-25 | Discharge: 2015-09-25 | Disposition: A | Discharge status: Home / Self Care | Payer: BLUE CROSS/BLUE SHIELD | Source: Ambulatory Visit | Attending: Orthopedic Surgery | Admitting: Orthopedic Surgery

## 2015-09-25 ENCOUNTER — Encounter (HOSPITAL_COMMUNITY): Payer: Self-pay

## 2015-09-25 DIAGNOSIS — Z01810 Encounter for preprocedural cardiovascular examination: Secondary | ICD-10-CM | POA: Insufficient documentation

## 2015-09-25 DIAGNOSIS — M1711 Unilateral primary osteoarthritis, right knee: Secondary | ICD-10-CM | POA: Insufficient documentation

## 2015-09-25 DIAGNOSIS — Z01812 Encounter for preprocedural laboratory examination: Secondary | ICD-10-CM | POA: Diagnosis not present

## 2015-09-25 LAB — TYPE AND SCREEN
ABO/RH(D): O POS
ANTIBODY SCREEN: NEGATIVE

## 2015-09-25 LAB — SURGICAL PCR SCREEN
MRSA, PCR: NEGATIVE
STAPHYLOCOCCUS AUREUS: NEGATIVE

## 2015-09-25 LAB — CBC
HEMATOCRIT: 44 % (ref 39.0–52.0)
Hemoglobin: 14.4 g/dL (ref 13.0–17.0)
MCH: 28.9 pg (ref 26.0–34.0)
MCHC: 32.7 g/dL (ref 30.0–36.0)
MCV: 88.2 fL (ref 78.0–100.0)
Platelets: 185 10*3/uL (ref 150–400)
RBC: 4.99 MIL/uL (ref 4.22–5.81)
RDW: 13.2 % (ref 11.5–15.5)
WBC: 5 10*3/uL (ref 4.0–10.5)

## 2015-09-25 LAB — URINALYSIS, ROUTINE W REFLEX MICROSCOPIC
BILIRUBIN URINE: NEGATIVE
Glucose, UA: NEGATIVE mg/dL
Hgb urine dipstick: NEGATIVE
Ketones, ur: NEGATIVE mg/dL
Leukocytes, UA: NEGATIVE
NITRITE: NEGATIVE
PH: 6 (ref 5.0–8.0)
Protein, ur: NEGATIVE mg/dL
SPECIFIC GRAVITY, URINE: 1.019 (ref 1.005–1.030)

## 2015-09-25 LAB — APTT: aPTT: 29 seconds (ref 24–37)

## 2015-09-25 LAB — COMPREHENSIVE METABOLIC PANEL
ALT: 31 U/L (ref 17–63)
ANION GAP: 8 (ref 5–15)
AST: 26 U/L (ref 15–41)
Albumin: 4.1 g/dL (ref 3.5–5.0)
Alkaline Phosphatase: 89 U/L (ref 38–126)
BILIRUBIN TOTAL: 0.6 mg/dL (ref 0.3–1.2)
BUN: 29 mg/dL — AB (ref 6–20)
CALCIUM: 10.4 mg/dL — AB (ref 8.9–10.3)
CO2: 26 mmol/L (ref 22–32)
Chloride: 104 mmol/L (ref 101–111)
Creatinine, Ser: 0.95 mg/dL (ref 0.61–1.24)
GFR calc Af Amer: >60 mL/min (ref >60)
Glucose, Bld: 85 mg/dL (ref 65–99)
POTASSIUM: 4.3 mmol/L (ref 3.5–5.1)
Sodium: 138 mmol/L (ref 135–145)
TOTAL PROTEIN: 6.7 g/dL (ref 6.5–8.1)

## 2015-09-25 LAB — PROTIME-INR
INR: 0.99 (ref 0.00–1.49)
PROTHROMBIN TIME: 13.3 s (ref 11.6–15.2)

## 2015-10-01 NOTE — H&P (Signed)
TOTAL KNEE ADMISSION H&P  Patient is being admitted for right total knee arthroplasty.  Subjective:  Chief Complaint:right knee pain.  HPI: Robert [name], 70 y.o. male, has a history of pain and functional disability in the right knee due to arthritis and has failed non-surgical conservative treatments for greater than 12 weeks to includeNSAID's and/or analgesics, corticosteriod injections, viscosupplementation injections, flexibility and strengthening excercises and activity modification.  Onset of symptoms was gradual, starting 5 years ago with gradually worsening course since that time. The patient noted no past surgery on the right knee(s).  Patient currently rates pain in the right knee(s) at 7 out of 10 with activity. Patient has night pain, worsening of pain with activity and weight bearing, pain that interferes with activities of daily living, pain with passive range of motion, crepitus and joint swelling.  Patient has evidence of periarticular osteophytes, joint subluxation and joint space narrowing by imaging studies.  There is no active infection.  Patient Active Problem List   Diagnosis Date Noted  . Nocturia more than twice per night 04/24/2015  . OA (osteoarthritis) of knee 01/06/2015  . OSA on CPAP 03/20/2014  . Palpitations 10/23/2013  . Nocturia associated with benign prostatic hypertrophy 03/21/2013  . Sleep apnea with use of continuous positive airway pressure (CPAP)   . Coronary artery calcification seen on CAT scan 05/25/2012  . Tachycardia 12/08/2011  . Hypertension   . Hyperlipidemia    Past Medical History  Diagnosis Date  . Hypertension   . Hyperlipidemia   . S/P cardiac catheterization 1995 and 1998    a. following abnormal stress tests. Both reportedly normal;  b. 02/2013 nl myoview.  . Anxiety   . 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 Nocturia associated with benign prostatic hypertrophy 03/21/2013  . Palpitations   . Edema of  right foot     ? muscle problem to be addressed after left knee TKA on 01/06/2015 per patient   . History of kidney stones   . Arthritis     Knee , Neck  . Colon cancer [facility])     a. s/p colon resection in 2004 and 2002  . Complication of anesthesia   . Sleep apnea with use of continuous positive airway pressure (CPAP)     cpap- 4 setting , temperature setting of 72  humidity only wears 4 hours per patient    Past Surgical History  Procedure Laterality Date  . Cardiac catheterization  07/04/1997  . Colon resection  2004    x 2 per patient   . L4 discectomy    . Elbow surgery Left 2011    INFECTED OLECRANON BURSITIS SURGERY  . Transurethral resection of prostate    . Retinal detachment surgery  1998  . Septic joint Left     Elbow  . Total knee arthroplasty Left 01/06/2015    Procedure: LEFT TOTAL KNEE ARTHROPLASTY;  Surgeon: Gaynelle Arabian, MD;  Location: [facility];  Service: Orthopedics;  Laterality: Left;  . Colonoscopy    . Inguinal hernia repair Right 06/03/2015    Procedure: REPAIR RIGHT INGUINAL HERNIA;  Surgeon: Georganna Skeans, MD;  Location: [place];  Service: General;  Laterality: Right;  . Insertion of mesh Right 06/03/2015    Procedure: INSERTION OF MESH;  Surgeon: Georganna Skeans, MD;  Location: [place];  Service: General;  Laterality: Right;   Current outpatient prescriptions:  .  aspirin 81 MG tablet, Take 81 mg by mouth daily., Disp: ,  Rfl:  .  b complex vitamins tablet, Take 0.5 tablets by mouth 2 (two) times daily. , Disp: , Rfl:  .  CIALIS 5 MG tablet, Take 5 mg by mouth every evening. , Disp: , Rfl:  .  diltiazem (CARDIZEM CD) 180 MG 24 hr capsule, Take 1 capsule (180 mg total) by mouth daily. (Patient taking differently: Take 180 mg by mouth at bedtime. ), Disp: 90 capsule, Rfl: 2 .  diphenhydrAMINE (BENYLIN) 12.5 MG/5ML syrup, Take 12.5 mg by mouth at bedtime as needed for sleep. , Disp: , Rfl:  .  KERYDIN 5 % SOLN, Apply 1 application topically daily. Apply to toes, Disp: ,  Rfl:  .  lisinopril-hydrochlorothiazide (PRINZIDE,ZESTORETIC) 20-12.5 MG per tablet, Take 1 tablet by mouth every morning., Disp: 45 tablet, Rfl: 6 .  LORazepam (ATIVAN) 0.5 MG tablet, Take 0.5 mg by mouth daily as needed for anxiety. When heart races, Disp: , Rfl:  .  metoprolol succinate (TOPROL-XL) 25 MG 24 hr tablet, Take 1 tablet (25 mg total) by mouth 2 (two) times daily., Disp: 180 tablet, Rfl: 2 .  Multiple Vitamin (MULTIVITAMIN) tablet, Take 1 tablet by mouth daily., Disp: , Rfl:  .  Omega-3 Fatty Acids (FISH OIL PO), Take 1 capsule by mouth daily., Disp: , Rfl:  .  Probiotic Product (PROBIOTIC DAILY PO), Take 1 capsule by mouth daily., Disp: , Rfl:  .  rosuvastatin (CRESTOR) 10 MG tablet, Take 1 tablet (10 mg total) by mouth every evening., Disp: 90 tablet, Rfl: 3 .  acetaZOLAMIDE (DIAMOX) 125 MG tablet, Take 1 tablet (125 mg total) by mouth 2 (two) times daily. Start day before going to high alt. Take twice/day x 2-3 days. At max altitude, Disp: 10 tablet, Rfl: 0 .  amoxicillin (AMOXIL) 500 MG capsule, TAKE 4 CAPSULES 1 HOUR BEFORE DENTAL APPOINTMENT, Disp: , Rfl: 2 .  atovaquone-proguanil (MALARONE) 250-100 MG TABS tablet, Take 1 tablet by mouth daily. Start 2 days prior to going to [place]. Take daily on full stomach until complete, Disp: 12 tablet, Rfl: 0 .  ciprofloxacin (CIPRO) 500 MG tablet, Take 1 tablet (500 mg total) by mouth 2 (two) times daily. If needed for 3+ loose stools/24hr. Can stop taking if diarrhea resolves, Disp: 10 tablet, Rfl: 0 .  NON FORMULARY, CPAP MACHINE, Disp: , Rfl:  .  typhoid (VIVOTIF) DR capsule, Take 1 capsule by mouth every other day. Keep refrigerated. Take on empty stomach, Disp: 4 capsule, Rfl: 0  Allergies  Allergen Reactions  . Betadine [Povidone Iodine] Other (See Comments)    Causes stinging of skin     Social History  Substance Use Topics  . Smoking status: Never Smoker   . Smokeless tobacco: Never Used  . Alcohol Use: 10.2 oz/week    8  Glasses of wine, 8 Shots of liquor, 1 Standard drinks or equivalent per week     Comment: each night 1 glass    Family History  Problem Relation Age of Onset  . Emphysema Mother   . Heart attack Father   . Hypertension Father   . Hypertension Other   . High Cholesterol Other      Review of Systems  Constitutional: Negative.   HENT: Negative.   Eyes: Negative.   Respiratory: Negative.   Cardiovascular: Negative.   Gastrointestinal: Negative.   Genitourinary: Positive for frequency. Negative for dysuria, urgency, hematuria and flank pain.  Musculoskeletal: Positive for joint pain. Negative for myalgias, back pain, falls and neck pain.  Right knee pain  Skin: Negative.   Neurological: Negative.   Endo/Heme/Allergies: Negative.   Psychiatric/Behavioral: Negative.     Objective:  Physical Exam  Constitutional: He is oriented to person, place, and time. He appears well-developed and well-nourished. No distress.  HENT:  Head: Normocephalic and atraumatic.  Right Ear: External ear normal.  Left Ear: External ear normal.  Nose: Nose normal.  Mouth/Throat: Oropharynx is clear and moist.  Eyes: Conjunctivae and EOM are normal.  Neck: Normal range of motion. Neck supple.  Cardiovascular: Normal rate, normal heart sounds and intact distal pulses.   No murmur heard. Respiratory: Effort normal and breath sounds normal. No respiratory distress. He has no wheezes.  GI: Soft. Bowel sounds are normal. He exhibits no distension. There is no tenderness.  Musculoskeletal:       Right hip: Normal.       Left hip: Normal.  His left knee shows no swelling. Range 0 to 132. There is no tenderness or instability. Right knee significant varus, range 5 to 120. Marked crepitus in range of motion. Tender on the medial greater than lateral, no instability.  Neurological: He is alert and oriented to person, place, and time. He has normal strength and normal reflexes. No sensory deficit.  Skin: No  rash noted. He is not diaphoretic. No erythema.  Psychiatric: He has a normal mood and affect. His behavior is normal.    Vitals  Weight: 165 lb Height: 69in Body Surface Area: 1.9 m Body Mass Index: 24.37 kg/m  Pulse: 64 (Regular)  BP: 112/68 (Sitting, Left Arm, Standard)  Imaging Review Plain radiographs demonstrate severe degenerative joint disease of the right knee(s). The overall alignment ismild varus. The bone quality appears to be good for age and reported activity level.  Assessment/Plan:  End stage primary osteoarthritis, right knee   The patient history, physical examination, clinical judgment of the provider and imaging studies are consistent with end stage degenerative joint disease of the right knee(s) and total knee arthroplasty is deemed medically necessary. The treatment options including medical management, injection therapy arthroscopy and arthroplasty were discussed at length. The risks and benefits of total knee arthroplasty were presented and reviewed. The risks due to aseptic loosening, infection, stiffness, patella tracking problems, thromboembolic complications and other imponderables were discussed. The patient acknowledged the explanation, agreed to proceed with the plan and consent was signed. Patient is being admitted for inpatient treatment for surgery, pain control, PT, OT, prophylactic antibiotics, VTE prophylaxis, progressive ambulation and ADL's and discharge planning. The patient is planning to be discharged home with home health services   Reports that alternating Tramadol and Norco worked well for pain management with left TKA Topical TXA  PCP: Dr. [name] Cardio: Dr. Peter Martinique    Ardeen Jourdain, PA-C

## 2015-10-03 ENCOUNTER — Inpatient Hospital Stay (HOSPITAL_COMMUNITY)
Admission: RE | Admit: 2015-10-03 | Discharge: 2015-10-06 | DRG: 470 | Disposition: A | Discharge status: Home Health | Payer: BLUE CROSS/BLUE SHIELD | Source: Ambulatory Visit | Attending: Orthopedic Surgery | Admitting: Orthopedic Surgery

## 2015-10-03 ENCOUNTER — Inpatient Hospital Stay (HOSPITAL_COMMUNITY): Payer: BLUE CROSS/BLUE SHIELD | Admitting: Certified Registered Nurse Anesthetist

## 2015-10-03 ENCOUNTER — Encounter (HOSPITAL_COMMUNITY): Payer: Self-pay | Admitting: *Deleted

## 2015-10-03 ENCOUNTER — Encounter (HOSPITAL_COMMUNITY)
Admission: RE | Disposition: A | Discharge status: Home Health | Payer: Self-pay | Source: Ambulatory Visit | Attending: Orthopedic Surgery

## 2015-10-03 DIAGNOSIS — Z01812 Encounter for preprocedural laboratory examination: Secondary | ICD-10-CM | POA: Diagnosis not present

## 2015-10-03 DIAGNOSIS — Z9049 Acquired absence of other specified parts of digestive tract: Secondary | ICD-10-CM

## 2015-10-03 DIAGNOSIS — Z8249 Family history of ischemic heart disease and other diseases of the circulatory system: Secondary | ICD-10-CM | POA: Diagnosis not present

## 2015-10-03 DIAGNOSIS — M25761 Osteophyte, right knee: Secondary | ICD-10-CM | POA: Diagnosis present

## 2015-10-03 DIAGNOSIS — Z825 Family history of asthma and other chronic lower respiratory diseases: Secondary | ICD-10-CM | POA: Diagnosis not present

## 2015-10-03 DIAGNOSIS — Z01810 Encounter for preprocedural cardiovascular examination: Secondary | ICD-10-CM

## 2015-10-03 DIAGNOSIS — I2510 Atherosclerotic heart disease of native coronary artery without angina pectoris: Secondary | ICD-10-CM | POA: Diagnosis present

## 2015-10-03 DIAGNOSIS — M25561 Pain in right knee: Secondary | ICD-10-CM | POA: Diagnosis present

## 2015-10-03 DIAGNOSIS — E785 Hyperlipidemia, unspecified: Secondary | ICD-10-CM | POA: Diagnosis present

## 2015-10-03 DIAGNOSIS — M179 Osteoarthritis of knee, unspecified: Secondary | ICD-10-CM | POA: Diagnosis present

## 2015-10-03 DIAGNOSIS — M1711 Unilateral primary osteoarthritis, right knee: Principal | ICD-10-CM

## 2015-10-03 DIAGNOSIS — Z79899 Other long term (current) drug therapy: Secondary | ICD-10-CM | POA: Diagnosis not present

## 2015-10-03 DIAGNOSIS — Z888 Allergy status to other drugs, medicaments and biological substances status: Secondary | ICD-10-CM

## 2015-10-03 DIAGNOSIS — F419 Anxiety disorder, unspecified: Secondary | ICD-10-CM | POA: Diagnosis present

## 2015-10-03 DIAGNOSIS — Z85038 Personal history of other malignant neoplasm of large intestine: Secondary | ICD-10-CM | POA: Diagnosis not present

## 2015-10-03 DIAGNOSIS — G4733 Obstructive sleep apnea (adult) (pediatric): Secondary | ICD-10-CM | POA: Diagnosis present

## 2015-10-03 DIAGNOSIS — I10 Essential (primary) hypertension: Secondary | ICD-10-CM | POA: Diagnosis present

## 2015-10-03 DIAGNOSIS — Z87442 Personal history of urinary calculi: Secondary | ICD-10-CM

## 2015-10-03 DIAGNOSIS — Z7982 Long term (current) use of aspirin: Secondary | ICD-10-CM | POA: Diagnosis not present

## 2015-10-03 DIAGNOSIS — M1710 Unilateral primary osteoarthritis, unspecified knee: Secondary | ICD-10-CM | POA: Diagnosis present

## 2015-10-03 HISTORY — PX: TOTAL KNEE ARTHROPLASTY: SHX125

## 2015-10-03 SURGERY — ARTHROPLASTY, KNEE, TOTAL
Anesthesia: General | Site: Knee | Laterality: Right

## 2015-10-03 MED ORDER — ONDANSETRON HCL 4 MG/2ML IJ SOLN: 4.0000 mg | Freq: Four times a day (QID) | INTRAMUSCULAR | Status: DC | PRN

## 2015-10-03 MED ORDER — TADALAFIL 5 MG PO TABS: 5.0000 mg | ORAL_TABLET | Freq: Every evening | ORAL | Status: DC

## 2015-10-03 MED ORDER — TADALAFIL 5 MG PO TABS
5.0000 mg | ORAL_TABLET | Freq: Every evening | ORAL | Status: DC
  Filled 2015-10-03: qty 1

## 2015-10-03 MED ORDER — HYDROCODONE-ACETAMINOPHEN 5-325 MG PO TABS
1.0000 | ORAL_TABLET | ORAL | Status: DC | PRN
  Administered 2015-10-03 – 2015-10-04 (×5): 2 via ORAL
  Filled 2015-10-03 (×5): qty 2

## 2015-10-03 MED ORDER — SODIUM CHLORIDE 0.9 % IR SOLN
Status: DC | PRN
  Administered 2015-10-03: 1000 mL

## 2015-10-03 MED ORDER — FLEET ENEMA 7-19 GM/118ML RE ENEM: 1.0000 | ENEMA | Freq: Once | RECTAL | Status: DC | PRN

## 2015-10-03 MED ORDER — BUPIVACAINE-EPINEPHRINE (PF) 0.5% -1:200000 IJ SOLN
INTRAMUSCULAR | Status: AC
  Filled 2015-10-03: qty 30

## 2015-10-03 MED ORDER — APIXABAN 2.5 MG PO TABS
2.5000 mg | ORAL_TABLET | Freq: Two times a day (BID) | ORAL | Status: DC
  Administered 2015-10-04 – 2015-10-06 (×5): 2.5 mg via ORAL
  Filled 2015-10-03 (×7): qty 1

## 2015-10-03 MED ORDER — ACETAMINOPHEN 10 MG/ML IV SOLN
1000.0000 mg | Freq: Once | INTRAVENOUS | Status: AC
  Administered 2015-10-03: 1000 mg via INTRAVENOUS

## 2015-10-03 MED ORDER — TAMSULOSIN HCL 0.4 MG PO CAPS
0.4000 mg | ORAL_CAPSULE | Freq: Every morning | ORAL | Status: DC
Start: 2015-10-03 — End: 2015-10-06
  Administered 2015-10-04 – 2015-10-06 (×3): 0.4 mg via ORAL
  Filled 2015-10-03 (×4): qty 1

## 2015-10-03 MED ORDER — SUCCINYLCHOLINE CHLORIDE 20 MG/ML IJ SOLN
INTRAMUSCULAR | Status: DC | PRN
  Administered 2015-10-03: 100 mg via INTRAVENOUS

## 2015-10-03 MED ORDER — DOCUSATE SODIUM 100 MG PO CAPS
100.0000 mg | ORAL_CAPSULE | Freq: Two times a day (BID) | ORAL | Status: DC
  Administered 2015-10-03 – 2015-10-06 (×6): 100 mg via ORAL

## 2015-10-03 MED ORDER — MIDAZOLAM HCL 2 MG/2ML IJ SOLN
INTRAMUSCULAR | Status: AC
  Filled 2015-10-03: qty 2

## 2015-10-03 MED ORDER — TRAMADOL HCL 50 MG PO TABS
50.0000 mg | ORAL_TABLET | Freq: Four times a day (QID) | ORAL | Status: DC | PRN
Start: 2015-10-03 — End: 2015-10-06
  Administered 2015-10-04: 50 mg via ORAL
  Administered 2015-10-05 – 2015-10-06 (×2): 100 mg via ORAL
  Filled 2015-10-03 (×2): qty 2
  Filled 2015-10-03: qty 1

## 2015-10-03 MED ORDER — CEFAZOLIN SODIUM-DEXTROSE 2-3 GM-% IV SOLR
2.0000 g | Freq: Four times a day (QID) | INTRAVENOUS | Status: AC
  Administered 2015-10-03 (×2): 2 g via INTRAVENOUS
  Filled 2015-10-03 (×2): qty 50

## 2015-10-03 MED ORDER — HYDROMORPHONE HCL 1 MG/ML IJ SOLN
INTRAMUSCULAR | Status: DC | PRN
  Administered 2015-10-03 (×4): 0.5 mg via INTRAVENOUS

## 2015-10-03 MED ORDER — ONDANSETRON HCL 4 MG/2ML IJ SOLN: 4.0000 mg | Freq: Once | INTRAMUSCULAR | Status: DC | PRN

## 2015-10-03 MED ORDER — DILTIAZEM HCL ER COATED BEADS 180 MG PO CP24
180.0000 mg | ORAL_CAPSULE | Freq: Every day | ORAL | Status: DC
  Administered 2015-10-04 – 2015-10-05 (×2): 180 mg via ORAL
  Filled 2015-10-03 (×4): qty 1

## 2015-10-03 MED ORDER — BUPIVACAINE LIPOSOME 1.3 % IJ SUSP
20.0000 mL | Freq: Once | INTRAMUSCULAR | Status: DC
  Filled 2015-10-03: qty 20

## 2015-10-03 MED ORDER — DEXAMETHASONE SODIUM PHOSPHATE 10 MG/ML IJ SOLN
10.0000 mg | Freq: Once | INTRAMUSCULAR | Status: AC
  Administered 2015-10-03: 10 mg via INTRAVENOUS

## 2015-10-03 MED ORDER — ACETAMINOPHEN 500 MG PO TABS: 1000.0000 mg | ORAL_TABLET | Freq: Four times a day (QID) | ORAL | Status: DC

## 2015-10-03 MED ORDER — ONDANSETRON HCL 4 MG/2ML IJ SOLN
INTRAMUSCULAR | Status: AC
  Filled 2015-10-03: qty 2

## 2015-10-03 MED ORDER — CEFAZOLIN SODIUM-DEXTROSE 2-3 GM-% IV SOLR
INTRAVENOUS | Status: AC
Start: 2015-10-03 — End: 2015-10-03
  Filled 2015-10-03: qty 50

## 2015-10-03 MED ORDER — ONDANSETRON HCL 4 MG PO TABS: 4.0000 mg | ORAL_TABLET | Freq: Four times a day (QID) | ORAL | Status: DC | PRN

## 2015-10-03 MED ORDER — LACTATED RINGERS IV SOLN: INTRAVENOUS | Status: DC

## 2015-10-03 MED ORDER — SODIUM CHLORIDE 0.9 % IJ SOLN
INTRAMUSCULAR | Status: DC | PRN
  Administered 2015-10-03: 30 mL

## 2015-10-03 MED ORDER — SODIUM CHLORIDE 0.9 % IV SOLN: INTRAVENOUS | Status: DC

## 2015-10-03 MED ORDER — POLYETHYLENE GLYCOL 3350 17 G PO PACK: 17.0000 g | PACK | Freq: Every day | ORAL | Status: DC | PRN

## 2015-10-03 MED ORDER — METOCLOPRAMIDE HCL 5 MG/ML IJ SOLN: 5.0000 mg | Freq: Three times a day (TID) | INTRAMUSCULAR | Status: DC | PRN

## 2015-10-03 MED ORDER — MENTHOL 3 MG MT LOZG
1.0000 | LOZENGE | OROMUCOSAL | Status: DC | PRN
  Filled 2015-10-03 (×2): qty 9

## 2015-10-03 MED ORDER — ACETAMINOPHEN 325 MG PO TABS
650.0000 mg | ORAL_TABLET | Freq: Four times a day (QID) | ORAL | Status: DC | PRN
Start: 2015-10-04 — End: 2015-10-06
  Administered 2015-10-05 – 2015-10-06 (×2): 650 mg via ORAL
  Filled 2015-10-03 (×2): qty 2

## 2015-10-03 MED ORDER — 0.9 % SODIUM CHLORIDE (POUR BTL) OPTIME
TOPICAL | Status: DC | PRN
  Administered 2015-10-03: 1000 mL

## 2015-10-03 MED ORDER — CEFAZOLIN SODIUM-DEXTROSE 2-3 GM-% IV SOLR
2.0000 g | INTRAVENOUS | Status: AC
  Administered 2015-10-03: 2 g via INTRAVENOUS

## 2015-10-03 MED ORDER — DEXAMETHASONE SODIUM PHOSPHATE 10 MG/ML IJ SOLN
10.0000 mg | Freq: Once | INTRAMUSCULAR | Status: AC
  Administered 2015-10-04: 10 mg via INTRAVENOUS
  Filled 2015-10-03 (×2): qty 1

## 2015-10-03 MED ORDER — LORAZEPAM 0.5 MG PO TABS
0.5000 mg | ORAL_TABLET | Freq: Every day | ORAL | Status: DC | PRN
  Administered 2015-10-04 – 2015-10-05 (×2): 0.5 mg via ORAL
  Filled 2015-10-03 (×2): qty 1

## 2015-10-03 MED ORDER — ROSUVASTATIN CALCIUM 10 MG PO TABS
10.0000 mg | ORAL_TABLET | Freq: Every evening | ORAL | Status: DC
  Administered 2015-10-03 – 2015-10-05 (×3): 10 mg via ORAL
  Filled 2015-10-03 (×4): qty 1

## 2015-10-03 MED ORDER — MIDAZOLAM HCL 2 MG/2ML IJ SOLN
2.0000 mg | Freq: Once | INTRAMUSCULAR | Status: AC
  Administered 2015-10-03: 2 mg via INTRAVENOUS

## 2015-10-03 MED ORDER — DEXAMETHASONE SODIUM PHOSPHATE 10 MG/ML IJ SOLN
INTRAMUSCULAR | Status: AC
  Filled 2015-10-03: qty 1

## 2015-10-03 MED ORDER — PROPOFOL 10 MG/ML IV BOLUS
INTRAVENOUS | Status: AC
  Filled 2015-10-03: qty 20

## 2015-10-03 MED ORDER — HYDROMORPHONE HCL 1 MG/ML IJ SOLN
INTRAMUSCULAR | Status: AC
  Filled 2015-10-03: qty 1

## 2015-10-03 MED ORDER — STERILE WATER FOR IRRIGATION IR SOLN
Status: DC | PRN
  Administered 2015-10-03: 1000 mL

## 2015-10-03 MED ORDER — METHOCARBAMOL 500 MG PO TABS
500.0000 mg | ORAL_TABLET | Freq: Four times a day (QID) | ORAL | Status: DC | PRN
  Administered 2015-10-03 – 2015-10-06 (×8): 500 mg via ORAL
  Filled 2015-10-03 (×8): qty 1

## 2015-10-03 MED ORDER — OXYCODONE HCL 5 MG PO TABS
5.0000 mg | ORAL_TABLET | ORAL | Status: DC | PRN
  Filled 2015-10-03: qty 2

## 2015-10-03 MED ORDER — PHENYLEPHRINE HCL 10 MG/ML IJ SOLN
INTRAMUSCULAR | Status: DC | PRN
  Administered 2015-10-03: 40 ug via INTRAVENOUS

## 2015-10-03 MED ORDER — FENTANYL CITRATE (PF) 100 MCG/2ML IJ SOLN
INTRAMUSCULAR | Status: AC
  Filled 2015-10-03: qty 2

## 2015-10-03 MED ORDER — DEXTROSE-NACL 5-0.9 % IV SOLN
INTRAVENOUS | Status: DC
  Administered 2015-10-03: 100 mL/h via INTRAVENOUS
  Administered 2015-10-04: 04:00:00 via INTRAVENOUS

## 2015-10-03 MED ORDER — FENTANYL CITRATE (PF) 100 MCG/2ML IJ SOLN
100.0000 ug | Freq: Once | INTRAMUSCULAR | Status: AC
  Administered 2015-10-03: 100 ug via INTRAVENOUS

## 2015-10-03 MED ORDER — PHENOL 1.4 % MT LIQD
1.0000 | OROMUCOSAL | Status: DC | PRN
  Administered 2015-10-04: 1 via OROMUCOSAL
  Filled 2015-10-03 (×2): qty 177

## 2015-10-03 MED ORDER — LIDOCAINE HCL (CARDIAC) 20 MG/ML IV SOLN
INTRAVENOUS | Status: DC | PRN
  Administered 2015-10-03: 45 mg via INTRAVENOUS

## 2015-10-03 MED ORDER — ACETAMINOPHEN 10 MG/ML IV SOLN
INTRAVENOUS | Status: AC
  Filled 2015-10-03: qty 100

## 2015-10-03 MED ORDER — HYDROMORPHONE HCL 1 MG/ML IJ SOLN
0.2500 mg | INTRAMUSCULAR | Status: DC | PRN
  Administered 2015-10-03 (×4): 0.25 mg via INTRAVENOUS

## 2015-10-03 MED ORDER — ACETAMINOPHEN 650 MG RE SUPP: 650.0000 mg | Freq: Four times a day (QID) | RECTAL | Status: DC | PRN

## 2015-10-03 MED ORDER — LIDOCAINE HCL (CARDIAC) 20 MG/ML IV SOLN
INTRAVENOUS | Status: AC
  Filled 2015-10-03: qty 5

## 2015-10-03 MED ORDER — METHOCARBAMOL 1000 MG/10ML IJ SOLN
500.0000 mg | Freq: Four times a day (QID) | INTRAMUSCULAR | Status: DC | PRN
  Administered 2015-10-03: 500 mg via INTRAVENOUS
  Filled 2015-10-03 (×2): qty 5

## 2015-10-03 MED ORDER — FENTANYL CITRATE (PF) 100 MCG/2ML IJ SOLN
25.0000 ug | INTRAMUSCULAR | Status: DC | PRN
  Administered 2015-10-03 (×2): 50 ug via INTRAVENOUS

## 2015-10-03 MED ORDER — METOPROLOL SUCCINATE ER 25 MG PO TB24
25.0000 mg | ORAL_TABLET | Freq: Two times a day (BID) | ORAL | Status: DC
  Administered 2015-10-04 – 2015-10-06 (×4): 25 mg via ORAL
  Filled 2015-10-03 (×7): qty 1

## 2015-10-03 MED ORDER — BUPIVACAINE-EPINEPHRINE (PF) 0.5% -1:200000 IJ SOLN
INTRAMUSCULAR | Status: DC | PRN
  Administered 2015-10-03: 25 mL

## 2015-10-03 MED ORDER — CEFAZOLIN SODIUM-DEXTROSE 2-3 GM-% IV SOLR
INTRAVENOUS | Status: AC
  Filled 2015-10-03: qty 50

## 2015-10-03 MED ORDER — METOCLOPRAMIDE HCL 10 MG PO TABS: 5.0000 mg | ORAL_TABLET | Freq: Three times a day (TID) | ORAL | Status: DC | PRN

## 2015-10-03 MED ORDER — DIPHENHYDRAMINE HCL 12.5 MG/5ML PO ELIX
12.5000 mg | ORAL_SOLUTION | ORAL | Status: DC | PRN
  Administered 2015-10-03 – 2015-10-04 (×2): 25 mg via ORAL
  Administered 2015-10-05 – 2015-10-06 (×2): 12.5 mg via ORAL
  Filled 2015-10-03 (×4): qty 10

## 2015-10-03 MED ORDER — BISACODYL 10 MG RE SUPP
10.0000 mg | Freq: Every day | RECTAL | Status: DC | PRN
  Filled 2015-10-03: qty 1

## 2015-10-03 MED ORDER — SODIUM CHLORIDE 0.9 % IJ SOLN
INTRAMUSCULAR | Status: AC
  Filled 2015-10-03: qty 50

## 2015-10-03 MED ORDER — ONDANSETRON HCL 4 MG/2ML IJ SOLN
INTRAMUSCULAR | Status: DC | PRN
  Administered 2015-10-03: 4 mg via INTRAVENOUS

## 2015-10-03 MED ORDER — BUPIVACAINE LIPOSOME 1.3 % IJ SUSP
INTRAMUSCULAR | Status: DC | PRN
  Administered 2015-10-03: 20 mL

## 2015-10-03 MED ORDER — PROPOFOL 10 MG/ML IV BOLUS
INTRAVENOUS | Status: DC | PRN
  Administered 2015-10-03: 200 mg via INTRAVENOUS
  Administered 2015-10-03: 60 mg via INTRAVENOUS

## 2015-10-03 MED ORDER — HYDROMORPHONE HCL 2 MG/ML IJ SOLN
INTRAMUSCULAR | Status: AC
  Filled 2015-10-03: qty 1

## 2015-10-03 MED ORDER — MORPHINE SULFATE (PF) 2 MG/ML IV SOLN
1.0000 mg | INTRAVENOUS | Status: DC | PRN
  Administered 2015-10-04 – 2015-10-05 (×2): 1 mg via INTRAVENOUS
  Filled 2015-10-03 (×2): qty 1

## 2015-10-03 MED ORDER — TRANEXAMIC ACID 1000 MG/10ML IV SOLN
2000.0000 mg | INTRAVENOUS | Status: DC | PRN
  Administered 2015-10-03: 2000 mg via TOPICAL

## 2015-10-03 MED ORDER — LACTATED RINGERS IV SOLN
INTRAVENOUS | Status: DC | PRN
  Administered 2015-10-03 (×3): via INTRAVENOUS

## 2015-10-03 MED ORDER — SODIUM CHLORIDE 0.9 % IV SOLN
2000.0000 mg | Freq: Once | INTRAVENOUS | Status: DC
  Filled 2015-10-03: qty 20

## 2015-10-03 SURGICAL SUPPLY — 52 items
BAG DECANTER FOR FLEXI CONT (MISCELLANEOUS) ×2 IMPLANT
BAG SPEC THK2 15X12 ZIP CLS (MISCELLANEOUS) ×1
BAG ZIPLOCK 12X15 (MISCELLANEOUS) ×2 IMPLANT
BANDAGE ACE 6X5 VEL STRL LF (GAUZE/BANDAGES/DRESSINGS) ×2 IMPLANT
BLADE SAG 18X100X1.27 (BLADE) ×2 IMPLANT
BLADE SAW SGTL 11.0X1.19X90.0M (BLADE) ×2 IMPLANT
BOWL SMART MIX CTS (DISPOSABLE) ×2 IMPLANT
CAP KNEE TOTAL 3 SIGMA ×1 IMPLANT
CEMENT HV SMART SET (Cement) ×4 IMPLANT
CHLORAPREP W/TINT 10.5 ML (MISCELLANEOUS) ×1 IMPLANT
CLOTH BEACON ORANGE TIMEOUT ST (SAFETY) ×2 IMPLANT
CUFF TOURN SGL QUICK 34 (TOURNIQUET CUFF) ×2
CUFF TRNQT CYL 34X4X40X1 (TOURNIQUET CUFF) ×1 IMPLANT
DECANTER SPIKE VIAL GLASS SM (MISCELLANEOUS) ×2 IMPLANT
DRAPE U-SHAPE 47X51 STRL (DRAPES) ×3 IMPLANT
DRSG ADAPTIC 3X8 NADH LF (GAUZE/BANDAGES/DRESSINGS) ×2 IMPLANT
DRSG PAD ABDOMINAL 8X10 ST (GAUZE/BANDAGES/DRESSINGS) ×1 IMPLANT
DURAPREP 26ML APPLICATOR (WOUND CARE) ×1 IMPLANT
ELECT REM PT RETURN 9FT ADLT (ELECTROSURGICAL) ×2
ELECTRODE REM PT RTRN 9FT ADLT (ELECTROSURGICAL) ×1 IMPLANT
EVACUATOR 1/8 PVC DRAIN (DRAIN) ×2 IMPLANT
GAUZE SPONGE 4X4 12PLY STRL (GAUZE/BANDAGES/DRESSINGS) ×2 IMPLANT
GLOVE BIO SURGEON STRL SZ7.5 (GLOVE) IMPLANT
GLOVE BIO SURGEON STRL SZ8 (GLOVE) ×2 IMPLANT
GLOVE BIOGEL PI IND STRL 6.5 (GLOVE) IMPLANT
GLOVE BIOGEL PI IND STRL 8 (GLOVE) ×1 IMPLANT
GLOVE BIOGEL PI INDICATOR 6.5 (GLOVE)
GLOVE BIOGEL PI INDICATOR 8 (GLOVE) ×1
GLOVE SURG SS PI 6.5 STRL IVOR (GLOVE) IMPLANT
GOWN STRL REUS W/TWL LRG LVL3 (GOWN DISPOSABLE) ×2 IMPLANT
GOWN STRL REUS W/TWL XL LVL3 (GOWN DISPOSABLE) IMPLANT
HANDPIECE INTERPULSE COAX TIP (DISPOSABLE) ×2
IMMOBILIZER KNEE 20 (SOFTGOODS) ×2
IMMOBILIZER KNEE 20 THIGH 36 (SOFTGOODS) ×1 IMPLANT
MANIFOLD NEPTUNE II (INSTRUMENTS) ×2 IMPLANT
NS IRRIG 1000ML POUR BTL (IV SOLUTION) ×2 IMPLANT
PACK TOTAL KNEE CUSTOM (KITS) ×2 IMPLANT
PAD ABD 8X10 STRL (GAUZE/BANDAGES/DRESSINGS) ×1 IMPLANT
PADDING CAST COTTON 6X4 STRL (CAST SUPPLIES) ×5 IMPLANT
POSITIONER SURGICAL ARM (MISCELLANEOUS) ×2 IMPLANT
SET HNDPC FAN SPRY TIP SCT (DISPOSABLE) ×1 IMPLANT
STRIP CLOSURE SKIN 1/2X4 (GAUZE/BANDAGES/DRESSINGS) ×4 IMPLANT
SUT MNCRL AB 4-0 PS2 18 (SUTURE) ×2 IMPLANT
SUT VIC AB 2-0 CT1 27 (SUTURE) ×6
SUT VIC AB 2-0 CT1 TAPERPNT 27 (SUTURE) ×3 IMPLANT
SUT VLOC 180 0 24IN GS25 (SUTURE) ×2 IMPLANT
SYR 50ML LL SCALE MARK (SYRINGE) ×2 IMPLANT
TRAY FOLEY W/METER SILVER 14FR (SET/KITS/TRAYS/PACK) ×1 IMPLANT
TRAY FOLEY W/METER SILVER 16FR (SET/KITS/TRAYS/PACK) ×2 IMPLANT
WATER STERILE IRR 1500ML POUR (IV SOLUTION) ×2 IMPLANT
WRAP KNEE MAXI GEL POST OP (GAUZE/BANDAGES/DRESSINGS) ×2 IMPLANT
YANKAUER SUCT BULB TIP 10FT TU (MISCELLANEOUS) ×2 IMPLANT

## 2015-10-03 NOTE — Op Note (Signed)
Pre-operative diagnosis- Osteoarthritis  Right knee(s)  Post-operative diagnosis- Osteoarthritis Right knee(s)  Procedure-  Right  Total Knee Arthroplasty  Surgeon- [name]. Takao Lizer, MD  Assistant- Arlee Muslim, PA-C   Anesthesia-  General and adductor canal block  EBL-* No blood loss amount entered *   Drains Hemovac  Tourniquet time- 33 minutes @ [id number] mm HG   Complications- None  Condition-PACU - hemodynamically stable.   Brief Clinical Note  Robert [name] is a 70 y.o. year old male with end stage OA of his right knee with progressively worsening pain and dysfunction. He has constant pain, with activity and at rest and significant functional deficits with difficulties even with ADLs. He has had extensive non-op management including analgesics, injections of cortisone and viscosupplements, and home exercise program, but remains in significant pain with significant dysfunction. Radiographs show bone on bone arthritis medial and patellofemoral. He presents now for right Total Knee Arthroplasty.    Procedure in detail---   The patient is brought into the operating room and positioned supine on the operating table. After successful administration of  Adductor canal block then General anesthesia   a tourniquet is placed high on the  Right thigh(s) and the lower extremity is prepped and draped in the usual sterile fashion. Time out is performed by the operating team and then the  Right lower extremity is wrapped in Esmarch, knee flexed and the tourniquet inflated to 300 mmHg.       A midline incision is made with a ten blade through the subcutaneous tissue to the level of the extensor mechanism. A fresh blade is used to make a medial parapatellar arthrotomy. Soft tissue over the proximal medial tibia is subperiosteally elevated to the joint line with a knife and into the semimembranosus bursa with a Cobb elevator. Soft tissue over the proximal lateral tibia is elevated with attention being  paid to avoiding the patellar tendon on the tibial tubercle. The patella is everted, knee flexed 90 degrees and the ACL and PCL are removed. Findings are bone on bone medial and patellofemoral with large global osteophytes.        The drill is used to create a starting hole in the distal femur and the canal is thoroughly irrigated with sterile saline to remove the fatty contents. The 5 degree Right  valgus alignment guide is placed into the femoral canal and the distal femoral cutting block is pinned to remove 10 mm off the distal femur. Resection is made with an oscillating saw.      The tibia is subluxed forward and the menisci are removed. The extramedullary alignment guide is placed referencing proximally at the medial aspect of the tibial tubercle and distally along the second metatarsal axis and tibial crest. The block is pinned to remove 23mm off the more deficient medial  side. Resection is made with an oscillating saw. Size 7is the most appropriate size for the tibia and the proximal tibia is prepared with the modular drill and keel punch for that size.      The femoral sizing guide is placed and size 7 is most appropriate. Rotation is marked off the epicondylar axis and confirmed by creating a rectangular flexion gap at 90 degrees. The size 7 cutting block is pinned in this rotation and the anterior, posterior and chamfer cuts are made with the oscillating saw. The intercondylar block is then placed and that cut is made.      Trial size 7 tibial component, trial size 7  posterior stabilized femur and a 8  mm posterior stabilized rotating platform insert trial is placed. Full extension is achieved with excellent varus/valgus and anterior/posterior balance throughout full range of motion. The patella is everted and thickness measured to be 24  mm. Free hand resection is taken to 13 mm, a 41 template is placed, lug holes are drilled, trial patella is placed, and it tracks normally. Osteophytes are removed  off the posterior femur with the trial in place. All trials are removed and the cut bone surfaces prepared with pulsatile lavage. Cement is mixed and once ready for implantation, the size 7 tibial implant, size  7 posterior stabilized femoral component, and the size 41 patella are cemented in place and the patella is held with the clamp. The trial insert is placed and the knee held in full extension. The Exparel (20 ml mixed with 30 ml saline) and .25% Bupivicaine, are injected into the extensor mechanism, posterior capsule, medial and lateral gutters and subcutaneous tissues.  All extruded cement is removed and once the cement is hard the permanent 8 mm posterior stabilized rotating platform insert is placed into the tibial tray.      The wound is copiously irrigated with saline solution and the extensor mechanism closed over a hemovac drain with #1 V-loc suture. The tourniquet is released for a total tourniquet time of 33  minutes. Flexion against gravity is 140 degrees and the patella tracks normally. Subcutaneous tissue is closed with 2.0 vicryl and subcuticular with running 4.0 Monocryl. The incision is cleaned and dried and steri-strips and a bulky sterile dressing are applied. The limb is placed into a knee immobilizer and the patient is awakened and transported to recovery in stable condition.      Please note that a surgical assistant was a medical necessity for this procedure in order to perform it in a safe and expeditious manner. Surgical assistant was necessary to retract the ligaments and vital neurovascular structures to prevent injury to them and also necessary for proper positioning of the limb to allow for anatomic placement of the prosthesis.   [name] [name], MD    10/03/2015, 12:16 PM

## 2015-10-03 NOTE — Transfer of Care (Signed)
Immediate Anesthesia Transfer of Care Note  Patient: Robert [name]  Procedure(s) Performed: Procedure(s): RIGHT TOTAL KNEE ARTHROPLASTY (Right)  Patient Location: PACU  Anesthesia Type:GA combined with regional for post-op pain  Level of Consciousness:  sedated, patient cooperative and responds to stimulation  Airway & Oxygen Therapy:Patient Spontanous Breathing and Patient connected to face mask oxgen  Post-op Assessment:  Report given to PACU RN and Post -op Vital signs reviewed and stable  Post vital signs:  Reviewed and stable  Last Vitals:  Filed Vitals:   10/03/15 1049 10/03/15 1050  BP:    Pulse: 67 65  Temp:    Resp: 13 21    Complications: No apparent anesthesia complications

## 2015-10-03 NOTE — Discharge Instructions (Addendum)
Dr. Gaynelle Arabian Total Joint Specialist [facility] [address], [place], [place] [address] [phone]  TOTAL KNEE REPLACEMENT POSTOPERATIVE DIRECTIONS  Knee Rehabilitation, Guidelines Following Surgery  Results after knee surgery are often greatly improved when you follow the exercise, range of motion and muscle strengthening exercises prescribed by your doctor. Safety measures are also important to protect the knee from further injury. Any time any of these exercises cause you to have increased pain or swelling in your knee joint, decrease the amount until you are comfortable again and slowly increase them. If you have problems or questions, call your caregiver or physical therapist for advice.   HOME CARE INSTRUCTIONS  Remove items at home which could result in a fall. This includes throw rugs or furniture in walking pathways.   ICE to the affected knee every three hours for 30 minutes at a time and then as needed for pain and swelling.  Continue to use ice on the knee for pain and swelling from surgery. You may notice swelling that will progress down to the foot and ankle.  This is normal after surgery.  Elevate the leg when you are not up walking on it.    Continue to use the breathing machine which will help keep your temperature down.  It is common for your temperature to cycle up and down following surgery, especially at night when you are not up moving around and exerting yourself.  The breathing machine keeps your lungs expanded and your temperature down.  Do not place pillow under knee, focus on keeping the knee straight while resting  DIET You may resume your previous home diet once your are discharged from the hospital.  DRESSING / WOUND CARE / SHOWERING You may start showering once you are discharged home but do not submerge the incision under water. Just pat the incision dry and apply a dry gauze dressing on daily. Change the surgical dressing  daily and reapply a dry dressing each time.  ACTIVITY Walk with your walker as instructed. Use walker as long as suggested by your caregivers. Avoid periods of inactivity such as sitting longer than an hour when not asleep. This helps prevent blood clots.  You may resume a sexual relationship in one month or when given the OK by your doctor.  You may return to work once you are cleared by your doctor.  Do not drive a car for 6 weeks or until released by you surgeon.  Do not drive while taking narcotics.  WEIGHT BEARING AS TOLERATED  POSTOPERATIVE CONSTIPATION PROTOCOL Constipation - defined medically as fewer than three stools per week and severe constipation as less than one stool per week.  One of the most common issues patients have following surgery is constipation.  Even if you have a regular bowel pattern at home, your normal regimen is likely to be disrupted due to multiple reasons following surgery.  Combination of anesthesia, postoperative narcotics, change in appetite and fluid intake all can affect your bowels.  In order to avoid complications following surgery, here are some recommendations in order to help you during your recovery period.  Colace (docusate) - Pick up an over-the-counter form of Colace or another stool softener and take twice a day as long as you are requiring postoperative pain medications.  Take with a full glass of water daily.  If you experience loose stools or diarrhea, hold the colace until you stool forms back up.  If your symptoms do not get better  within 1 week or if they get worse, check with your doctor.  Dulcolax (bisacodyl) - Pick up over-the-counter and take as directed by the product packaging as needed to assist with the movement of your bowels.  Take with a full glass of water.  Use this product as needed if not relieved by Colace only.   MiraLax (polyethylene glycol) - Pick up over-the-counter to have on hand.  MiraLax is a solution that will  increase the amount of water in your bowels to assist with bowel movements.  Take as directed and can mix with a glass of water, juice, soda, coffee, or tea.  Take if you go more than two days without a movement. Do not use MiraLax more than once per day. Call your doctor if you are still constipated or irregular after using this medication for 7 days in a row.  If you continue to have problems with postoperative constipation, please contact the office for further assistance and recommendations.  If you experience "the worst abdominal pain ever" or develop nausea or vomiting, please contact the office immediatly for further recommendations for treatment.  ITCHING  If you experience itching with your medications, try taking only a single pain pill, or even half a pain pill at a time.  You can also use Benadryl over the counter for itching or also to help with sleep.   TED HOSE STOCKINGS Wear the elastic stockings on both legs for three weeks following surgery during the day but you may remove then at night for sleeping.  MEDICATIONS See your medication summary on the After Visit Summary that the nursing staff will review with you prior to discharge.  You may have some home medications which will be placed on hold until you complete the course of blood thinner medication.  It is important for you to complete the blood thinner medication as prescribed by your surgeon.  Continue your approved medications as instructed at time of discharge.  PRECAUTIONS If you experience chest pain or shortness of breath - call 911 immediately for transfer to the hospital emergency department.  If you develop a fever greater that 101 F, purulent drainage from wound, increased redness or drainage from wound, foul odor from the wound/dressing, or calf pain - CONTACT YOUR SURGEON.                                                   FOLLOW-UP APPOINTMENTS Make sure you keep all of your appointments after your operation  with your surgeon and caregivers. You should call the office at the above phone number and make an appointment for approximately two weeks after the date of your surgery or on the date instructed by your surgeon outlined in the "After Visit Summary".   RANGE OF MOTION AND STRENGTHENING EXERCISES  Rehabilitation of the knee is important following a knee injury or an operation. After just a few days of immobilization, the muscles of the thigh which control the knee become weakened and shrink (atrophy). Knee exercises are designed to build up the tone and strength of the thigh muscles and to improve knee motion. Often times heat used for twenty to thirty minutes before working out will loosen up your tissues and help with improving the range of motion but do not use heat for the first two weeks following surgery. These exercises can  be done on a training (exercise) mat, on the floor, on a table or on a bed. Use what ever works the best and is most comfortable for you Knee exercises include:  Leg Lifts - While your knee is still immobilized in a splint or cast, you can do straight leg raises. Lift the leg to 60 degrees, hold for 3 sec, and slowly lower the leg. Repeat 10-20 times 2-3 times daily. Perform this exercise against resistance later as your knee gets better.  Quad and Hamstring Sets - Tighten up the muscle on the front of the thigh (Quad) and hold for 5-10 sec. Repeat this 10-20 times hourly. Hamstring sets are done by pushing the foot backward against an object and holding for 5-10 sec. Repeat as with quad sets.   Leg Slides: Lying on your back, slowly slide your foot toward your buttocks, bending your knee up off the floor (only go as far as is comfortable). Then slowly slide your foot back down until your leg is flat on the floor again.  Angel Wings: Lying on your back spread your legs to the side as far apart as you can without causing discomfort.  A rehabilitation program following serious knee  injuries can speed recovery and prevent re-injury in the future due to weakened muscles. Contact your doctor or a physical therapist for more information on knee rehabilitation.   IF YOU ARE TRANSFERRED TO A SKILLED REHAB FACILITY If the patient is transferred to a skilled rehab facility following release from the hospital, a list of the current medications will be sent to the facility for the patient to continue.  When discharged from the skilled rehab facility, please have the facility set up the patient's [place] prior to being released. Also, the skilled facility will be responsible for providing the patient with their medications at time of release from the facility to include their pain medication, the muscle relaxants, and their blood thinner medication. If the patient is still at the rehab facility at time of the two week follow up appointment, the skilled rehab facility will also need to assist the patient in arranging follow up appointment in our office and any transportation needs.  MAKE SURE YOU:  Understand these instructions.  Get help right away if you are not doing well or get worse.    Pick up stool softner and laxative for home use following surgery while on pain medications. Do not submerge incision under water. Please use good hand washing techniques while changing dressing each day. May shower starting three days after surgery. Please use a clean towel to pat the incision dry following showers. Continue to use ice for pain and swelling after surgery. Do not use any lotions or creams on the incision until instructed by your surgeon.  Take Eliquis for two and a half more weeks, then discontinue Eliquis. Once the patient has completed the Xarelto, they may resume the 81 mg Aspirin.   Information on my medicine - ELIQUIS (apixaban)  This medication education was reviewed with me or my healthcare representative as part of my discharge preparation.  The  pharmacist that spoke with me during my hospital stay was:  Absher, Julieta Bellini, RPH  Why was Eliquis prescribed for you? Eliquis was prescribed for you to reduce the risk of blood clots forming after orthopedic surgery.    What do You need to know about Eliquis? Take your Eliquis TWICE DAILY - one tablet in the morning and one tablet in  the evening with or without food.  It would be best to take the dose about the same time each day.  If you have difficulty swallowing the tablet whole please discuss with your pharmacist how to take the medication safely.  Take Eliquis exactly as prescribed by your doctor and DO NOT stop taking Eliquis without talking to the doctor who prescribed the medication.  Stopping without other medication to take the place of Eliquis may increase your risk of developing a clot.  After discharge, you should have regular check-up appointments with your healthcare provider that is prescribing your Eliquis.  What do you do if you miss a dose? If a dose of ELIQUIS is not taken at the scheduled time, take it as soon as possible on the same day and twice-daily administration should be resumed.  The dose should not be doubled to make up for a missed dose.  Do not take more than one tablet of ELIQUIS at the same time.  Important Safety Information A possible side effect of Eliquis is bleeding. You should call your healthcare provider right away if you experience any of the following: ? Bleeding from an injury or your nose that does not stop. ? Unusual colored urine (red or dark brown) or unusual colored stools (red or black). ? Unusual bruising for unknown reasons. ? A serious fall or if you hit your head (even if there is no bleeding).  Some medicines may interact with Eliquis and might increase your risk of bleeding or clotting while on Eliquis. To help avoid this, consult your healthcare provider or pharmacist prior to using any new prescription or  non-prescription medications, including herbals, vitamins, non-steroidal anti-inflammatory drugs (NSAIDs) and supplements.  This website has more information on Eliquis (apixaban): http://www.eliquis.com/eliquis/home

## 2015-10-03 NOTE — Anesthesia Postprocedure Evaluation (Signed)
Anesthesia Post Note  Patient: Robert [name]  Procedure(s) Performed: Procedure(s) (LRB): RIGHT TOTAL KNEE ARTHROPLASTY (Right)  Patient location during evaluation: PACU Anesthesia Type: General and Regional Level of consciousness: awake and alert Pain management: pain level controlled Vital Signs Assessment: post-procedure vital signs reviewed and stable Respiratory status: spontaneous breathing, nonlabored ventilation, respiratory function stable and patient connected to nasal cannula oxygen Cardiovascular status: blood pressure returned to baseline and stable Postop Assessment: no signs of nausea or vomiting Anesthetic complications: no    Last Vitals:  Filed Vitals:   10/03/15 1330 10/03/15 1343  BP: 121/76 121/66  Pulse: 62 61  Temp:  36.4 C  Resp: 14 14    Last Pain:  Filed Vitals:   10/03/15 1359  PainSc: 4                  Kessa Fairbairn JENNETTE

## 2015-10-03 NOTE — Progress Notes (Signed)
Utilization review completed.  

## 2015-10-03 NOTE — Anesthesia Preprocedure Evaluation (Signed)
Anesthesia Evaluation  Patient identified by MRN, date of birth, ID band Patient awake    Reviewed: Allergy & Precautions, NPO status , Patient's Chart, lab work & pertinent test results  History of Anesthesia Complications Negative for: history of anesthetic complications  Airway Mallampati: II  TM Distance: >3 FB Neck ROM: Full    Dental no notable dental hx. (+) Dental Advisory Given   Pulmonary sleep apnea ,    Pulmonary exam normal breath sounds clear to auscultation       Cardiovascular hypertension, Pt. on medications + CAD  Normal cardiovascular exam Rhythm:Regular Rate:Normal     Neuro/Psych PSYCHIATRIC DISORDERS Anxiety negative neurological ROS     GI/Hepatic negative GI ROS, Neg liver ROS,   Endo/Other  negative endocrine ROS  Renal/GU negative Renal ROS  negative genitourinary   Musculoskeletal  (+) Arthritis ,   Abdominal   Peds negative pediatric ROS (+)  Hematology negative hematology ROS (+)   Anesthesia Other Findings   Reproductive/Obstetrics negative OB ROS                             Anesthesia Physical Anesthesia Plan  ASA: II  Anesthesia Plan: General   Post-op Pain Management: GA combined w/ Regional for post-op pain   Induction: Intravenous  Airway Management Planned: LMA  Additional Equipment:   Intra-op Plan:   Post-operative Plan: Extubation in OR  Informed Consent: I have reviewed the patients History and Physical, chart, labs and discussed the procedure including the risks, benefits and alternatives for the proposed anesthesia with the patient or authorized representative who has indicated his/her understanding and acceptance.   Dental advisory given  Plan Discussed with: CRNA  Anesthesia Plan Comments:         Anesthesia Quick Evaluation

## 2015-10-03 NOTE — Anesthesia Procedure Notes (Addendum)
Procedure Name: Intubation Date/Time: 10/03/2015 11:13 AM Performed by: Montel Clock Pre-anesthesia Checklist: Patient identified, Emergency Drugs available, Suction available, Patient being monitored and Timeout performed Patient Re-evaluated:Patient Re-evaluated prior to inductionOxygen Delivery Method: Circle system utilized Preoxygenation: Pre-oxygenation with 100% oxygen Intubation Type: IV induction Ventilation: Mask ventilation without difficulty Laryngoscope Size: Mac and 3 Grade View: Grade I Tube type: Oral Tube size: 7.5 mm Number of attempts: 1 Airway Equipment and Method: Stylet Placement Confirmation: ETT inserted through vocal cords under direct vision,  positive ETCO2 and breath sounds checked- equal and bilateral Secured at: 23 cm Tube secured with: Tape Dental Injury: Teeth and Oropharynx as per pre-operative assessment    Anesthesia Regional Block:  Adductor canal block  Pre-Anesthetic Checklist: ,, timeout performed, Correct Patient, Correct Site, Correct Laterality, Correct Procedure, Correct Position, site marked, Risks and benefits discussed,  Surgical consent,  Pre-op evaluation,  At surgeon's request and post-op pain management  Laterality: Right  Prep: chloraprep       Needles:  Injection technique: Single-shot  Needle Type: Echogenic Stimulator Needle      Needle Gauge: 21 and 21 G    Additional Needles:  Procedures: ultrasound guided (picture in chart) and nerve stimulator Adductor canal block Narrative:  Injection made incrementally with aspirations every 5 mL.  Performed by: Personally   Additional Notes: Risks, benefits and alternative to block explained extensively.  Patient tolerated procedure well, without complications.

## 2015-10-03 NOTE — Interval H&P Note (Signed)
History and Physical Interval Note:  10/03/2015 10:40 AM  Robert [name]  has presented today for surgery, with the diagnosis of OA OF RIGHT KNEE  The various methods of treatment have been discussed with the patient and family. After consideration of risks, benefits and other options for treatment, the patient has consented to  Procedure(s): RIGHT TOTAL KNEE ARTHROPLASTY (Right) as a surgical intervention .  The patient's history has been reviewed, patient examined, no change in status, stable for surgery.  I have reviewed the patient's chart and labs.  Questions were answered to the patient's satisfaction.     [name]

## 2015-10-04 LAB — CBC
HEMATOCRIT: 36.7 % — AB (ref 39.0–52.0)
Hemoglobin: 12 g/dL — ABNORMAL LOW (ref 13.0–17.0)
MCH: 29.4 pg (ref 26.0–34.0)
MCHC: 32.7 g/dL (ref 30.0–36.0)
MCV: 90 fL (ref 78.0–100.0)
PLATELETS: 147 10*3/uL — AB (ref 150–400)
RBC: 4.08 MIL/uL — ABNORMAL LOW (ref 4.22–5.81)
RDW: 13 % (ref 11.5–15.5)
WBC: 9 10*3/uL (ref 4.0–10.5)

## 2015-10-04 LAB — BASIC METABOLIC PANEL
ANION GAP: 6 (ref 5–15)
BUN: 16 mg/dL (ref 6–20)
CALCIUM: 9.5 mg/dL (ref 8.9–10.3)
CO2: 26 mmol/L (ref 22–32)
Chloride: 105 mmol/L (ref 101–111)
Creatinine, Ser: 0.87 mg/dL (ref 0.61–1.24)
Glucose, Bld: 156 mg/dL — ABNORMAL HIGH (ref 65–99)
Potassium: 4.3 mmol/L (ref 3.5–5.1)
SODIUM: 137 mmol/L (ref 135–145)

## 2015-10-04 MED ORDER — HYDROCODONE-ACETAMINOPHEN 7.5-325 MG PO TABS: 1.0000 | ORAL_TABLET | ORAL | Status: DC | PRN

## 2015-10-04 MED ORDER — METHOCARBAMOL 500 MG PO TABS: 500.0000 mg | ORAL_TABLET | Freq: Four times a day (QID) | ORAL | Status: DC | PRN

## 2015-10-04 MED ORDER — DILTIAZEM HCL ER COATED BEADS 180 MG PO CP24: 180.0000 mg | ORAL_CAPSULE | Freq: Every day | ORAL | Status: DC

## 2015-10-04 MED ORDER — APIXABAN 2.5 MG PO TABS: 2.5000 mg | ORAL_TABLET | Freq: Two times a day (BID) | ORAL | Status: DC

## 2015-10-04 MED ORDER — HYDROCODONE-ACETAMINOPHEN 7.5-325 MG PO TABS
1.0000 | ORAL_TABLET | ORAL | Status: DC | PRN
  Administered 2015-10-04 – 2015-10-05 (×6): 2 via ORAL
  Filled 2015-10-04 (×6): qty 2

## 2015-10-04 MED ORDER — TRAMADOL HCL 50 MG PO TABS: 50.0000 mg | ORAL_TABLET | Freq: Four times a day (QID) | ORAL | Status: DC | PRN

## 2015-10-04 NOTE — Evaluation (Signed)
Occupational Therapy Evaluation Patient Details Name: Robert [name] MRN: [id number] DOB: 12-13-1945 Today's Date: 10/04/2015    History of Present Illness s/p R TKA, h/o L TKA.  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 He was limited by pain at time of evaluation.  He will benefit from skilled OT to increase activity tolerance and safety with bathroom transfers.      Follow Up Recommendations  Supervision/Assistance - 24 hour    Equipment Recommendations  None recommended by OT    Recommendations for Other Services       Precautions / Restrictions Precautions Precautions: Knee;Fall Required Braces or Orthoses: Knee Immobilizer - Right Restrictions Weight Bearing Restrictions: No      Mobility Bed Mobility Overal bed mobility: Needs Assistance Bed Mobility: Supine to Sit     Supine to sit: Min assist     General bed mobility comments: oob  Transfers Overall transfer level: Needs assistance Equipment used: Rolling walker (2 wheeled) Transfers: Sit to/from Stand Sit to Stand: Min assist         General transfer comment: cues for UE/LE placement.  Transition quick and unsteady    Balance                                            ADL Overall ADL's : Needs assistance/impaired             Lower Body Bathing: Moderate assistance;Sit to/from stand       Lower Body Dressing: Maximal assistance;Sit to/from stand                 General ADL Comments: pt stood to try to urinate:  min A for steadying/safety as transitions are fast and unsteady.  Stood and tried to urinate at supervision level. Pt is able to perform UB adls with set up.  Needs increased assistance for LB due to pain.  Pt is most comfortable with KI on.     Vision     Perception     Praxis      Pertinent Vitals/Pain Pain Assessment: 0-10 Pain Score: 7  (with movement; 5 at  rest, back of knee) Pain Location: R knee Pain Descriptors / Indicators:  (could not describe) Pain Intervention(s): Limited activity within patient's tolerance;Monitored during session;Premedicated before session;Repositioned     Hand Dominance     Extremity/Trunk Assessment Upper Extremity Assessment Upper Extremity Assessment: Overall WFL for tasks assessed      Cervical / Trunk Assessment Cervical / Trunk Assessment: Normal   Communication Communication Communication: No difficulties   Cognition Arousal/Alertness: Awake/alert Behavior During Therapy: Anxious Overall Cognitive Status: Within Functional Limits for tasks assessed                     General Comments       Exercises       Shoulder Instructions      Home Living Family/patient expects to be discharged to:: Private residence Living Arrangements: Spouse/significant other Available Help at Discharge: Family Type of Home: House Home Access: Stairs to enter Technical brewer of Steps: 4 Entrance Stairs-Rails: Left Home Layout: Two level Alternate Level Stairs-Number of Steps: 12ish Alternate Level Stairs-Rails: Right Bathroom Shower/Tub: Occupational psychologist: Standard     Home Equipment: Crutches;Walker -  2 wheels   Additional Comments: bedroom is on 2nd floor; uses 3:1 over toilet downstairs      Prior Functioning/Environment Level of Independence: Independent             OT Diagnosis: Acute pain   OT Problem List: Decreased strength;Decreased activity tolerance;Decreased knowledge of use of DME or AE;Increased edema   OT Treatment/Interventions: Self-care/ADL training;DME and/or AE instruction;Patient/family education    OT Goals(Current goals can be found in the care plan section) Acute Rehab OT Goals Patient Stated Goal: to not have pain OT Goal Formulation: With patient Time For Goal Achievement: 10/11/15 Potential to Achieve Goals: Good ADL Goals Pt  Will Transfer to Toilet: with min guard assist;bedside commode;ambulating Pt Will Perform Tub/Shower Transfer: with min guard assist;ambulating;3 in 1  OT Frequency: Min 2X/week   Barriers to D/C:            Co-evaluation              End of Session CPM Right Knee CPM Right Knee: Off  Activity Tolerance: Patient limited by pain Patient left: in chair;with call bell/phone within reach   Time: 1023-1043 OT Time Calculation (min): 20 min Charges:  OT General Charges $OT Visit: 1 Procedure OT Evaluation $OT Eval Low Complexity: 1 Procedure G-Codes:    Bettye Sitton 2015-10-17, 10:59 AM  Lesle Chris, OTR/L [phone] 10/17/15

## 2015-10-04 NOTE — Progress Notes (Signed)
Physical Therapy Treatment Patient Details Name: Robert [name] MRN: [id number] DOB: [birth year] Today's Date: 10/04/2015    History of Present Illness s/p R TKA, h/o L TKA.  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 Encouraged patient that it may be due to R leg rolling outward as well as patient has significant pronation with pressure against the  Foot. instructed patient to have assistance placing a wedge to the outside of the R leg to prevent external rotation. Patient tolerated much better this PM with ambulation and exercises.  Follow Up Recommendations  Home health PT;Supervision - Intermittent     Equipment Recommendations  None recommended by PT    Recommendations for Other Services       Precautions / Restrictions Precautions Precautions: Knee;Fall Required Braces or Orthoses: Knee Immobilizer - Right    Mobility  Bed Mobility Overal bed mobility: Needs Assistance Bed Mobility: Sit to Supine       Sit to supine: Min assist   General bed mobility comments: assist with  R leg  Transfers Overall transfer level: Needs assistance Equipment used: Rolling walker (2 wheeled) Transfers: Sit to/from Stand Sit to Stand: Min assist         General transfer comment: cues for UE/LE placement.    Ambulation/Gait Ambulation/Gait assistance: Min assist Ambulation Distance (Feet): 100 Feet Assistive device: Rolling walker (2 wheeled) Gait Pattern/deviations: Step-to pattern;Step-through pattern;Antalgic     General Gait Details: multimodal cues for safety and sequence. Tolerated much better.   Stairs            Wheelchair Mobility    Modified Rankin (Stroke Patients Only)       Balance                                    Cognition Arousal/Alertness: Awake/alert                          Exercises Total Joint Exercises Ankle Circles/Pumps:  AROM;Both;10 reps;Supine Quad Sets: Both;10 reps;AROM;Supine Towel Squeeze: AROM;Both;10 reps;Supine Heel Slides: AAROM;Right;10 reps;Supine Hip ABduction/ADduction: AROM;Right;10 reps;Supine Straight Leg Raises: AAROM;Right;10 reps;Supine    General Comments        Pertinent Vitals/Pain Pain Score: 6  Pain Location: R knee Pain Descriptors / Indicators: Aching Pain Intervention(s): Limited activity within patient's tolerance;Monitored during session;Premedicated before session;Ice applied    Home Living                      Prior Function            PT Goals (current goals can now be found in the care plan section) Progress towards PT goals: Progressing toward goals    Frequency  7X/week    PT Plan Current plan remains appropriate    Co-evaluation             End of Session Equipment Utilized During Treatment: Gait belt;Right knee immobilizer Activity Tolerance: Patient tolerated treatment well Patient left: in bed;with call bell/phone within reach;with bed alarm set     Time: [id number] PT Time Calculation (min) (ACUTE ONLY): 37 min  Charges:  $Gait Training: 8-22 mins $Therapeutic Exercise: 23-37 mins                    G  Codes:      [name] 10/04/2015, 3:55 PM

## 2015-10-04 NOTE — Evaluation (Signed)
R TKAPhysical Therapy Evaluation Patient Details Name: Robert [name] MRN: [id number] DOB: August 21, 1946 Today's Date: 10/04/2015   History of Present Illness   RTKA on 10/03/15  Clinical Impression  Patient is experiencing sharp, spasm like pain in the R knee, mostly in the hamstrings. Has been maximally medicated per RN. Patient was found  In bed with the R leg externally rotated  And flexed at least 40 degrees. Slowly assisted in repositioning the  Leg  enough to place the KI to encourage knee extension.  Patient will benefit from PT to address  Problems listed in the note below.    Follow Up Recommendations Home health PT;Supervision - Intermittent    Equipment Recommendations  None recommended by PT    Recommendations for Other Services       Precautions / Restrictions Restrictions Weight Bearing Restrictions: No      Mobility  Bed Mobility Overal bed mobility: Needs Assistance Bed Mobility: Supine to Sit     Supine to sit: Min assist     General bed mobility comments: support the R leg to the floor, extra time to work through pain.  Transfers Overall transfer level: Needs assistance Equipment used: Rolling walker (2 wheeled) Transfers: Sit to/from Stand Sit to Stand: Mod assist         General transfer comment: cues for hand placement, support the R leg during the transition.Patient isanxious and stating the R leg is  very painful.. RN aware.  Ambulation/Gait Ambulation/Gait assistance: Min assist;+2 safety/equipment Ambulation Distance (Feet): 50 Feet Assistive device: Rolling walker (2 wheeled) Gait Pattern/deviations: Step-to pattern;Antalgic;Decreased stance time - right     General Gait Details: multimodal cues for safety and sequence.   Stairs            Wheelchair Mobility    Modified Rankin (Stroke Patients Only)       Balance                                             Pertinent Vitals/Pain Pain Assessment:  0-10 Pain Score: 10-Worst pain ever Pain Location: R knee, behind the knee Pain Descriptors / Indicators: Contraction;Cramping;Crushing Pain Intervention(s): Limited activity within patient's tolerance;Monitored during session;Premedicated before session;Repositioned    Home Living Family/patient expects to be discharged to:: Private residence Living Arrangements: Spouse/significant other Available Help at Discharge: Family Type of Home: House Home Access: Stairs to enter Entrance Stairs-Rails: Left Entrance Stairs-Number of Steps: 4 Home Layout: Two level Home Equipment: Toilet riser;Walker - 2 wheels;Crutches      Prior Function Level of Independence: Independent               Hand Dominance        Extremity/Trunk Assessment   Upper Extremity Assessment: Defer to OT evaluation           Lower Extremity Assessment: RLE deficits/detail RLE Deficits / Details: requires assist to lift the leg.    Cervical / Trunk Assessment: Normal  Communication   Communication: No difficulties  Cognition Arousal/Alertness: Awake/alert Behavior During Therapy: Anxious Overall Cognitive Status: Within Functional Limits for tasks assessed                      General Comments      Exercises Total Joint Exercises Quad Sets: AROM;Right;5 reps;Supine      Assessment/Plan    PT Assessment Patient needs  continued PT services  PT Diagnosis Difficulty walking;Acute pain   PT Problem List Decreased strength;Decreased range of motion;Decreased activity tolerance;Decreased mobility;Decreased knowledge of use of DME;Decreased safety awareness;Decreased knowledge of precautions;Pain  PT Treatment Interventions DME instruction;Gait training;Stair training;Functional mobility training;Therapeutic activities;Therapeutic exercise;Patient/family education   PT Goals (Current goals can be found in the Care Plan section) Acute Rehab PT Goals Patient Stated Goal: to not have  pain PT Goal Formulation: With patient Time For Goal Achievement: 10/11/15 Potential to Achieve Goals: Good    Frequency 7X/week   Barriers to discharge        Co-evaluation               End of Session Equipment Utilized During Treatment: Gait belt;Right knee immobilizer Activity Tolerance: Patient limited by pain Patient left: in chair;with call bell/phone within reach;with chair alarm set Nurse Communication: Mobility status         Time: [id number] PT Time Calculation (min) (ACUTE ONLY): 42 min   Charges:   PT Evaluation $PT Eval Low Complexity: 1 Procedure PT Treatments $Gait Training: 8-22 mins $Therapeutic Exercise: 8-22 mins   PT G Codes:        [name] 10/04/2015, 10:40 AM Tresa Endo PT [phone]

## 2015-10-04 NOTE — Progress Notes (Signed)
   Subjective: 1 Day Post-Op Procedure(s) (LRB): RIGHT TOTAL KNEE ARTHROPLASTY (Right) Patient reports pain as mild.   We will start therapy today.  Plan is to go Home after hospital stay.  Objective: Vital signs in last 24 hours: Temp:  [97.6 F (36.4 C)-99 F (37.2 C)] 98.1 F (36.7 C) (01/07 0401) Pulse Rate:  [55-79] 62 (01/07 0401) Resp:  [8-21] 16 (01/07 0401) BP: (94-144)/(50-78) 100/54 mmHg (01/07 0401) SpO2:  [96 %-100 %] 97 % (01/07 0401) Weight:  [77.565 kg (171 lb)] 77.565 kg (171 lb) (01/06 1343)  Intake/Output from previous day:  Intake/Output Summary (Last 24 hours) at 10/04/15 0717 Last data filed at 10/04/15 0405  Gross per 24 hour  Intake   4070 ml  Output   4020 ml  Net     50 ml    Intake/Output this shift:    Labs:  Recent Labs  10/04/15 0527  HGB 12.0*    Recent Labs  10/04/15 0527  WBC 9.0  RBC 4.08*  HCT 36.7*  PLT 147*    Recent Labs  10/04/15 0527  NA 137  K 4.3  CL 105  CO2 26  BUN 16  CREATININE 0.87  GLUCOSE 156*  CALCIUM 9.5   No results for input(s): LABPT, INR in the last 72 hours.  EXAM General - Patient is Alert, Appropriate and Oriented Extremity - Neurologically intact Neurovascular intact No cellulitis present Compartment soft Dressing - dressing C/D/I Motor Function - intact, moving foot and toes well on exam.  Hemovac pulled without difficulty.  Past Medical History  Diagnosis Date  . Hypertension   . Hyperlipidemia   . S/P cardiac catheterization 1995 and 1998    a. following abnormal stress tests. Both reportedly normal;  b. 02/2013 nl myoview.  . Anxiety   . 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 Nocturia associated with benign prostatic hypertrophy 03/21/2013  . Palpitations   . Edema of right foot     ? muscle problem to be addressed after left knee TKA on 01/06/2015 per patient   . History of kidney stones   . Arthritis     Knee , Neck  . Colon cancer [facility])     a.  s/p colon resection in 2004 and 2002  . Complication of anesthesia   . Sleep apnea with use of continuous positive airway pressure (CPAP)     cpap- 4 setting , temperature setting of 72  humidity only wears 4 hours per patient    Assessment/Plan: 1 Day Post-Op Procedure(s) (LRB): RIGHT TOTAL KNEE ARTHROPLASTY (Right) Active Problems:   OA (osteoarthritis) of knee   Advance diet Up with therapy D/C IV fluids Plan for discharge tomorrow  DVT Prophylaxis - Xarelto Weight-Bearing as tolerated to right leg  Robert [name] V 10/04/2015, 7:17 AM

## 2015-10-04 NOTE — Care Management Note (Signed)
Case Management Note  Patient Details  Name: Robert [name] MRN: [id number] Date of Birth: 1946-08-07  Subjective/Objective:      PT recommends home therapy, discussed with spouse.  She requested referral to [facility] and liaison notified.                      Expected Discharge Plan:  [place]  Discharge planning Services  CM Consult  Post Acute Care Choice:  Home Health Choice offered to:  Spouse  HH Arranged:  PT HH Agency:  [facility]  Status of Service:  Completed, signed off  Girard Cooter, [place] 10/04/2015, 11:30 AM

## 2015-10-05 LAB — BASIC METABOLIC PANEL
ANION GAP: 7 (ref 5–15)
BUN: 16 mg/dL (ref 6–20)
CO2: 26 mmol/L (ref 22–32)
Calcium: 9.9 mg/dL (ref 8.9–10.3)
Chloride: 106 mmol/L (ref 101–111)
Creatinine, Ser: 0.94 mg/dL (ref 0.61–1.24)
GFR calc Af Amer: >60 mL/min (ref >60)
GFR calc non Af Amer: >60 mL/min (ref >60)
GLUCOSE: 132 mg/dL — AB (ref 65–99)
POTASSIUM: 4.5 mmol/L (ref 3.5–5.1)
Sodium: 139 mmol/L (ref 135–145)

## 2015-10-05 LAB — CBC
HEMATOCRIT: 35 % — AB (ref 39.0–52.0)
Hemoglobin: 11.6 g/dL — ABNORMAL LOW (ref 13.0–17.0)
MCH: 29.7 pg (ref 26.0–34.0)
MCHC: 33.1 g/dL (ref 30.0–36.0)
MCV: 89.5 fL (ref 78.0–100.0)
Platelets: 161 10*3/uL (ref 150–400)
RBC: 3.91 MIL/uL — AB (ref 4.22–5.81)
RDW: 13.2 % (ref 11.5–15.5)
WBC: 10.2 10*3/uL (ref 4.0–10.5)

## 2015-10-05 MED ORDER — HYDROMORPHONE HCL 2 MG PO TABS
2.0000 mg | ORAL_TABLET | ORAL | Status: DC | PRN
  Administered 2015-10-05: 2 mg via ORAL
  Filled 2015-10-05: qty 1

## 2015-10-05 MED ORDER — HYDROMORPHONE HCL 2 MG PO TABS
2.0000 mg | ORAL_TABLET | ORAL | Status: DC | PRN
  Administered 2015-10-05: 4 mg via ORAL
  Administered 2015-10-05 (×2): 2 mg via ORAL
  Administered 2015-10-06 (×3): 4 mg via ORAL
  Filled 2015-10-05 (×3): qty 2
  Filled 2015-10-05 (×2): qty 1
  Filled 2015-10-05: qty 2

## 2015-10-05 NOTE — Progress Notes (Signed)
Occupational Therapy Treatment Patient Details Name: Robert [name] MRN: [id number] DOB: September 10, 1946 Today's Date: 10/05/2015    History of present illness s/p R TKA, h/o L TKA.  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  Pt continues to be anxious.  He did not feel he could perform shower transfer this am  Follow Up Recommendations  Supervision/Assistance - 24 hour    Equipment Recommendations  None recommended by OT    Recommendations for Other Services      Precautions / Restrictions Precautions Precautions: Knee;Fall Required Braces or Orthoses: Knee Immobilizer - Right Restrictions Weight Bearing Restrictions: No       Mobility Bed Mobility               General bed mobility comments: oob  Transfers   Equipment used: Rolling walker (2 wheeled) Transfers: Sit to/from Stand Sit to Stand: Min guard         General transfer comment: cues for UE/LE placement    Balance                                   ADL                           Toilet Transfer: Min guard;Ambulation;BSC             General ADL Comments: verbally reviewed shower sequence:  pt rates pain at 5 but states too much to practice shower transfer at this time.        Vision                     Perception     Praxis      Cognition   Behavior During Therapy: Anxious Overall Cognitive Status:  (Seems overwhelmed--needed repetition of cues today)                       Extremity/Trunk Assessment               Exercises     Shoulder Instructions       General Comments      Pertinent Vitals/ Pain       Pain Score: 5  Pain Location: R knee Pain Descriptors / Indicators: Aching Pain Intervention(s): Limited activity within patient's tolerance;Monitored during session;Premedicated before session;Repositioned;Ice applied  Home Living                                           Prior Functioning/Environment              Frequency Min 2X/week     Progress Toward Goals  OT Goals(current goals can now be found in the care plan section)  Progress towards OT goals: Progressing toward goals     Plan      Co-evaluation                 End of Session CPM Right Knee CPM Right Knee: Off   Activity Tolerance Patient limited by pain   Patient Left in chair;with call bell/phone within reach   Nurse Communication          Time: [id number] OT Time Calculation (min): 21 min  Charges: OT General Charges $  OT Visit: 1 Procedure OT Treatments $Self Care/Home Management : 8-22 mins  Adreonna Yontz 10/05/2015, 10:22 AM Lesle Chris, OTR/L [phone] 10/05/2015

## 2015-10-05 NOTE — Progress Notes (Signed)
Physical Therapy Treatment Patient Details Name: Robert [name] MRN: [id number] DOB: 1945-12-04 Today's Date: 10/05/2015    History of Present Illness s/p R TKA, h/o L TKA.  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 Still very guarded with each movement and grimaces with pain. Pt stated he may be heading home tomorrow.   Follow Up Recommendations  Home health PT;Supervision - Intermittent     Equipment Recommendations  None recommended by PT    Recommendations for Other Services       Precautions / Restrictions Precautions Precautions: Knee;Fall Required Braces or Orthoses: Knee Immobilizer - Right Knee Immobilizer - Right:  (used knee immobilezer this morning. ) Restrictions Weight Bearing Restrictions: No    Mobility  Bed Mobility               General bed mobility comments: oob  Transfers Overall transfer level: Needs assistance Equipment used: Rolling walker (2 wheeled) Transfers: Sit to/from Stand Sit to Stand: Min guard         General transfer comment: cues for UE/LE placement  Ambulation/Gait Ambulation/Gait assistance: Min assist Ambulation Distance (Feet): 90 Feet Assistive device: Rolling walker (2 wheeled) Gait Pattern/deviations: Step-to pattern     General Gait Details: educated to favor a little in order to help with his pain management with step to pattern and use of UEs.    Stairs            Wheelchair Mobility    Modified Rankin (Stroke Patients Only)       Balance                                    Cognition Arousal/Alertness: Awake/alert Behavior During Therapy: WFL for tasks assessed/performed (better today, howeer still a little anxious about pain meds and what will help. ) Overall Cognitive Status: Within Functional Limits for tasks assessed                      Exercises Total Joint Exercises Ankle  Circles/Pumps: AROM;Both;10 reps;Supine Quad Sets: 10 reps;AROM;Supine;Right Heel Slides: AAROM;Right;10 reps;Supine (educated with breathing and seemed to help decrease the garding involved. )    General Comments        Pertinent Vitals/Pain Pain Assessment: 0-10 Pain Score: 5  Pain Location: R knee Pain Descriptors / Indicators: Aching Pain Intervention(s): Monitored during session;Premedicated before session;Ice applied (also showed pt how to position LE in bed to prevent the external rotation)    Home Living                      Prior Function            PT Goals (current goals can now be found in the care plan section) Progress towards PT goals: Progressing toward goals    Frequency  7X/week    PT Plan Current plan remains appropriate    Co-evaluation             End of Session Equipment Utilized During Treatment: Gait belt;Right knee immobilizer Activity Tolerance: Patient tolerated treatment well Patient left: in bed;with call bell/phone within reach;with bed alarm set     Time: 1000-1039 PT Time Calculation (min) (ACUTE ONLY): 39 min  Charges:  $Gait Training: 8-22 mins $Therapeutic Exercise: 8-22 mins  G CodesClide Dales 10/05/2015, 11:53 AM Clide Dales, PT Pager: [phone] 10/05/2015

## 2015-10-05 NOTE — Progress Notes (Signed)
Subjective: 2 Days Post-Op Procedure(s) (LRB): RIGHT TOTAL KNEE ARTHROPLASTY (Right) Patient reports pain as 7 on 0-10 scale.  More painful than previous knee. Hydrocodone not working.  Objective: Vital signs in last 24 hours: Temp:  [97.5 F (36.4 C)-97.9 F (36.6 C)] 97.9 F (36.6 C) (01/08 0456) Pulse Rate:  [58-74] 68 (01/08 0738) Resp:  [16] 16 (01/08 0456) BP: (121-142)/(54-66) 132/58 mmHg (01/08 0738) SpO2:  [99 %-100 %] 100 % (01/08 0456)  Intake/Output from previous day: 01/07 0701 - 01/08 0700 In: 840 [P.O.:840] Out: 525 [Urine:525] Intake/Output this shift:     Recent Labs  10/04/15 0527 10/05/15 0435  HGB 12.0* 11.6*    Recent Labs  10/04/15 0527 10/05/15 0435  WBC 9.0 10.2  RBC 4.08* 3.91*  HCT 36.7* 35.0*  PLT 147* 161    Recent Labs  10/04/15 0527 10/05/15 0435  NA 137 139  K 4.3 4.5  CL 105 106  CO2 26 26  BUN 16 16  CREATININE 0.87 0.94  GLUCOSE 156* 132*  CALCIUM 9.5 9.9   No results for input(s): LABPT, INR in the last 72 hours.  Neurovascular intact Intact pulses distally Dorsiflexion/Plantar flexion intact Incision: dressing C/D/I Compartment soft  Assessment/Plan: 2 Days Post-Op Procedure(s) (LRB): RIGHT TOTAL KNEE ARTHROPLASTY (Right) Up with therapy Plan for discharge tomorrow Change pain med to Dilaudid. Yvonna Brun ANDREW 10/05/2015, 9:51 AM

## 2015-10-05 NOTE — Progress Notes (Signed)
Physical Therapy Treatment Patient Details Name: Robert [name] MRN: [id number] DOB: [birth year] Today's Date: 10/05/2015    History of Present Illness s/p R TKA, h/o L TKA.  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 Educated pt on relaxing in order to better tolerate PT and daily activities, secondary to pt very guarded and tense .      Follow Up Recommendations  Home health PT;Supervision - Intermittent     Equipment Recommendations  None recommended by PT    Recommendations for Other Services       Precautions / Restrictions Precautions Precautions: Knee;Fall Required Braces or Orthoses: Knee Immobilizer - Right Knee Immobilizer - Right:  (used knee immobilezer this morning. ) Restrictions Weight Bearing Restrictions: No    Mobility  Bed Mobility               General bed mobility comments: oob  Transfers Overall transfer level: Needs assistance Equipment used: Rolling walker (2 wheeled) Transfers: Sit to/from Stand Sit to Stand: Min guard         General transfer comment: cues for UE/LE placement  Ambulation/Gait Ambulation/Gait assistance: Min assist Ambulation Distance (Feet): 100 Feet Assistive device: Rolling walker (2 wheeled) Gait Pattern/deviations: Step-to pattern Gait velocity: slow   General Gait Details: educated to favor a little in order to help with his pain management with step to pattern and use of UEs.    Stairs            Wheelchair Mobility    Modified Rankin (Stroke Patients Only)       Balance                                    Cognition Arousal/Alertness: Awake/alert Behavior During Therapy: WFL for tasks assessed/performed  (better today, howeer still a little anxious about pain meds and what will help. ) Overall Cognitive Status: Within Functional Limits for tasks assessed                      Exercises Total Joint Exercises Ankle Circles/Pumps: AROM;Both;10 reps;Supine Quad Sets: 10 reps;AROM;Supine;Right Heel Slides: AAROM;Right;10 reps;Supine (educated with breathing and seemed to help decrease the garding involved. ) Hip ABduction/ADduction: AAROM;Supine;Right;10 reps Straight Leg Raises: AAROM;Supine;Right;5 reps    General Comments        Pertinent Vitals/Pain Pain Score: 5  Pain Location: R knee Pain Descriptors / Indicators: Aching Pain Intervention(s): Monitored during session;Premedicated before session;Ice applied (educated with breathing techniques and positioning of R LE in bed to prevent external rotation)    Home Living                      Prior Function            PT Goals (current goals can now be found in the care plan section) Progress towards PT goals: Progressing toward goals    Frequency  7X/week    PT Plan Current plan remains appropriate    Co-evaluation             End of Session Equipment Utilized During Treatment: Gait belt;Right  knee immobilizer Activity Tolerance: Patient tolerated treatment well Patient left: in bed;with call bell/phone within reach;with bed alarm set     Time: [id number] PT Time Calculation (min) (ACUTE ONLY): 30 min  Charges:  $Gait Training: 8-22 mins $Therapeutic Exercise: 8-22 mins                    G CodesClide Dales 22-Oct-2015, 6:03 PM  Clide Dales, PT Pager: [phone] Oct 22, 2015

## 2015-10-06 ENCOUNTER — Encounter (HOSPITAL_COMMUNITY): Payer: Self-pay | Admitting: Orthopedic Surgery

## 2015-10-06 LAB — CBC
HCT: 31.1 % — ABNORMAL LOW (ref 39.0–52.0)
Hemoglobin: 10.2 g/dL — ABNORMAL LOW (ref 13.0–17.0)
MCH: 29.5 pg (ref 26.0–34.0)
MCHC: 32.8 g/dL (ref 30.0–36.0)
MCV: 89.9 fL (ref 78.0–100.0)
PLATELETS: 138 10*3/uL — AB (ref 150–400)
RBC: 3.46 MIL/uL — ABNORMAL LOW (ref 4.22–5.81)
RDW: 13.2 % (ref 11.5–15.5)
WBC: 8.6 10*3/uL (ref 4.0–10.5)

## 2015-10-06 MED ORDER — HYDROMORPHONE HCL 2 MG PO TABS: 2.0000 mg | ORAL_TABLET | ORAL | Status: DC | PRN

## 2015-10-06 NOTE — Progress Notes (Signed)
Rn reviewed discharge instructions with patient and wife. All questions answered.   Paperwork and prescriptions given.   NT rolled patient down in wheelchair with all belongings to family car.

## 2015-10-06 NOTE — Progress Notes (Signed)
Occupational Therapy Treatment Patient Details Name: Robert [name] MRN: [id number] DOB: 02/26/1946 Today's Date: 10/06/2015    History of present illness s/p R TKA, h/o L TKA.  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 Declines to practice due to pain and "loopy" from pain medication. OT will follow.   Follow Up Recommendations  Supervision/Assistance - 24 hour    Equipment Recommendations  None recommended by OT    Recommendations for Other Services      Precautions / Restrictions Precautions Precautions: Knee;Fall Required Braces or Orthoses: Knee Immobilizer - Right Restrictions Weight Bearing Restrictions: No       Mobility Bed Mobility                  Transfers                      Balance                                   ADL Overall ADL's : Needs assistance/impaired                                       General ADL Comments: Verbal review of LB bathing/dressing techniques and shower transfer. He has a walk-in shower and instructed to use RW for transfer and to have 3 in 1 inside shower for a seat. Patient falling asleep during session due to pain medication. OT will check back another time.      Vision                     Perception     Praxis      Cognition   Behavior During Therapy: WFL for tasks assessed/performed Overall Cognitive Status: Within Functional Limits for tasks assessed                       Extremity/Trunk Assessment               Exercises     Shoulder Instructions       General Comments      Pertinent Vitals/ Pain       Pain Assessment: 0-10 Pain Score: 5  Pain Location: R knee Pain Descriptors / Indicators: Aching Pain Intervention(s): Monitored during session;Premedicated before session;Limited activity within patient's  tolerance  Home Living                                          Prior Functioning/Environment              Frequency Min 2X/week     Progress Toward Goals  OT Goals(current goals can now be found in the care plan section)  Progress towards OT goals: Progressing toward goals     Plan Discharge plan remains appropriate    Co-evaluation                 End of Session     Activity Tolerance Patient limited by lethargy;Patient limited by pain   Patient Left in bed;with call bell/phone within reach   Nurse Communication Other (  comment) (pt recently had pain medication)        Time: [id number] OT Time Calculation (min): 10 min  Charges: OT General Charges $OT Visit: 1 Procedure OT Treatments $Self Care/Home Management : 8-22 mins  Toryn Dewalt A 10/06/2015, 11:56 AM

## 2015-10-06 NOTE — Progress Notes (Signed)
   Subjective: 3 Days Post-Op Procedure(s) (LRB): RIGHT TOTAL KNEE ARTHROPLASTY (Right) Patient reports pain as mild and moderate.   Patient seen in rounds by Dr. Wynelle Link. Patient is well, but has had some minor complaints of pain in the knee, requiring pain medications Patient is ready to go home  Objective: Vital signs in last 24 hours: Temp:  [98.2 F (36.8 C)-101.7 F (38.7 C)] 98.5 F (36.9 C) (01/09 0448) Pulse Rate:  [61-79] 61 (01/09 0448) Resp:  [16-18] 16 (01/09 0448) BP: (119-134)/(53-60) 119/57 mmHg (01/09 0448) SpO2:  [95 %-100 %] 100 % (01/09 0448)  Intake/Output from previous day:  Intake/Output Summary (Last 24 hours) at 10/06/15 0720 Last data filed at 10/05/15 2125  Gross per 24 hour  Intake    480 ml  Output    450 ml  Net     30 ml    Labs:  Recent Labs  10/04/15 0527 10/05/15 0435 10/06/15 0410  HGB 12.0* 11.6* 10.2*    Recent Labs  10/05/15 0435 10/06/15 0410  WBC 10.2 8.6  RBC 3.91* 3.46*  HCT 35.0* 31.1*  PLT 161 138*    Recent Labs  10/04/15 0527 10/05/15 0435  NA 137 139  K 4.3 4.5  CL 105 106  CO2 26 26  BUN 16 16  CREATININE 0.87 0.94  GLUCOSE 156* 132*  CALCIUM 9.5 9.9   No results for input(s): LABPT, INR in the last 72 hours.  EXAM: General - Patient is Alert, Appropriate and Oriented Extremity - Neurovascular intact Sensation intact distally Dorsiflexion/Plantar flexion intact Incision - clean, dry, no drainage Motor Function - intact, moving foot and toes well on exam.   Assessment/Plan: 3 Days Post-Op Procedure(s) (LRB): RIGHT TOTAL KNEE ARTHROPLASTY (Right) Procedure(s) (LRB): RIGHT TOTAL KNEE ARTHROPLASTY (Right) Past Medical History  Diagnosis Date  . Hypertension   . Hyperlipidemia   . S/P cardiac catheterization 1995 and 1998    a. following abnormal stress tests. Both reportedly normal;  b. 02/2013 nl myoview.  . Anxiety   . 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 Nocturia  associated with benign prostatic hypertrophy 03/21/2013  . Palpitations   . Edema of right foot     ? muscle problem to be addressed after left knee TKA on 01/06/2015 per patient   . History of kidney stones   . Arthritis     Knee , Neck  . Colon cancer [facility])     a. s/p colon resection in 2004 and 2002  . Complication of anesthesia   . Sleep apnea with use of continuous positive airway pressure (CPAP)     cpap- 4 setting , temperature setting of 72  humidity only wears 4 hours per patient   Active Problems:   OA (osteoarthritis) of knee  Estimated body mass index is 25.24 kg/(m^2) as calculated from the following:   Height as of this encounter: 5\' 9"  (1.753 m).   Weight as of this encounter: 77.565 kg (171 lb). Up with therapy Discharge home with home health Diet - Cardiac diet Follow up - in 2 weeks Activity - WBAT Disposition - Home Condition Upon Discharge - Good D/C Meds - See DC Summary DVT Prophylaxis - Eliquis  Arlee Muslim, PA-C Orthopaedic Surgery 10/06/2015, 7:20 AM

## 2015-10-06 NOTE — Progress Notes (Signed)
Physical Therapy Treatment Patient Details Name: Robert [name] MRN: [id number] DOB: 19-May-1946 Today's Date: 10/06/2015    History of Present Illness s/p R TKA, h/o L TKA.  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  Assisted with amb in hallway and practicing stairs required increased time and increased cueing due to decreased cognition(meds?).  Performed 4 steps using one crutch and one rail along with spouse.  Instructed on HEP freq and use of ICE.  Addressed all mobility questions.   Follow Up Recommendations  Home health PT;Supervision - Intermittent     Equipment Recommendations  None recommended by PT (has everything from prior TKR)    Recommendations for Other Services       Precautions / Restrictions Precautions Precautions: Knee;Fall Precaution Comments: instructed pt and spouse on KI use for amb and esp stairs Required Braces or Orthoses: Knee Immobilizer - Right Restrictions Weight Bearing Restrictions: No    Mobility  Bed Mobility               General bed mobility comments: Pt OOB in bathroom  Transfers Overall transfer level: Needs assistance Equipment used: Rolling walker (2 wheeled) Transfers: Sit to/from Stand Sit to Stand: Supervision;Min guard         General transfer comment: increased time and 25% VC's to extend R LE prior to sit   Ambulation/Gait Ambulation/Gait assistance: Supervision;Min guard Ambulation Distance (Feet): 125 Feet Assistive device: Rolling walker (2 wheeled) Gait Pattern/deviations: Step-to pattern;Decreased stance time - right;Trunk flexed Gait velocity: decreased   General Gait Details: 25% VC's on proper sequencing and proper upright posture    Stairs    4 steps using one crutch and one rail with spouse at 25% VC's on safe handling and proper equipment sequencing        Wheelchair Mobility    Modified Rankin (Stroke Patients  Only)       Balance                                    Cognition Arousal/Alertness: Awake/alert Behavior During Therapy: WFL for tasks assessed/performed Overall Cognitive Status: Within Functional Limits for tasks assessed                      Exercises      General Comments        Pertinent Vitals/Pain Pain Assessment: 0-10 Pain Score: 4  Pain Location: R knee Pain Descriptors / Indicators: Grimacing;Sore;Tender Pain Intervention(s): Monitored during session;Repositioned;Ice applied    Home Living                      Prior Function            PT Goals (current goals can now be found in the care plan section) Progress towards PT goals: Progressing toward goals    Frequency  7X/week    PT Plan Current plan remains appropriate    Co-evaluation             End of Session Equipment Utilized During Treatment: Gait belt;Right knee immobilizer Activity Tolerance: Patient tolerated treatment well Patient left: with call bell/phone within reach;in chair;with family/visitor present     Time: [id number] PT Time Calculation (min) (ACUTE ONLY): 40 min  Charges:  $Gait Training: 23-37 mins $Therapeutic Activity: 8-22 mins  G Codes:      Rica Koyanagi  PTA [facility]  Acute  Rehab Pager      [phone]

## 2015-10-06 NOTE — Discharge Summary (Signed)
Physician Discharge Summary   Patient ID: Robert [name] MRN: [id number] DOB/AGE: 28-Feb-1946 70 y.o.  Admit date: 10/03/2015 Discharge date: 10-06-2015  Primary Diagnosis:  Osteoarthritis Right knee(s)  Admission Diagnoses:  Past Medical History  Diagnosis Date  . Hypertension   . Hyperlipidemia   . S/P cardiac catheterization 1995 and 1998    a. following abnormal stress tests. Both reportedly normal;  b. 02/2013 nl myoview.  . Anxiety   . 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 Nocturia associated with benign prostatic hypertrophy 03/21/2013  . Palpitations   . Edema of right foot     ? muscle problem to be addressed after left knee TKA on 01/06/2015 per patient   . History of kidney stones   . Arthritis     Knee , Neck  . Colon cancer [facility])     a. s/p colon resection in 2004 and 2002  . Complication of anesthesia   . Sleep apnea with use of continuous positive airway pressure (CPAP)     cpap- 4 setting , temperature setting of 72  humidity only wears 4 hours per patient   Discharge Diagnoses:   Active Problems:   OA (osteoarthritis) of knee  Estimated body mass index is 25.24 kg/(m^2) as calculated from the following:   Height as of this encounter: 5' 9"  (1.753 m).   Weight as of this encounter: 77.565 kg (171 lb).  Procedure:  Procedure(s) (LRB): RIGHT TOTAL KNEE ARTHROPLASTY (Right)   Consults: None  HPI: Robert [name] is a 70 y.o. year old male with end stage OA of his right knee with progressively worsening pain and dysfunction. He has constant pain, with activity and at rest and significant functional deficits with difficulties even with ADLs. He has had extensive non-op management including analgesics, injections of cortisone and viscosupplements, and home exercise program, but remains in significant pain with significant dysfunction. Radiographs show bone on bone arthritis medial and patellofemoral. He presents now for right Total Knee  Arthroplasty.   Laboratory Data: Admission on 10/03/2015  Component Date Value Ref Range Status  . WBC 10/04/2015 9.0  4.0 - 10.5 K/uL Final  . RBC 10/04/2015 4.08* 4.22 - 5.81 MIL/uL Final  . Hemoglobin 10/04/2015 12.0* 13.0 - 17.0 g/dL Final  . HCT 10/04/2015 36.7* 39.0 - 52.0 % Final  . MCV 10/04/2015 90.0  78.0 - 100.0 fL Final  . MCH 10/04/2015 29.4  26.0 - 34.0 pg Final  . MCHC 10/04/2015 32.7  30.0 - 36.0 g/dL Final  . RDW 10/04/2015 13.0  11.5 - 15.5 % Final  . Platelets 10/04/2015 147* 150 - 400 K/uL Final  . Sodium 10/04/2015 137  135 - 145 mmol/L Final  . Potassium 10/04/2015 4.3  3.5 - 5.1 mmol/L Final  . Chloride 10/04/2015 105  101 - 111 mmol/L Final  . CO2 10/04/2015 26  22 - 32 mmol/L Final  . Glucose, Bld 10/04/2015 156* 65 - 99 mg/dL Final  . BUN 10/04/2015 16  6 - 20 mg/dL Final  . Creatinine, Ser 10/04/2015 0.87  0.61 - 1.24 mg/dL Final  . Calcium 10/04/2015 9.5  8.9 - 10.3 mg/dL Final  . GFR calc non Af Amer 10/04/2015 >60  >60 mL/min Final  . GFR calc Af Amer 10/04/2015 >60  >60 mL/min Final   Comment: (NOTE) The eGFR has been calculated using the CKD EPI equation. This calculation has not been validated in all clinical situations. eGFR's persistently <60 mL/min signify  possible Chronic Kidney Disease.   . Anion gap 10/04/2015 6  5 - 15 Final  . WBC 10/05/2015 10.2  4.0 - 10.5 K/uL Final  . RBC 10/05/2015 3.91* 4.22 - 5.81 MIL/uL Final  . Hemoglobin 10/05/2015 11.6* 13.0 - 17.0 g/dL Final  . HCT 10/05/2015 35.0* 39.0 - 52.0 % Final  . MCV 10/05/2015 89.5  78.0 - 100.0 fL Final  . MCH 10/05/2015 29.7  26.0 - 34.0 pg Final  . MCHC 10/05/2015 33.1  30.0 - 36.0 g/dL Final  . RDW 10/05/2015 13.2  11.5 - 15.5 % Final  . Platelets 10/05/2015 161  150 - 400 K/uL Final  . Sodium 10/05/2015 139  135 - 145 mmol/L Final  . Potassium 10/05/2015 4.5  3.5 - 5.1 mmol/L Final  . Chloride 10/05/2015 106  101 - 111 mmol/L Final  . CO2 10/05/2015 26  22 - 32 mmol/L Final   . Glucose, Bld 10/05/2015 132* 65 - 99 mg/dL Final  . BUN 10/05/2015 16  6 - 20 mg/dL Final  . Creatinine, Ser 10/05/2015 0.94  0.61 - 1.24 mg/dL Final  . Calcium 10/05/2015 9.9  8.9 - 10.3 mg/dL Final  . GFR calc non Af Amer 10/05/2015 >60  >60 mL/min Final  . GFR calc Af Amer 10/05/2015 >60  >60 mL/min Final   Comment: (NOTE) The eGFR has been calculated using the CKD EPI equation. This calculation has not been validated in all clinical situations. eGFR's persistently <60 mL/min signify possible Chronic Kidney Disease.   . Anion gap 10/05/2015 7  5 - 15 Final  . WBC 10/06/2015 8.6  4.0 - 10.5 K/uL Final  . RBC 10/06/2015 3.46* 4.22 - 5.81 MIL/uL Final  . Hemoglobin 10/06/2015 10.2* 13.0 - 17.0 g/dL Final  . HCT 10/06/2015 31.1* 39.0 - 52.0 % Final  . MCV 10/06/2015 89.9  78.0 - 100.0 fL Final  . MCH 10/06/2015 29.5  26.0 - 34.0 pg Final  . MCHC 10/06/2015 32.8  30.0 - 36.0 g/dL Final  . RDW 10/06/2015 13.2  11.5 - 15.5 % Final  . Platelets 10/06/2015 138* 150 - 400 K/uL Final  Hospital Outpatient Visit on 09/25/2015  Component Date Value Ref Range Status  . aPTT 09/25/2015 29  24 - 37 seconds Final  . WBC 09/25/2015 5.0  4.0 - 10.5 K/uL Final  . RBC 09/25/2015 4.99  4.22 - 5.81 MIL/uL Final  . Hemoglobin 09/25/2015 14.4  13.0 - 17.0 g/dL Final  . HCT 09/25/2015 44.0  39.0 - 52.0 % Final  . MCV 09/25/2015 88.2  78.0 - 100.0 fL Final  . MCH 09/25/2015 28.9  26.0 - 34.0 pg Final  . MCHC 09/25/2015 32.7  30.0 - 36.0 g/dL Final  . RDW 09/25/2015 13.2  11.5 - 15.5 % Final  . Platelets 09/25/2015 185  150 - 400 K/uL Final  . Sodium 09/25/2015 138  135 - 145 mmol/L Final  . Potassium 09/25/2015 4.3  3.5 - 5.1 mmol/L Final  . Chloride 09/25/2015 104  101 - 111 mmol/L Final  . CO2 09/25/2015 26  22 - 32 mmol/L Final  . Glucose, Bld 09/25/2015 85  65 - 99 mg/dL Final  . BUN 09/25/2015 29* 6 - 20 mg/dL Final  . Creatinine, Ser 09/25/2015 0.95  0.61 - 1.24 mg/dL Final  . Calcium  09/25/2015 10.4* 8.9 - 10.3 mg/dL Final  . Total Protein 09/25/2015 6.7  6.5 - 8.1 g/dL Final  . Albumin 09/25/2015 4.1  3.5 - 5.0  g/dL Final  . AST 09/25/2015 26  15 - 41 U/L Final  . ALT 09/25/2015 31  17 - 63 U/L Final  . Alkaline Phosphatase 09/25/2015 89  38 - 126 U/L Final  . Total Bilirubin 09/25/2015 0.6  0.3 - 1.2 mg/dL Final  . GFR calc non Af Amer 09/25/2015 >60  >60 mL/min Final  . GFR calc Af Amer 09/25/2015 >60  >60 mL/min Final   Comment: (NOTE) The eGFR has been calculated using the CKD EPI equation. This calculation has not been validated in all clinical situations. eGFR's persistently <60 mL/min signify possible Chronic Kidney Disease.   . Anion gap 09/25/2015 8  5 - 15 Final  . Prothrombin Time 09/25/2015 13.3  11.6 - 15.2 seconds Final  . INR 09/25/2015 0.99  0.00 - 1.49 Final  . ABO/RH(D) 09/25/2015 O POS   Final  . Antibody Screen 09/25/2015 NEG   Final  . Sample Expiration 09/25/2015 10/09/2015   Final  . Extend sample reason 09/25/2015 NO TRANSFUSIONS OR PREGNANCY IN THE PAST 3 MONTHS   Final  . Color, Urine 09/25/2015 YELLOW  YELLOW Final  . APPearance 09/25/2015 CLEAR  CLEAR Final  . Specific Gravity, Urine 09/25/2015 1.019  1.005 - 1.030 Final  . pH 09/25/2015 6.0  5.0 - 8.0 Final  . Glucose, UA 09/25/2015 NEGATIVE  NEGATIVE mg/dL Final  . Hgb urine dipstick 09/25/2015 NEGATIVE  NEGATIVE Final  . Bilirubin Urine 09/25/2015 NEGATIVE  NEGATIVE Final  . Ketones, ur 09/25/2015 NEGATIVE  NEGATIVE mg/dL Final  . Protein, ur 09/25/2015 NEGATIVE  NEGATIVE mg/dL Final  . Nitrite 09/25/2015 NEGATIVE  NEGATIVE Final  . Leukocytes, UA 09/25/2015 NEGATIVE  NEGATIVE Final   MICROSCOPIC NOT DONE ON URINES WITH NEGATIVE PROTEIN, BLOOD, LEUKOCYTES, NITRITE, OR GLUCOSE <1000 mg/dL.  Marland Kitchen MRSA, PCR 09/25/2015 NEGATIVE  NEGATIVE Final  . Staphylococcus aureus 09/25/2015 NEGATIVE  NEGATIVE Final   Comment:        The Xpert SA Assay (FDA approved for NASAL specimens in  patients over 34 years of age), is one component of a comprehensive surveillance program.  Test performance has been validated by [facility] for patients greater than or equal to 23 year old. It is not intended to diagnose infection nor to guide or monitor treatment.      X-Rays:No results found.  EKG: Orders placed or performed during the hospital encounter of 09/25/15  . EKG 12 lead  . EKG 12 lead     Hospital Course: AARNAV STEAGALL is a 70 y.o. who was admitted to [facility]. They were brought to the operating room on 10/03/2015 and underwent Procedure(s): RIGHT TOTAL KNEE ARTHROPLASTY.  Patient tolerated the procedure well and was later transferred to the recovery room and then to the orthopaedic floor for postoperative care.  They were given PO and IV analgesics for pain control following their surgery.  They were given 24 hours of postoperative antibiotics of  Anti-infectives    Start     Dose/Rate Route Frequency Ordered Stop   10/03/15 1700  ceFAZolin (ANCEF) IVPB 2 g/50 mL premix     2 g 100 mL/hr over 30 Minutes Intravenous Every 6 hours 10/03/15 1354 10/04/15 0016   10/03/15 0908  ceFAZolin (ANCEF) IVPB 2 g/50 mL premix     2 g 100 mL/hr over 30 Minutes Intravenous On call to O.R. 10/03/15 0908 10/03/15 1118     and started on DVT prophylaxis in the form of Xarelto.   PT  and OT were ordered for total joint protocol.  Discharge planning consulted to help with postop disposition and equipment needs.  Patient had a decent night on the evening of surgery.  They started to get up OOB with therapy on day one. Hemovac drain was pulled without difficulty.  Continued to work with therapy into day two.  Dressing was changed on day two and the incision was healing well.  By day three, the patient had progressed with therapy and meeting their goals.  Incision was healing well.  Patient was seen in rounds and was ready to go home.  Discharge home with home health Diet -  Cardiac diet Follow up - in 2 weeks Activity - WBAT Disposition - Home Condition Upon Discharge - Good D/C Meds - See DC Summary DVT Prophylaxis - Eliquis  Discharge Instructions    Call MD / Call 911    Complete by:  As directed   If you experience chest pain or shortness of breath, CALL 911 and be transported to the hospital emergency room.  If you develope a fever above 101 F, pus (white drainage) or increased drainage or redness at the wound, or calf pain, call your surgeon's office.     Change dressing    Complete by:  As directed   Change dressing daily with sterile 4 x 4 inch gauze dressing and apply TED hose. Do not submerge the incision under water.     Constipation Prevention    Complete by:  As directed   Drink plenty of fluids.  Prune juice may be helpful.  You may use a stool softener, such as Colace (over the counter) 100 mg twice a day.  Use MiraLax (over the counter) for constipation as needed.     Diet - low sodium heart healthy    Complete by:  As directed      Discharge instructions    Complete by:  As directed   Pick up stool softner and laxative for home use following surgery while on pain medications. Do not submerge incision under water. Please use good hand washing techniques while changing dressing each day. May shower starting three days after surgery. Please use a clean towel to pat the incision dry following showers. Continue to use ice for pain and swelling after surgery. Do not use any lotions or creams on the incision until instructed by your surgeon.  Take Eliquis twice a day for two and a half more weeks, then discontinue Eliquis. Once the patient has completed the Xarelto, they may resume the 81 mg Aspirin.  Postoperative Constipation Protocol  Constipation - defined medically as fewer than three stools per week and severe constipation as less than one stool per week.  One of the most common issues patients have following surgery is constipation.   Even if you have a regular bowel pattern at home, your normal regimen is likely to be disrupted due to multiple reasons following surgery.  Combination of anesthesia, postoperative narcotics, change in appetite and fluid intake all can affect your bowels.  In order to avoid complications following surgery, here are some recommendations in order to help you during your recovery period.  Colace (docusate) - Pick up an over-the-counter form of Colace or another stool softener and take twice a day as long as you are requiring postoperative pain medications.  Take with a full glass of water daily.  If you experience loose stools or diarrhea, hold the colace until you stool forms back up.  If  your symptoms do not get better within 1 week or if they get worse, check with your doctor.  Dulcolax (bisacodyl) - Pick up over-the-counter and take as directed by the product packaging as needed to assist with the movement of your bowels.  Take with a full glass of water.  Use this product as needed if not relieved by Colace only.   MiraLax (polyethylene glycol) - Pick up over-the-counter to have on hand.  MiraLax is a solution that will increase the amount of water in your bowels to assist with bowel movements.  Take as directed and can mix with a glass of water, juice, soda, coffee, or tea.  Take if you go more than two days without a movement. Do not use MiraLax more than once per day. Call your doctor if you are still constipated or irregular after using this medication for 7 days in a row.  If you continue to have problems with postoperative constipation, please contact the office for further assistance and recommendations.  If you experience "the worst abdominal pain ever" or develop nausea or vomiting, please contact the office immediatly for further recommendations for treatment.     Do not put a pillow under the knee. Place it under the heel.    Complete by:  As directed      Do not sit on low chairs, stoools  or toilet seats, as it may be difficult to get up from low surfaces    Complete by:  As directed      Driving restrictions    Complete by:  As directed   No driving until released by the physician.     Increase activity slowly as tolerated    Complete by:  As directed      Lifting restrictions    Complete by:  As directed   No lifting until released by the physician.     Patient may shower    Complete by:  As directed   You may shower without a dressing once there is no drainage.  Do not wash over the wound.  If drainage remains, do not shower until drainage stops.     TED hose    Complete by:  As directed   Use stockings (TED hose) for 3 weeks on both leg(s).  You may remove them at night for sleeping.     Weight bearing as tolerated    Complete by:  As directed             Medication List    STOP taking these medications        aspirin 81 MG tablet     atovaquone-proguanil 250-100 MG Tabs tablet  Commonly known as:  MALARONE     b complex vitamins tablet     ciprofloxacin 500 MG tablet  Commonly known as:  CIPRO     FISH OIL PO     multivitamin tablet     typhoid DR capsule  Commonly known as:  VIVOTIF      TAKE these medications        acetaZOLAMIDE 125 MG tablet  Commonly known as:  DIAMOX  Take 1 tablet (125 mg total) by mouth 2 (two) times daily. Start day before going to high alt. Take twice/day x 2-3 days. At max altitude     amoxicillin 500 MG capsule  Commonly known as:  AMOXIL  TAKE 4 CAPSULES 1 HOUR BEFORE DENTAL APPOINTMENT     apixaban 2.5 MG Tabs tablet  Commonly  known as:  ELIQUIS  Take 1 tablet (2.5 mg total) by mouth every 12 (twelve) hours.     CIALIS 5 MG tablet  Generic drug:  tadalafil  Take 5 mg by mouth every evening.     diltiazem 180 MG 24 hr capsule  Commonly known as:  CARDIZEM CD  Take 1 capsule (180 mg total) by mouth at bedtime.     diphenhydrAMINE 12.5 MG/5ML syrup  Commonly known as:  BENYLIN  Take 12.5 mg by mouth  at bedtime as needed for sleep.     HYDROcodone-acetaminophen 7.5-325 MG tablet  Commonly known as:  NORCO  Take 1-2 tablets by mouth every 4 (four) hours as needed for moderate pain or severe pain.     HYDROmorphone 2 MG tablet  Commonly known as:  DILAUDID  Take 1-2 tablets (2-4 mg total) by mouth every 4 (four) hours as needed for moderate pain or severe pain.     KERYDIN 5 % Soln  Generic drug:  Tavaborole  Apply 1 application topically daily. Apply to toes     lisinopril-hydrochlorothiazide 20-12.5 MG tablet  Commonly known as:  PRINZIDE,ZESTORETIC  Take 1 tablet by mouth every morning.     LORazepam 0.5 MG tablet  Commonly known as:  ATIVAN  Take 0.5 mg by mouth daily as needed for anxiety. When heart races     methocarbamol 500 MG tablet  Commonly known as:  ROBAXIN  Take 1 tablet (500 mg total) by mouth every 6 (six) hours as needed for muscle spasms.     metoprolol succinate 25 MG 24 hr tablet  Commonly known as:  TOPROL-XL  Take 1 tablet (25 mg total) by mouth 2 (two) times daily.     NON FORMULARY  CPAP MACHINE     PROBIOTIC DAILY PO  Take 1 capsule by mouth daily.     rosuvastatin 10 MG tablet  Commonly known as:  CRESTOR  Take 1 tablet (10 mg total) by mouth every evening.     traMADol 50 MG tablet  Commonly known as:  ULTRAM  Take 1-2 tablets (50-100 mg total) by mouth every 6 (six) hours as needed for moderate pain.           Follow-up Information    Follow up with [name], MD. Schedule an appointment as soon as possible for a visit on 10/16/2015.   Specialty:  Orthopedic Surgery   Why:  Call [phone] Monday to make the appointment   Contact information:   [address] [place] [address] [phone]       Signed: Arlee Muslim, PA-C Orthopaedic Surgery 10/06/2015, 7:25 AM

## 2015-10-23 ENCOUNTER — Telehealth: Payer: Self-pay | Admitting: Cardiology

## 2015-10-23 NOTE — Telephone Encounter (Signed)
New Message  Pt called request a call back to discuss post operative hypertension medication.

## 2015-10-23 NOTE — Telephone Encounter (Signed)
Returned call to patient.He stated he recently had right knee replacement.Stated B/P medication was held in hospital due to being low.Stated B/P coming back up ranging 130/70 to 135/70 pulse 70 in afternoons.B/P this morning 112/60 pulse 60.Stated 3 days ago he started taking toprol 25 mg at 3:00 pm and diltiazem 180 mg at night.Stated he wanted to let Dr.Jordan know and wanted to know when he should restart lisinopril.Message sent to [place] for advice.

## 2015-10-23 NOTE — Telephone Encounter (Signed)
Returned call to patient Dr.Jordan advised to resume lisinopril.

## 2015-10-23 NOTE — Telephone Encounter (Signed)
He should be able to resume lisinopril now.  Ayuub Penley Martin[place] MD, [facility]

## 2015-12-02 ENCOUNTER — Ambulatory Visit: Payer: BLUE CROSS/BLUE SHIELD | Admitting: Cardiology

## 2015-12-02 ENCOUNTER — Encounter: Payer: Self-pay | Admitting: Cardiology

## 2015-12-02 ENCOUNTER — Ambulatory Visit (INDEPENDENT_AMBULATORY_CARE_PROVIDER_SITE_OTHER): Payer: BLUE CROSS/BLUE SHIELD | Admitting: Cardiology

## 2015-12-02 VITALS — BP 104/66 | HR 54 | Ht 69.0 in | Wt 164.0 lb

## 2015-12-02 DIAGNOSIS — I10 Essential (primary) hypertension: Secondary | ICD-10-CM | POA: Diagnosis not present

## 2015-12-02 DIAGNOSIS — I2510 Atherosclerotic heart disease of native coronary artery without angina pectoris: Secondary | ICD-10-CM

## 2015-12-02 DIAGNOSIS — E785 Hyperlipidemia, unspecified: Secondary | ICD-10-CM | POA: Diagnosis not present

## 2015-12-02 NOTE — Patient Instructions (Signed)
Continue your current therapy  I will see you in 6 months.   

## 2015-12-03 NOTE — Progress Notes (Signed)
Robert [name] Date of Birth: 1946/06/29   History of Present Illness: Robert [name] is seen today for followup HTN. He is s/p right TKR in January. Post op BP dropped and his BP meds were held. Since then he has gradually resumed. His BP is doing well and he feels very well. No chest pain or SOB. Planning on a trip to [place] this spring.   Current Outpatient Prescriptions on File Prior to Visit  Medication Sig Dispense Refill  . acetaZOLAMIDE (DIAMOX) 125 MG tablet Take 1 tablet (125 mg total) by mouth 2 (two) times daily. Start day before going to high alt. Take twice/day x 2-3 days. At max altitude 10 tablet 0  . amoxicillin (AMOXIL) 500 MG capsule TAKE 4 CAPSULES 1 HOUR BEFORE DENTAL APPOINTMENT  2  . apixaban (ELIQUIS) 2.5 MG TABS tablet Take 1 tablet (2.5 mg total) by mouth every 12 (twelve) hours. 38 tablet 0  . CIALIS 5 MG tablet Take 5 mg by mouth every evening.     . diltiazem (CARDIZEM CD) 180 MG 24 hr capsule Take 1 capsule (180 mg total) by mouth at bedtime. 90 capsule 2  . diphenhydrAMINE (BENYLIN) 12.5 MG/5ML syrup Take 12.5 mg by mouth at bedtime as needed for sleep.     Marland Kitchen KERYDIN 5 % SOLN Apply 1 application topically daily. Apply to toes    . lisinopril-hydrochlorothiazide (PRINZIDE,ZESTORETIC) 20-12.5 MG per tablet Take 1 tablet by mouth every morning. 45 tablet 6  . LORazepam (ATIVAN) 0.5 MG tablet Take 0.5 mg by mouth daily as needed for anxiety. When heart races    . metoprolol succinate (TOPROL-XL) 25 MG 24 hr tablet Take 1 tablet (25 mg total) by mouth 2 (two) times daily. 180 tablet 2  . NON FORMULARY CPAP MACHINE    . Probiotic Product (PROBIOTIC DAILY PO) Take 1 capsule by mouth daily.    . rosuvastatin (CRESTOR) 10 MG tablet Take 1 tablet (10 mg total) by mouth every evening. 90 tablet 3  . [DISCONTINUED] hyoscyamine (LEVSIN SL) 0.125 MG SL tablet      No current facility-administered medications on file prior to visit.    Allergies  Allergen Reactions  .  Betadine [Povidone Iodine] Other (See Comments)    Causes stinging of skin   . Oxycodone Other (See Comments)    nightmares    Past Medical History  Diagnosis Date  . Hypertension   . Hyperlipidemia   . S/P cardiac catheterization 1995 and 1998    a. following abnormal stress tests. Both reportedly normal;  b. 02/2013 nl myoview.  . Anxiety   . 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 Nocturia associated with benign prostatic hypertrophy 03/21/2013  . Palpitations   . Edema of right foot     ? muscle problem to be addressed after left knee TKA on 01/06/2015 per patient   . History of kidney stones   . Arthritis     Knee , Neck  . Colon cancer [facility])     a. s/p colon resection in 2004 and 2002  . Complication of anesthesia   . Sleep apnea with use of continuous positive airway pressure (CPAP)     cpap- 4 setting , temperature setting of 72  humidity only wears 4 hours per patient    Past Surgical History  Procedure Laterality Date  . Cardiac catheterization  07/04/1997  . Colon resection  2004    x 2 per patient   .  L4 discectomy    . Elbow surgery Left 2011    INFECTED OLECRANON BURSITIS SURGERY  . Transurethral resection of prostate    . Retinal detachment surgery  1998  . Septic joint Left     Elbow  . Total knee arthroplasty Left 01/06/2015    Procedure: LEFT TOTAL KNEE ARTHROPLASTY;  Surgeon: Gaynelle Arabian, MD;  Location: [facility];  Service: Orthopedics;  Laterality: Left;  . Colonoscopy    . Inguinal hernia repair Right 06/03/2015    Procedure: REPAIR RIGHT INGUINAL HERNIA;  Surgeon: Georganna Skeans, MD;  Location: [place];  Service: General;  Laterality: Right;  . Insertion of mesh Right 06/03/2015    Procedure: INSERTION OF MESH;  Surgeon: Georganna Skeans, MD;  Location: [place];  Service: General;  Laterality: Right;  . Total knee arthroplasty Right 10/03/2015    Procedure: RIGHT TOTAL KNEE ARTHROPLASTY;  Surgeon: Gaynelle Arabian, MD;  Location: [facility];   Service: Orthopedics;  Laterality: Right;    History  Smoking status  . Never Smoker   Smokeless tobacco  . Never Used    History  Alcohol Use  . 10.2 oz/week  . 8 Glasses of wine, 8 Shots of liquor, 1 Standard drinks or equivalent per week    Comment: each night 1 glass    Family History  Problem Relation Age of Onset  . Emphysema Mother   . Heart attack Father   . Hypertension Father   . Hypertension Other   . High Cholesterol Other     Review of Systems: As noted in HPI. All other systems are reviewed and are negative.  Physical Exam: BP 104/66 mmHg  Pulse 54  Ht 5\' 9"  (1.753 m)  Wt 74.39 kg (164 lb)  BMI 24.21 kg/m2 He is a pleasant white male in no acute distress. His HEENT exam is unremarkable. He has no JVD or bruits. Lungs are clear. Cardiac exam reveals a regular rate and rhythm without gallop, murmur, or click. Abdomen is soft and nontender. He has no edema. Pedal pulses are good.  LABORATORY DATA: Lab Results  Component Value Date   WBC 8.6 10/06/2015   HGB 10.2* 10/06/2015   HCT 31.1* 10/06/2015   PLT 138* 10/06/2015   GLUCOSE 132* 10/05/2015   CHOL 160 04/28/2015   TRIG 150* 04/28/2015   HDL 52 04/28/2015   LDLCALC 78 04/28/2015   ALT 31 09/25/2015   AST 26 09/25/2015   NA 139 10/05/2015   K 4.5 10/05/2015   CL 106 10/05/2015   CREATININE 0.94 10/05/2015   BUN 16 10/05/2015   CO2 26 10/05/2015   TSH 1.13 10/23/2013   PSA 1.00 03/09/2007   INR 0.99 09/25/2015     Assessment / Plan: 1. Hypertension. Blood pressure is under excellent control currently.   2. Hyperlipidemia, controlled on Crestor and fish oil.  3. Coronary calcification noted incidentally on a CT scan. This was in the mid RCA distribution. Stress Myoview study 03/23/13 was negative for ischemia. Ejection fraction was normal. He is asymptomatic. Continue risk factor modification.  4. Obstructive sleep apnea now on CPAP.

## 2015-12-12 ENCOUNTER — Ambulatory Visit (INDEPENDENT_AMBULATORY_CARE_PROVIDER_SITE_OTHER): Payer: BLUE CROSS/BLUE SHIELD | Admitting: *Deleted

## 2015-12-12 DIAGNOSIS — Z789 Other specified health status: Secondary | ICD-10-CM | POA: Diagnosis not present

## 2015-12-12 DIAGNOSIS — Z23 Encounter for immunization: Secondary | ICD-10-CM

## 2015-12-12 DIAGNOSIS — Z7189 Other specified counseling: Secondary | ICD-10-CM | POA: Diagnosis not present

## 2015-12-12 DIAGNOSIS — Z7184 Encounter for health counseling related to travel: Secondary | ICD-10-CM

## 2015-12-12 MED ORDER — TYPHOID VI POLYSACCHARIDE VACC 25 MCG/0.5ML IM SOLN: 0.5000 mL | Freq: Once | INTRAMUSCULAR | Status: DC

## 2015-12-17 ENCOUNTER — Ambulatory Visit: Payer: BLUE CROSS/BLUE SHIELD | Admitting: Cardiology

## 2016-01-12 ENCOUNTER — Ambulatory Visit: Payer: BLUE CROSS/BLUE SHIELD | Admitting: Cardiology

## 2016-01-15 ENCOUNTER — Telehealth: Payer: Self-pay | Admitting: Cardiology

## 2016-01-15 NOTE — Telephone Encounter (Signed)
New message     Patient calling traveling out of the country for 2 weeks - [place] high altitude - wants to make sure of medication and altitude

## 2016-01-15 NOTE — Telephone Encounter (Signed)
Left msg for patient to call. 

## 2016-01-19 NOTE — Telephone Encounter (Signed)
msg left for pt to call. 

## 2016-03-04 ENCOUNTER — Telehealth: Payer: Self-pay

## 2016-03-04 NOTE — Telephone Encounter (Signed)
Received surgical clearance from [facility].Dr.Jordan advised ok to hold Eliquis 72 hours prior to upcoming surgery.

## 2016-03-10 ENCOUNTER — Other Ambulatory Visit: Payer: Self-pay | Admitting: Orthopedic Surgery

## 2016-03-18 DIAGNOSIS — M76829 Posterior tibial tendinitis, unspecified leg: Secondary | ICD-10-CM | POA: Insufficient documentation

## 2016-04-12 ENCOUNTER — Ambulatory Visit: Payer: BLUE CROSS/BLUE SHIELD | Admitting: Neurology

## 2016-04-15 ENCOUNTER — Other Ambulatory Visit: Payer: Self-pay | Admitting: Cardiology

## 2016-04-20 ENCOUNTER — Ambulatory Visit (INDEPENDENT_AMBULATORY_CARE_PROVIDER_SITE_OTHER): Payer: BLUE CROSS/BLUE SHIELD | Admitting: Neurology

## 2016-04-20 ENCOUNTER — Encounter: Payer: Self-pay | Admitting: Neurology

## 2016-04-20 VITALS — BP 109/58 | HR 64 | Resp 20 | Ht 69.0 in | Wt 177.0 lb

## 2016-04-20 DIAGNOSIS — R351 Nocturia: Secondary | ICD-10-CM

## 2016-04-20 DIAGNOSIS — Z9989 Dependence on other enabling machines and devices: Principal | ICD-10-CM

## 2016-04-20 DIAGNOSIS — G4733 Obstructive sleep apnea (adult) (pediatric): Secondary | ICD-10-CM | POA: Diagnosis not present

## 2016-04-20 NOTE — Progress Notes (Signed)
[facility]  Provider:  Dr Yandel [name] Referring Provider: [name], MD Primary Care Physician:  Robert Redwood, MD  Chief Complaint  Patient presents with  . Follow-up    cpap going well     HPI:  Robert [name] is a 70 y.o. male here as a revisit for Insomnia and OSA , referred originally  from Robert. Jasmine [name] , Urologist . Robert.  Brigitte [name] is this patient's  PCP.   Robert [name] begun having fragmented sleep and finally  would wake up 5-7 times at night to relieve himself. He tried Flomax without success and then by the recently changed urologists and 2013. Robert. Jasmine [name] finally suspected that sleep apnea may be a contributing cause. He also had seen a dentist about the same time noticed him falling asleep in the dental chair and urgent to get an workup for obstructive sleep apnea. Robert. Brigitte [name], his primary care physician ordered a sleep study and the patient tested positive for apnea he was titrated to CPAP in a split night procedure the study was performed. The split night procedure took place on 08-16-12 the patient's BMI at that time was 25 his Epworth sleepiness score 4/24 points his neck circumference 15-1/2 inches. He and was to exhalatory at one point. Apnea hypotony index was 18.5 and RDI 18.9 there was no strong REM complement the supine complement. Lowest desaturation was 78% supine REM sleep was 32.8 minutes of desaturation. The patient was titrated to 6 cm water pressure as an AHI of less than 5. He then underwent a  Auto- titration and compliance report through the [name] 2013 into January 2040,  he used his machine 6 hours 30 minutes at night was 100% compliant , his  AHI  was 2.9 in late January 2014 .Patient uses CPAP at 4 to 8 cm water autoset with a residual AHI of 1.7 and average user time 5 hours 27 minutes. Low air leak, 90 days of 93% compliance.He still has nocturia,  TURP procedure 1995, Epworth 5 and FSS 30 points, Patient has found a 'cocktail " that helps him  to sleep , using an antihistamine along with prescription sleep aid.   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  He will bring her CPAP machine [facility]. He has been having nightmares on unisom. He is treated with cialis for BPH(!). He is 80% better in terms of Nocturia.  He was on a [place] and woke up 3 times at night, but this was better than at home, and he thinks it was due to his higher level of physicial activity. Robert [name] is used PRN.  I would feel unconcerned about him using a benzodiazepine for the rest of his life.  Interval history from 04/20/2016. With the couple of just returned from his daughter's wedding, with a very exciting event he is 97% compliance with CPAP use uses an AutoSet between 4 and 8 cm water with 27 m EPR his residual AHI is close to ideal at 1.4 per hour. No adjustments need to be made. He has been losing weight , plans to retire this Friday.  He tore his right posterior tibial tendon and plans to undergo surgery.    Review of Systems: Out of a complete 14 system review, the patient complains of only the following symptoms, and all other reviewed systems are negative. Nocturia  is down  from 6-7 times at night to 3 times.  Epworth is  4 Epworth Sleepiness Scale today is score today is 4 points, and  FSS 29 points, and GDS  depression score zero  Point.  No longer nightmares , reduced Nocturia- but not resolved .   Dicylomine as sleep aid permitted, takes Malatonin.  Social History   Social History  . Marital status: Married    Spouse name: Robert [name]  . Number of children: 3  . Years of education: Ph.D   Occupational History  . ADMIN Designer, [employer]   Social History Main Topics  . Smoking status: Never Smoker  . Smokeless tobacco: Never Used  . Alcohol use 10.2 oz/week    8 Glasses of wine, 8 Shots of liquor, 1 Standard drinks or equivalent per week      Comment: each night 1 glass  . Drug use: No  . Sexual activity: Yes   Other Topics Concern  . Not on file   Social History Narrative   Sleep apnea 28 AHI was reduced to 3.4- set to  7 cm water pressure on nasal mask eson or wisp.  He sleep  nonsupine, less nocturia.  Tennis ball method explained.  Patient does not want machine set to a certain pressure.     Downloaded CMS compliance s etsablsihed .  Insomnia not improved further - will try seroquel as  medication approach and discussed behaviour therapy approach .   send for n beahiour therapy and started trial of SEROQUEL>  25 minute visit including CMS compliance and downloading.    Patient is married Robert [name]) and lives at home with his wife.   Patient has three children and his wife has three children.   Patient is working full-time.   Patient has a Ph.D.   Patient is right-handed.   Patient drinks about 21 oz of coffee daily.          Family History  Problem Relation Age of Onset  . Emphysema Mother   . Heart attack Father   . Hypertension Father   . Hypertension Other   . High Cholesterol Other     Past Medical History:  Diagnosis Date  . Anxiety   . Arthritis    Knee , Neck  . Colon cancer [facility])    a. s/p colon resection in 2004 and 2002  . Complication of anesthesia   . Edema of right foot    ? muscle problem to be addressed after left knee TKA on 01/06/2015 per patient   . History of kidney stones   . Hyperlipidemia   . Hypertension   . 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 Nocturia associated with benign prostatic hypertrophy 03/21/2013  . Palpitations   . S/P cardiac catheterization 1995 and 1998   a. following abnormal stress tests. Both reportedly normal;  b. 02/2013 nl myoview.  . Sleep apnea with use of continuous positive airway pressure (CPAP)    cpap- 4 setting , temperature setting of 72  humidity only wears 4 hours per patient    Past Surgical History:  Procedure Laterality  Date  . CARDIAC CATHETERIZATION  07/04/1997  . COLON RESECTION  2004   x 2 per patient   . COLONOSCOPY    . ELBOW SURGERY Left 2011   INFECTED OLECRANON BURSITIS SURGERY  . INGUINAL HERNIA REPAIR Right 06/03/2015   Procedure: REPAIR RIGHT INGUINAL HERNIA;  Surgeon: Georganna Skeans, MD;  Location: [place];  Service: General;  Laterality: Right;  . INSERTION OF MESH  Right 06/03/2015   Procedure: INSERTION OF MESH;  Surgeon: Georganna Skeans, MD;  Location: [place];  Service: General;  Laterality: Right;  . L4 DISCECTOMY    . RETINAL DETACHMENT SURGERY  1998  . SEPTIC JOINT Left    Elbow  . TOTAL KNEE ARTHROPLASTY Left 01/06/2015   Procedure: LEFT TOTAL KNEE ARTHROPLASTY;  Surgeon: Gaynelle Arabian, MD;  [facility];  Service: Orthopedics;  Laterality: Left;  . TOTAL KNEE ARTHROPLASTY Right 10/03/2015   Procedure: RIGHT TOTAL KNEE ARTHROPLASTY;  Surgeon: Gaynelle Arabian, MD;  [facility]: [facility];  Service: Orthopedics;  Laterality: Right;  . TRANSURETHRAL RESECTION OF PROSTATE      Current Outpatient Prescriptions  Medication Sig Dispense Refill  . amoxicillin (AMOXIL) 500 MG capsule TAKE 4 CAPSULES 1 HOUR BEFORE DENTAL APPOINTMENT  2  . CIALIS 5 MG tablet Take 5 mg by mouth every evening.     . diltiazem (CARDIZEM CD) 180 MG 24 hr capsule Take 1 capsule (180 mg total) by mouth at bedtime. 90 capsule 2  . diphenhydrAMINE (BENYLIN) 12.5 MG/5ML syrup Take 12.5 mg by mouth at bedtime as needed for sleep.     Marland Kitchen KERYDIN 5 % SOLN Apply 1 application topically daily. Apply to toes    . lisinopril-hydrochlorothiazide (PRINZIDE,ZESTORETIC) 20-12.5 MG tablet TAKE 1 TABLET BY MOUTH EVERY MORNING 45 tablet 2  . Robert [name] (ATIVAN) 0.5 MG tablet Take 0.5 mg by mouth daily as needed for anxiety. When heart races    . metoprolol succinate (TOPROL-XL) 25 MG 24 hr tablet Take 1 tablet (25 mg total) by mouth 2 (two) times daily. 180 tablet 2  . NON FORMULARY CPAP MACHINE    . Probiotic Product (PROBIOTIC DAILY PO) Take 1  capsule by mouth daily.    . rosuvastatin (CRESTOR) 10 MG tablet Take 1 tablet (10 mg total) by mouth every evening. 90 tablet 3   No current facility-administered medications for this visit.     Allergies as of 04/20/2016 - Review Complete 04/20/2016  Allergen Reaction Noted  . Betadine [povidone iodine] Other (See Comments) 12/31/2014  . Oxycodone Other (See Comments) 10/03/2015    Vitals: BP (!) 109/58   [name] 64   Resp 20   Ht 5\' 9"  (1.753 m)   Wt 177 lb (80.3 kg)   BMI 26.14 kg/m  Last Weight:  Wt Readings from Last 1 Encounters:  04/20/16 177 lb (80.3 kg)   Last Height:   Ht Readings from Last 1 Encounters:  04/20/16 5\' 9"  (1.753 m)   Physical exam:  General: The patient is awake, alert and appears not in acute distress. The patient is well groomed. Head: Normocephalic, atraumatic. Neck is supple. Mallampati 2 , neck circumference:16. No retrognathia.  Cardiovascular:  Regular rate and rhythm , without  murmurs or carotid bruit, and without distended neck veins. Respiratory: Lungs are clear to auscultation. Skin:  Without evidence of edema, or rash Trunk: BMI is  normal posture.  Neurologic exam : The patient is awake and alert, oriented to place and time.  Speech is fluent without  dysarthria, dysphonia or aphasia. Mood and affect are appropriate.  Cranial nerves: Pupils are equal and briskly reactive to light. Funduscopic exam without evidence of pallor or edema. Extraocular movements  in vertical and horizontal planes intact and without nystagmus. Visual fields by finger perimetry are intact. Hearing to finger rub intact.  Facial sensation intact to fine touch. Facial motor strength is symmetric and tongue and uvula move midline. Motor exam:  Normal tone and normal muscle bulk . Sensory:  Fine touch, pinprick and vibration were tested in all extremities. Proprioception is  normal.  Assessment:  After physical and neurologic examination, review of laboratory  studies, imaging, neurophysiology testing and pre-existing records, assessment :  OSA is best controlled, there is no further improvement likely.    He has had maximum benefit of apnea treatment on nocturia, insomnia .   Will follow yearly .     Estel Scholze, MD

## 2016-04-22 ENCOUNTER — Ambulatory Visit: Payer: BLUE CROSS/BLUE SHIELD | Admitting: Neurology

## 2016-04-23 ENCOUNTER — Encounter (HOSPITAL_BASED_OUTPATIENT_CLINIC_OR_DEPARTMENT_OTHER): Payer: Self-pay | Admitting: *Deleted

## 2016-04-27 ENCOUNTER — Encounter (HOSPITAL_BASED_OUTPATIENT_CLINIC_OR_DEPARTMENT_OTHER)
Admission: RE | Admit: 2016-04-27 | Discharge: 2016-04-27 | Disposition: A | Discharge status: Home / Self Care | Payer: Medicare Other | Source: Ambulatory Visit | Attending: Orthopedic Surgery | Admitting: Orthopedic Surgery

## 2016-04-27 DIAGNOSIS — M6701 Short Achilles tendon (acquired), right ankle: Secondary | ICD-10-CM | POA: Diagnosis not present

## 2016-04-27 DIAGNOSIS — G4730 Sleep apnea, unspecified: Secondary | ICD-10-CM | POA: Diagnosis not present

## 2016-04-27 DIAGNOSIS — X58XXXA Exposure to other specified factors, initial encounter: Secondary | ICD-10-CM | POA: Diagnosis not present

## 2016-04-27 DIAGNOSIS — M2141 Flat foot [pes planus] (acquired), right foot: Secondary | ICD-10-CM | POA: Diagnosis not present

## 2016-04-27 DIAGNOSIS — Z85038 Personal history of other malignant neoplasm of large intestine: Secondary | ICD-10-CM | POA: Diagnosis not present

## 2016-04-27 DIAGNOSIS — Z7982 Long term (current) use of aspirin: Secondary | ICD-10-CM | POA: Diagnosis not present

## 2016-04-27 DIAGNOSIS — G629 Polyneuropathy, unspecified: Secondary | ICD-10-CM | POA: Diagnosis not present

## 2016-04-27 DIAGNOSIS — S93611A Sprain of tarsal ligament of right foot, initial encounter: Secondary | ICD-10-CM | POA: Diagnosis not present

## 2016-04-27 DIAGNOSIS — Z96653 Presence of artificial knee joint, bilateral: Secondary | ICD-10-CM | POA: Diagnosis not present

## 2016-04-27 DIAGNOSIS — Z79899 Other long term (current) drug therapy: Secondary | ICD-10-CM | POA: Diagnosis not present

## 2016-04-27 DIAGNOSIS — F419 Anxiety disorder, unspecified: Secondary | ICD-10-CM | POA: Diagnosis not present

## 2016-04-27 DIAGNOSIS — E785 Hyperlipidemia, unspecified: Secondary | ICD-10-CM | POA: Diagnosis not present

## 2016-04-27 DIAGNOSIS — M76821 Posterior tibial tendinitis, right leg: Secondary | ICD-10-CM | POA: Diagnosis present

## 2016-04-27 DIAGNOSIS — I10 Essential (primary) hypertension: Secondary | ICD-10-CM | POA: Diagnosis not present

## 2016-04-27 LAB — BASIC METABOLIC PANEL
ANION GAP: 6 (ref 5–15)
BUN: 21 mg/dL — ABNORMAL HIGH (ref 6–20)
CO2: 28 mmol/L (ref 22–32)
Calcium: 10.8 mg/dL — ABNORMAL HIGH (ref 8.9–10.3)
Chloride: 102 mmol/L (ref 101–111)
Creatinine, Ser: 1.04 mg/dL (ref 0.61–1.24)
GFR calc Af Amer: >60 mL/min (ref >60)
Glucose, Bld: 89 mg/dL (ref 65–99)
POTASSIUM: 4.3 mmol/L (ref 3.5–5.1)
SODIUM: 136 mmol/L (ref 135–145)

## 2016-04-28 NOTE — Anesthesia Preprocedure Evaluation (Addendum)
Anesthesia Evaluation  Patient identified by MRN, date of birth, ID band Patient awake    Reviewed: Allergy & Precautions, NPO status , Patient's Chart, lab work & pertinent test results  History of Anesthesia Complications Negative for: history of anesthetic complications  Airway Mallampati: II  TM Distance: >3 FB Neck ROM: Full    Dental no notable dental hx. (+) Dental Advisory Given   Pulmonary sleep apnea ,    Pulmonary exam normal breath sounds clear to auscultation       Cardiovascular hypertension, Pt. on medications and Pt. on home beta blockers + CAD  Normal cardiovascular exam Rhythm:Regular Rate:Normal     Neuro/Psych PSYCHIATRIC DISORDERS Anxiety negative neurological ROS     GI/Hepatic negative GI ROS, Neg liver ROS,   Endo/Other  negative endocrine ROS  Renal/GU negative Renal ROS  negative genitourinary   Musculoskeletal  (+) Arthritis ,   Abdominal   Peds negative pediatric ROS (+)  Hematology negative hematology ROS (+)   Anesthesia Other Findings   Reproductive/Obstetrics negative OB ROS                            Anesthesia Physical Anesthesia Plan  ASA: III  Anesthesia Plan: General   Post-op Pain Management: GA combined w/ Regional for post-op pain   Induction: Intravenous  Airway Management Planned: LMA  Additional Equipment:   Intra-op Plan:   Post-operative Plan: Extubation in OR  Informed Consent: I have reviewed the patients History and Physical, chart, labs and discussed the procedure including the risks, benefits and alternatives for the proposed anesthesia with the patient or authorized representative who has indicated his/her understanding and acceptance.   Dental advisory given  Plan Discussed with: CRNA  Anesthesia Plan Comments:        Anesthesia Quick Evaluation

## 2016-04-29 ENCOUNTER — Ambulatory Visit (HOSPITAL_BASED_OUTPATIENT_CLINIC_OR_DEPARTMENT_OTHER): Payer: Medicare Other | Admitting: Anesthesiology

## 2016-04-29 ENCOUNTER — Encounter (HOSPITAL_BASED_OUTPATIENT_CLINIC_OR_DEPARTMENT_OTHER)
Admission: RE | Disposition: A | Discharge status: Home / Self Care | Payer: Self-pay | Source: Ambulatory Visit | Attending: Orthopedic Surgery

## 2016-04-29 ENCOUNTER — Encounter (HOSPITAL_BASED_OUTPATIENT_CLINIC_OR_DEPARTMENT_OTHER): Payer: Self-pay | Admitting: *Deleted

## 2016-04-29 ENCOUNTER — Ambulatory Visit (HOSPITAL_BASED_OUTPATIENT_CLINIC_OR_DEPARTMENT_OTHER)
Admission: RE | Admit: 2016-04-29 | Discharge: 2016-04-29 | Disposition: A | Discharge status: Home / Self Care | Payer: Medicare Other | Source: Ambulatory Visit | Attending: Orthopedic Surgery | Admitting: Orthopedic Surgery

## 2016-04-29 DIAGNOSIS — S93611A Sprain of tarsal ligament of right foot, initial encounter: Secondary | ICD-10-CM | POA: Insufficient documentation

## 2016-04-29 DIAGNOSIS — Z79899 Other long term (current) drug therapy: Secondary | ICD-10-CM | POA: Insufficient documentation

## 2016-04-29 DIAGNOSIS — M2141 Flat foot [pes planus] (acquired), right foot: Secondary | ICD-10-CM | POA: Insufficient documentation

## 2016-04-29 DIAGNOSIS — M6701 Short Achilles tendon (acquired), right ankle: Secondary | ICD-10-CM | POA: Diagnosis not present

## 2016-04-29 DIAGNOSIS — Z96653 Presence of artificial knee joint, bilateral: Secondary | ICD-10-CM | POA: Insufficient documentation

## 2016-04-29 DIAGNOSIS — F419 Anxiety disorder, unspecified: Secondary | ICD-10-CM | POA: Insufficient documentation

## 2016-04-29 DIAGNOSIS — M76821 Posterior tibial tendinitis, right leg: Secondary | ICD-10-CM | POA: Insufficient documentation

## 2016-04-29 DIAGNOSIS — Z85038 Personal history of other malignant neoplasm of large intestine: Secondary | ICD-10-CM | POA: Insufficient documentation

## 2016-04-29 DIAGNOSIS — I10 Essential (primary) hypertension: Secondary | ICD-10-CM | POA: Insufficient documentation

## 2016-04-29 DIAGNOSIS — Z7982 Long term (current) use of aspirin: Secondary | ICD-10-CM | POA: Insufficient documentation

## 2016-04-29 DIAGNOSIS — E785 Hyperlipidemia, unspecified: Secondary | ICD-10-CM | POA: Insufficient documentation

## 2016-04-29 DIAGNOSIS — G4730 Sleep apnea, unspecified: Secondary | ICD-10-CM | POA: Insufficient documentation

## 2016-04-29 DIAGNOSIS — X58XXXA Exposure to other specified factors, initial encounter: Secondary | ICD-10-CM | POA: Insufficient documentation

## 2016-04-29 DIAGNOSIS — G629 Polyneuropathy, unspecified: Secondary | ICD-10-CM | POA: Insufficient documentation

## 2016-04-29 HISTORY — PX: CALCANEAL OSTEOTOMY: SHX1281

## 2016-04-29 HISTORY — PX: GASTROCNEMIUS RECESSION: SHX863

## 2016-04-29 SURGERY — RECESSION, MUSCLE, GASTROCNEMIUS
Anesthesia: General | Site: Leg Lower | Laterality: Right

## 2016-04-29 MED ORDER — DEXAMETHASONE SODIUM PHOSPHATE 10 MG/ML IJ SOLN
INTRAMUSCULAR | Status: DC | PRN
  Administered 2016-04-29: 10 mg via INTRAVENOUS

## 2016-04-29 MED ORDER — 0.9 % SODIUM CHLORIDE (POUR BTL) OPTIME
TOPICAL | Status: DC | PRN
  Administered 2016-04-29: 400 mL

## 2016-04-29 MED ORDER — SODIUM CHLORIDE 0.9 % IV SOLN: INTRAVENOUS | Status: DC

## 2016-04-29 MED ORDER — CHLORHEXIDINE GLUCONATE 4 % EX LIQD: 60.0000 mL | Freq: Once | CUTANEOUS | Status: DC

## 2016-04-29 MED ORDER — EPHEDRINE 5 MG/ML INJ
INTRAVENOUS | Status: AC
  Filled 2016-04-29: qty 10

## 2016-04-29 MED ORDER — ONDANSETRON HCL 4 MG/2ML IJ SOLN
INTRAMUSCULAR | Status: DC | PRN
  Administered 2016-04-29: 4 mg via INTRAVENOUS

## 2016-04-29 MED ORDER — DEXAMETHASONE SODIUM PHOSPHATE 10 MG/ML IJ SOLN
INTRAMUSCULAR | Status: AC
  Filled 2016-04-29: qty 1

## 2016-04-29 MED ORDER — EPHEDRINE SULFATE 50 MG/ML IJ SOLN
INTRAMUSCULAR | Status: DC | PRN
Start: 2016-04-29 — End: 2016-04-29
  Administered 2016-04-29: 10 mg via INTRAVENOUS

## 2016-04-29 MED ORDER — LACTATED RINGERS IV SOLN
INTRAVENOUS | Status: DC
Start: 2016-04-29 — End: 2016-04-29
  Administered 2016-04-29 (×2): via INTRAVENOUS

## 2016-04-29 MED ORDER — SENNA 8.6 MG PO TABS: 2.0000 | ORAL_TABLET | Freq: Two times a day (BID) | ORAL | 0 refills | Status: DC

## 2016-04-29 MED ORDER — BUPIVACAINE-EPINEPHRINE (PF) 0.5% -1:200000 IJ SOLN
INTRAMUSCULAR | Status: DC | PRN
Start: 2016-04-29 — End: 2016-04-29
  Administered 2016-04-29: 25 mL

## 2016-04-29 MED ORDER — LIDOCAINE HCL (CARDIAC) 20 MG/ML IV SOLN
INTRAVENOUS | Status: DC | PRN
  Administered 2016-04-29: 30 mg via INTRAVENOUS

## 2016-04-29 MED ORDER — LIDOCAINE 2% (20 MG/ML) 5 ML SYRINGE
INTRAMUSCULAR | Status: AC
  Filled 2016-04-29: qty 5

## 2016-04-29 MED ORDER — MIDAZOLAM HCL 2 MG/2ML IJ SOLN
1.0000 mg | INTRAMUSCULAR | Status: DC | PRN
  Administered 2016-04-29: 2 mg via INTRAVENOUS

## 2016-04-29 MED ORDER — PROPOFOL 10 MG/ML IV BOLUS
INTRAVENOUS | Status: DC | PRN
  Administered 2016-04-29: 200 mg via INTRAVENOUS

## 2016-04-29 MED ORDER — ONDANSETRON HCL 4 MG/2ML IJ SOLN: 4.0000 mg | Freq: Once | INTRAMUSCULAR | Status: DC | PRN

## 2016-04-29 MED ORDER — DOCUSATE SODIUM 100 MG PO CAPS: 100.0000 mg | ORAL_CAPSULE | Freq: Two times a day (BID) | ORAL | 0 refills | Status: DC

## 2016-04-29 MED ORDER — ASPIRIN EC 81 MG PO TBEC: 81.0000 mg | DELAYED_RELEASE_TABLET | Freq: Two times a day (BID) | ORAL | 0 refills | Status: DC

## 2016-04-29 MED ORDER — ONDANSETRON HCL 4 MG/2ML IJ SOLN
INTRAMUSCULAR | Status: AC
  Filled 2016-04-29: qty 2

## 2016-04-29 MED ORDER — SCOPOLAMINE 1 MG/3DAYS TD PT72: 1.0000 | MEDICATED_PATCH | Freq: Once | TRANSDERMAL | Status: DC | PRN

## 2016-04-29 MED ORDER — PROPOFOL 10 MG/ML IV BOLUS
INTRAVENOUS | Status: AC
  Filled 2016-04-29: qty 20

## 2016-04-29 MED ORDER — FENTANYL CITRATE (PF) 100 MCG/2ML IJ SOLN: 25.0000 ug | INTRAMUSCULAR | Status: DC | PRN

## 2016-04-29 MED ORDER — GLYCOPYRROLATE 0.2 MG/ML IJ SOLN: 0.2000 mg | Freq: Once | INTRAMUSCULAR | Status: DC | PRN

## 2016-04-29 MED ORDER — CEFAZOLIN SODIUM-DEXTROSE 2-4 GM/100ML-% IV SOLN
2.0000 g | INTRAVENOUS | Status: AC
  Administered 2016-04-29: 2 g via INTRAVENOUS

## 2016-04-29 MED ORDER — FENTANYL CITRATE (PF) 100 MCG/2ML IJ SOLN
INTRAMUSCULAR | Status: AC
  Filled 2016-04-29: qty 2

## 2016-04-29 MED ORDER — HYDROCODONE-ACETAMINOPHEN 5-325 MG PO TABS: 1.0000 | ORAL_TABLET | ORAL | 0 refills | Status: DC | PRN

## 2016-04-29 MED ORDER — MIDAZOLAM HCL 2 MG/2ML IJ SOLN
INTRAMUSCULAR | Status: AC
  Filled 2016-04-29: qty 2

## 2016-04-29 MED ORDER — CEFAZOLIN SODIUM-DEXTROSE 2-4 GM/100ML-% IV SOLN
INTRAVENOUS | Status: AC
  Filled 2016-04-29: qty 100

## 2016-04-29 MED ORDER — FENTANYL CITRATE (PF) 100 MCG/2ML IJ SOLN
50.0000 ug | INTRAMUSCULAR | Status: DC | PRN
  Administered 2016-04-29: 50 ug via INTRAVENOUS

## 2016-04-29 SURGICAL SUPPLY — 94 items
BANDAGE ESMARK 6X9 LF (GAUZE/BANDAGES/DRESSINGS) ×2 IMPLANT
BIT DRILL CANN 3.5MM (DRILL) IMPLANT
BLADE AVERAGE 25X9 (BLADE) IMPLANT
BLADE CCA MICRO SAG (BLADE) IMPLANT
BLADE MICRO SAGITTAL (BLADE) ×3 IMPLANT
BLADE SURG 15 STRL LF DISP TIS (BLADE) ×4 IMPLANT
BLADE SURG 15 STRL SS (BLADE) ×9
BNDG CMPR 9X6 STRL LF SNTH (GAUZE/BANDAGES/DRESSINGS) ×2
BNDG COHESIVE 4X5 TAN STRL (GAUZE/BANDAGES/DRESSINGS) ×3 IMPLANT
BNDG COHESIVE 6X5 TAN STRL LF (GAUZE/BANDAGES/DRESSINGS) ×3 IMPLANT
BNDG ESMARK 6X9 LF (GAUZE/BANDAGES/DRESSINGS) ×3
BUR EGG 3PK/BX (BURR) IMPLANT
CANISTER SUCT 1200ML W/VALVE (MISCELLANEOUS) ×3 IMPLANT
CHLORAPREP W/TINT 26ML (MISCELLANEOUS) ×3 IMPLANT
COVER BACK TABLE 60X90IN (DRAPES) ×3 IMPLANT
CUFF TOURNIQUET SINGLE 34IN LL (TOURNIQUET CUFF) ×3 IMPLANT
DECANTER SPIKE VIAL GLASS SM (MISCELLANEOUS) IMPLANT
DRAPE EXTREMITY T 121X128X90 (DRAPE) ×3 IMPLANT
DRAPE OEC MINIVIEW 54X84 (DRAPES) ×3 IMPLANT
DRAPE U-SHAPE 47X51 STRL (DRAPES) ×3 IMPLANT
DRILL CANN 3.5MM (DRILL) ×3
DRSG MEPITEL 4X7.2 (GAUZE/BANDAGES/DRESSINGS) ×4 IMPLANT
DRSG PAD ABDOMINAL 8X10 ST (GAUZE/BANDAGES/DRESSINGS) ×6 IMPLANT
ELECT REM PT RETURN 9FT ADLT (ELECTROSURGICAL) ×3
ELECTRODE REM PT RTRN 9FT ADLT (ELECTROSURGICAL) ×2 IMPLANT
GAUZE SPONGE 4X4 12PLY STRL (GAUZE/BANDAGES/DRESSINGS) ×3 IMPLANT
GLOVE BIO SURGEON STRL SZ8 (GLOVE) ×3 IMPLANT
GLOVE BIOGEL PI IND STRL 7.0 (GLOVE) IMPLANT
GLOVE BIOGEL PI IND STRL 8 (GLOVE) ×4 IMPLANT
GLOVE BIOGEL PI INDICATOR 7.0 (GLOVE) ×2
GLOVE BIOGEL PI INDICATOR 8 (GLOVE) ×2
GLOVE ECLIPSE 6.5 STRL STRAW (GLOVE) ×1 IMPLANT
GLOVE ECLIPSE 7.5 STRL STRAW (GLOVE) ×3 IMPLANT
GLOVE EXAM NITRILE MD LF STRL (GLOVE) IMPLANT
GOWN STRL REUS W/ TWL LRG LVL3 (GOWN DISPOSABLE) ×2 IMPLANT
GOWN STRL REUS W/ TWL XL LVL3 (GOWN DISPOSABLE) ×4 IMPLANT
GOWN STRL REUS W/TWL LRG LVL3 (GOWN DISPOSABLE) ×3
GOWN STRL REUS W/TWL XL LVL3 (GOWN DISPOSABLE) ×6
GUIDEWIRE 1.1X6IN (WIRE) ×1 IMPLANT
K-WIRE 1.8 (WIRE) ×9
K-WIRE FX200X1.8XTROC TIP (WIRE) ×6
KWIRE FX200X1.8XTROC TIP (WIRE) IMPLANT
NDL HYPO 25X1 1.5 SAFETY (NEEDLE) IMPLANT
NDL SUT 6 .5 CRC .975X.05 MAYO (NEEDLE) ×2 IMPLANT
NEEDLE HYPO 22GX1.5 SAFETY (NEEDLE) IMPLANT
NEEDLE HYPO 25X1 1.5 SAFETY (NEEDLE) IMPLANT
NEEDLE MAYO TAPER (NEEDLE) ×3
NS IRRIG 1000ML POUR BTL (IV SOLUTION) ×3 IMPLANT
PACK BASIN DAY SURGERY FS (CUSTOM PROCEDURE TRAY) ×3 IMPLANT
PAD CAST 4YDX4 CTTN HI CHSV (CAST SUPPLIES) ×2 IMPLANT
PADDING CAST ABS 4INX4YD NS (CAST SUPPLIES)
PADDING CAST ABS COTTON 4X4 ST (CAST SUPPLIES) IMPLANT
PADDING CAST COTTON 4X4 STRL (CAST SUPPLIES) ×3
PADDING CAST COTTON 6X4 STRL (CAST SUPPLIES) ×3 IMPLANT
PASSER SUT SWANSON 36MM LOOP (INSTRUMENTS) ×1 IMPLANT
PENCIL BUTTON HOLSTER BLD 10FT (ELECTRODE) ×3 IMPLANT
PIN GUIDE DRILL TIP 2.8X300 (DRILL) ×2 IMPLANT
RETRIEVER SUT HEWSON (MISCELLANEOUS) IMPLANT
SANITIZER HAND PURELL 535ML FO (MISCELLANEOUS) ×3 IMPLANT
SCREW CANN 5.0X30MM (Screw) ×1 IMPLANT
SCREW CANN 5.0X40MM (Screw) ×1 IMPLANT
SCREW CANNULATED 6.5X50 (Screw) ×1 IMPLANT
SCREW CANNULATED 6.5X55 KNEE (Screw) ×1 IMPLANT
SCREW F/T CANN.5.0X40MM (Screw) ×1 IMPLANT
SCREW P/T IMPL CANN 5.0X36 (Screw) ×1 IMPLANT
SHEET MEDIUM DRAPE 40X70 STRL (DRAPES) ×3 IMPLANT
SLEEVE SCD COMPRESS KNEE MED (MISCELLANEOUS) ×3 IMPLANT
SPLINT FAST PLASTER 5X30 (CAST SUPPLIES) ×20
SPLINT PLASTER CAST FAST 5X30 (CAST SUPPLIES) ×40 IMPLANT
SPONGE LAP 18X18 X RAY DECT (DISPOSABLE) ×3 IMPLANT
STOCKINETTE 6  STRL (DRAPES) ×1
STOCKINETTE 6 STRL (DRAPES) ×2 IMPLANT
SUCTION FRAZIER HANDLE 10FR (MISCELLANEOUS) ×1
SUCTION TUBE FRAZIER 10FR DISP (MISCELLANEOUS) ×2 IMPLANT
SUT 2 FIBERLOOP 20 STRT BLUE (SUTURE)
SUT BONE WAX W31G (SUTURE) IMPLANT
SUT ETHIBOND 2 OS 4 DA (SUTURE) IMPLANT
SUT ETHILON 3 0 PS 1 (SUTURE) ×5 IMPLANT
SUT FIBERWIRE #2 38 T-5 BLUE (SUTURE)
SUT FIBERWIRE 2-0 18 17.9 3/8 (SUTURE)
SUT MERSILENE 2.0 SH NDLE (SUTURE) IMPLANT
SUT MNCRL AB 3-0 PS2 18 (SUTURE) ×4 IMPLANT
SUT VIC AB 0 SH 27 (SUTURE) ×3 IMPLANT
SUT VIC AB 2-0 SH 27 (SUTURE) ×3
SUT VIC AB 2-0 SH 27XBRD (SUTURE) IMPLANT
SUTURE 2 FIBERLOOP 20 STRT BLU (SUTURE) IMPLANT
SUTURE FIBERWR #2 38 T-5 BLUE (SUTURE) IMPLANT
SUTURE FIBERWR 2-0 18 17.9 3/8 (SUTURE) IMPLANT
SYR BULB 3OZ (MISCELLANEOUS) ×3 IMPLANT
SYR CONTROL 10ML LL (SYRINGE) IMPLANT
TOWEL OR 17X24 6PK STRL BLUE (TOWEL DISPOSABLE) ×7 IMPLANT
TUBE CONNECTING 20X1/4 (TUBING) ×3 IMPLANT
UNDERPAD 30X30 (UNDERPADS AND DIAPERS) ×3 IMPLANT
YANKAUER SUCT BULB TIP NO VENT (SUCTIONS) IMPLANT

## 2016-04-29 NOTE — Op Note (Signed)
Robert [name], Robert [name] NO.:  [id number]  MEDICAL RECORD NO.:  [id number]  LOCATION:                                 FACILITY:  PHYSICIAN:  Wylene Simmer, MD        DATE OF BIRTH:  Mar 30, 1946  DATE OF PROCEDURE:  04/29/2016 DATE OF DISCHARGE:                              OPERATIVE REPORT   PREOPERATIVE DIAGNOSES: 1. Right posterior tibial tendon dysfunction, stage IIb. 2. Tight right heel cord.  POSTOPERATIVE DIAGNOSES: 1. Right posterior tibial tendon dysfunction, stage IIb. 2. Tight right heel cord. 3. Torn and incompetent spring ligament.  PROCEDURES: 1. Right gastrocnemius recession through a separate incision. 2. Right medializing calcaneal osteotomy through a separate incision. 3. Right posterior tibial tendon tenolysis through a separate     incision. 4. Right talonavicular joint arthrodesis. 5. Three view radiographs of the right foot.  SURGEON:  Wylene Simmer, MD.  ASSISTANT:  Mechele Claude, PA-C.  ANESTHESIA:  General, regional.  ESTIMATED BLOOD LOSS:  Minimal.  TOURNIQUET TIME:  90 minutes at 250 mmHg.  COMPLICATIONS:  None apparent.  DISPOSITION:  Extubated, awake, and stable to recovery.  INDICATIONS FOR PROCEDURE:  The patient is a 70 year old male, who has had a painful flatfoot deformity on the right, progressive over the last year.  He has failed nonoperative treatment to date including activity modification, oral anti-inflammatories, shoe wear modification, custom orthotics, and physical therapy.  He presents today for operative treatment of this painful condition.  He understands the risks and benefits, the alternative treatment options, and elects surgical treatment.  He specifically understands risks of bleeding, infection, nerve damage, blood clots, need for additional surgery, continued pain, recurrence of deformity, nonunion, amputation, and death.  PROCEDURE IN DETAIL:  After preoperative consent was obtained and  the correct operative site was identified, the patient was brought to the operating room and placed supine on the operating table.  General anesthesia was induced.  Preoperative antibiotics were administered. Surgical time-out was taken.  The right lower extremity was prepped and draped in standard sterile fashion with a tourniquet around the thigh. The extremity was exsanguinated, and the tourniquet was inflated to 250 mmHg.  A longitudinal incision was made over the medial calf.  Sharp dissection was carried down through the skin and subcutaneous tissue. Saphenous nerve and vein were protected.  The gastrocnemius tendon was identified.  The plantaris and gastrocnemius tendons were divided under direct vision from medial to lateral taking care to protect the sural nerve posteriorly.  The wound was irrigated and closed with Monocryl and nylon.  The ankle was noted to dorsiflex approximately 20 degrees with the knee extended.  Attention was then turned to the lateral aspect of the hindfoot where an oblique incision was made over the calcaneus.  Sharp dissection was carried down through the skin and subcutaneous tissue.  Appropriate location of the osteotomy was confirmed with radiographs.  The osteotomy was made with the oscillating saw taking care to protect adjacent soft tissues.  The tuberosity was translated medially, approximately 1 cm. The osteotomy was fixed with two 6.5 mm, partially threaded Biomet cannulated screws.  The lateral wall bone was smooth and impacted.  The  wound was irrigated and closed with nylon.  The heel wound was closed with nylon.  Attention was then turned to the medial aspect of the hindfoot where a curvilinear incision was made from the navicular to the medial malleolus along the course of the posterior tibial tendon.  Sharp dissection was carried down through the skin and subcutaneous tissues.  Care was taken to protect branches of the saphenous nerve.   The posterior tibial tendon sheath was opened.  The tendon itself was intact, but had considerable degenerative change.  This was debrided sharply with a scalpel.  Once the tenolysis was complete, the spring ligament was inspected.  It was noted to be completely ruptured from the dorsomedial talonavicular joint all the way around to the anterior facet of the subtalar joint.  There was no remaining competent portion of the spring ligament.  Based on this complete loss of the static stabilizer of the arch, the decision was made to proceed to the talonavicular joint arthrodesis in lieu of FDL transfer and medial cuneiform osteotomy.  A longitudinal incision was made over the dorsum of the talonavicular joint.  Sharp dissection was carried down through the skin and subcutaneous tissue.  The interval between the EHL and tibialis anterior was developed.  Neurovascular bundle was mobilized and translated laterally.  The talonavicular joint was opened dorsally.  The articular cartilage and subchondral bone were removed on both sides of the joint.  The wound was irrigated copiously. A 2.5 mm drill bit was then used to perforate both sides of the joint, and a quarter-inch osteotome was used to further break up the joint surfaces.  The joint was reduced.  K-wires for the Biomet 5 mm screw set were advanced across the joint both on the lateral and medial aspects of the joint.  AP and lateral radiographs confirmed appropriate alignment of the K-wires.  Two 5 mm partially threaded cannulated screws were inserted compressing the joint appropriately.  A fully-threaded cannulated 5 mm screw was then inserted as a position screw once adequate compression had been achieved.  AP foot, lateral foot, and Harris heel radiographs confirmed appropriate position and length of all hardware and appropriate correction of the longitudinal arch collapse. The wounds were irrigated copiously.  The dorsal wound was closed  with Vicryl, Monocryl, and nylon.  The medial wound was closed with Vicryl, Monocryl, and nylon.  Sterile dressings were applied followed by a well- padded short-leg splint.  The tourniquet was released after application of dressings at 90 minutes.  The patient was awakened from anesthesia and transported to the recovery room in stable condition.  FOLLOWUP PLAN:  The patient will be nonweightbearing on the right lower extremity.  He will follow up with me in the office in 2 weeks for suture removal and conversion to a cast.  RADIOGRAPHS:  AP lateral and Harris heel radiographs were obtained of the right foot intraoperatively.  These show interval correction of the longitudinal arch collapse and appropriate positioning of all hardware. No other acute injuries are noted.  Mechele Claude, PA-C, was present and scrubbed for the duration of the case.  His assistance was essential in positioning of the patient, prepping and draping, gaining and maintaining exposure, performing the operation, closing and dressing of the wounds, and applying the splint.     Wylene Simmer, MD   ______________________________ Wylene Simmer, MD    JH/MEDQ  D:  04/29/2016  T:  04/29/2016  Job:  [id number]

## 2016-04-29 NOTE — Brief Op Note (Signed)
04/29/2016  9:47 AM  PATIENT:  Robert [name]  70 y.o. male  PRE-OPERATIVE DIAGNOSIS:  1. RIGHT POSTERIOR TIBIAL TENDON DYSFUNCTION 2.  TIGHT RIGHT HEELCORD  POST-OPERATIVE DIAGNOSIS:   Same 3.  Torn and incompetent spring ligament  Procedure(s): 1.  RIGHT GASTROC RECESSION 2.  Right medializing calcaneal osteotomy (separate incision) 3.  Right posterior tibial tendon tenolysis (separate incision) 4.  Right talonavicular arthrodesis (separate incision)  SURGEON:  Wylene Simmer, MD  ASSISTANT:  Mechele Claude, PA-C  ANESTHESIA:   General, regional  EBL:  minimal   TOURNIQUET:   Total Tourniquet Time Documented: Thigh (Right) - 90 minutes Total: Thigh (Right) - 90 minutes  COMPLICATIONS:  None apparent  DISPOSITION:  Extubated, awake and stable to recovery.  DICTATION ID:  [id number]

## 2016-04-29 NOTE — Progress Notes (Signed)
Assisted Dr. Lauretta Grill with right, ultrasound guided, popliteal block. Side rails up, monitors on throughout procedure. See vital signs in flow sheet. Tolerated Procedure well.

## 2016-04-29 NOTE — Transfer of Care (Signed)
Immediate Anesthesia Transfer of Care Note  Patient: Robert [name]  Procedure(s) Performed: Procedure(s): RIGHT GASTROC RECESSION (Right) RIGHT FLEXOR DIGITORUM LONGUS TENDON TO NAVICULAR TRANSFER (Right) RIGHT CALCANEAL OSTEOTOMY, RIGHT MEDIAL CUNEIFORM OSTEOTOMY (Right)  Patient Location: PACU  Anesthesia Type:GA combined with regional for post-op pain  Level of Consciousness: awake and patient cooperative  Airway & Oxygen Therapy: Patient Spontanous Breathing and Patient connected to face mask oxygen  Post-op Assessment: Report given to RN and Post -op Vital signs reviewed and stable  Post vital signs: Reviewed and stable  Last Vitals:  Vitals:   04/29/16 0710 04/29/16 0711  BP: 109/64   Pulse: (!) 59 (!) 59  Resp: 12 15  Temp:      Last Pain:  Vitals:   04/29/16 0633  TempSrc: Oral      Patients Stated Pain Goal: 0 ([id number])  Complications: No apparent anesthesia complications

## 2016-04-29 NOTE — Anesthesia Procedure Notes (Signed)
Anesthesia Regional Block:  Popliteal block  Pre-Anesthetic Checklist: ,, timeout performed, Correct Patient, Correct Site, Correct Laterality, Correct Procedure, Correct Position, site marked, Risks and benefits discussed,  Surgical consent,  Pre-op evaluation,  At surgeon's request and post-op pain management  Laterality: Right  Prep: Maximum Sterile Barrier Precautions used, chloraprep       Needles:  Injection technique: Single-shot  Needle Type: Echogenic Stimulator Needle     Needle Length: 10cm 10 cm Needle Gauge: 21 G    Additional Needles:  Procedures: ultrasound guided (picture in chart) and nerve stimulator Popliteal block Narrative:  Injection made incrementally with aspirations every 5 mL.  Performed by: Personally  Anesthesiologist: Gumaro Brightbill, Stanton Kidney  Additional Notes: Patient tolerated the procedure well without complications

## 2016-04-29 NOTE — H&P (Signed)
Robert [name] is an 70 y.o. male.   Chief Complaint: right foot pain HPI: 70 y/o male with right posterior tibial tendon dysfunction and tight right heelcord.  He presents now for reconstruction of the right foot.  Past Medical History:  Diagnosis Date  . Anxiety   . Arthritis    Knee , Neck  . Colon cancer [facility])    a. s/p colon resection in 2004 and 2002  . Edema of right foot    ? muscle problem to be addressed after left knee TKA on 01/06/2015 per patient   . History of kidney stones   . Hyperlipidemia   . Hypertension   . 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 Nocturia associated with benign prostatic hypertrophy 03/21/2013  . Palpitations   . S/P cardiac catheterization 1995 and 1998   a. following abnormal stress tests. Both reportedly normal;  b. 02/2013 nl myoview.  . Sleep apnea with use of continuous positive airway pressure (CPAP)    wears CPAP nightly    Past Surgical History:  Procedure Laterality Date  . CARDIAC CATHETERIZATION  07/04/1997  . COLON RESECTION  2004   x 2 per patient   . COLON SURGERY     colon resection  . COLONOSCOPY    . ELBOW SURGERY Left 2011   INFECTED OLECRANON BURSITIS SURGERY  . INGUINAL HERNIA REPAIR Right 06/03/2015   Procedure: REPAIR RIGHT INGUINAL HERNIA;  Surgeon: Georganna Skeans, MD;  Location: [place];  Service: General;  Laterality: Right;  . INSERTION OF MESH Right 06/03/2015   Procedure: INSERTION OF MESH;  Surgeon: Georganna Skeans, MD;  Location: [place];  Service: General;  Laterality: Right;  . JOINT REPLACEMENT Left   . L4 DISCECTOMY    . RETINAL DETACHMENT SURGERY  1998  . SEPTIC JOINT Left    [place]  . TOTAL KNEE ARTHROPLASTY Left 01/06/2015   Procedure: LEFT TOTAL KNEE ARTHROPLASTY;  Surgeon: Gaynelle Arabian, MD;  Location: [facility];  Service: Orthopedics;  Laterality: Left;  . TOTAL KNEE ARTHROPLASTY Right 10/03/2015   Procedure: RIGHT TOTAL KNEE ARTHROPLASTY;  Surgeon: Gaynelle Arabian, MD;  Location: [facility];   Service: Orthopedics;  Laterality: Right;  . TRANSURETHRAL RESECTION OF PROSTATE      Family History  Problem Relation Age of Onset  . Emphysema Mother   . Heart attack Father   . Hypertension Father   . Hypertension Other   . High Cholesterol Other    Social History:  reports that he has never smoked. He has never used smokeless tobacco. He reports that he drinks about 10.2 oz of alcohol per week . He reports that he does not use drugs.  Allergies:  Allergies  Allergen Reactions  . Betadine [Povidone Iodine] Other (See Comments)    Causes stinging of skin   . Oxycodone Other (See Comments)    nightmares    Medications Prior to Admission  Medication Sig Dispense Refill  . aspirin 81 MG tablet Take 81 mg by mouth daily.    Marland Kitchen CIALIS 5 MG tablet Take 5 mg by mouth every evening.     . diltiazem (CARDIZEM CD) 180 MG 24 hr capsule Take 1 capsule (180 mg total) by mouth at bedtime. 90 capsule 2  . diphenhydrAMINE (BENYLIN) 12.5 MG/5ML syrup Take 12.5 mg by mouth at bedtime as needed for sleep.     Marland Kitchen lisinopril-hydrochlorothiazide (PRINZIDE,ZESTORETIC) 20-12.5 MG tablet TAKE 1 TABLET BY MOUTH EVERY MORNING 45 tablet 2  .  LORazepam (ATIVAN) 0.5 MG tablet Take 0.5 mg by mouth daily as needed for anxiety. When heart races    . metoprolol succinate (TOPROL-XL) 25 MG 24 hr tablet Take 1 tablet (25 mg total) by mouth 2 (two) times daily. 180 tablet 2  . Multiple Vitamin (MULTIVITAMIN WITH MINERALS) TABS tablet Take 1 tablet by mouth daily.    . NON FORMULARY CPAP MACHINE    . Omega-3 Fatty Acids (FISH OIL) 1000 MG CPDR Take by mouth.    . rosuvastatin (CRESTOR) 10 MG tablet Take 1 tablet (10 mg total) by mouth every evening. 90 tablet 3    Results for orders placed or performed during the hospital encounter of 05-13-16 (from the past 48 hour(s))  Basic metabolic panel     Status: Abnormal   Collection Time: 04/27/16 11:00 AM  Result Value Ref Range   Sodium 136 135 - 145 mmol/L    Potassium 4.3 3.5 - 5.1 mmol/L   Chloride 102 101 - 111 mmol/L   CO2 28 22 - 32 mmol/L   Glucose, Bld 89 65 - 99 mg/dL   BUN 21 (H) 6 - 20 mg/dL   Creatinine, Ser 1.04 0.61 - 1.24 mg/dL   Calcium 10.8 (H) 8.9 - 10.3 mg/dL   GFR calc non Af Amer >60 >60 mL/min   GFR calc Af Amer >60 >60 mL/min    Comment: (NOTE) The eGFR has been calculated using the CKD EPI equation. This calculation has not been validated in all clinical situations. eGFR's persistently <60 mL/min signify possible Chronic Kidney Disease.    Anion gap 6 5 - 15   No results found.  ROS  No recent f/c/n/v/wt loss  Blood pressure 109/64, pulse (!) 59, temperature 97.9 F (36.6 C), temperature source Oral, resp. rate 15, height _0  (1.753 m), weight 77.1 kg (170 lb), SpO2 99 %. Physical Exam  wn wd male in nad.  A and O x 4.  Mood and affec tnormal.  EOMI.  resp unalbored.  R foot with healthy skin.  No lymphadenopathy.  5/5 strength in PF and DF fo the ankle and toes.  Sens to LT intact dorsally and platnarly.    Assessment/Plan R posterior tibial tendon dysfunction, tight heelcord - to OR for gastroc recession, posterior tibial tendon tenolysis, FDL transfer to the navicular, calcaneal osteotomy and medial cunieform osteotomy.  The risks and benefits of the alternative treatment options have been discussed in detail.  The patient wishes to proceed with surgery and specifically understands risks of bleeding, infection, nerve damage, blood clots, need for additional surgery, amputation and death.   Wylene Simmer, MD 05/13/2016, 7:23 AM

## 2016-04-29 NOTE — Anesthesia Procedure Notes (Signed)
Procedure Name: LMA Insertion Date/Time: 04/29/2016 7:35 AM Performed by: Kirbi Farrugia D Pre-anesthesia Checklist: Patient identified, Emergency Drugs available, Suction available and Patient being monitored Patient Re-evaluated:Patient Re-evaluated prior to inductionOxygen Delivery Method: Circle system utilized Preoxygenation: Pre-oxygenation with 100% oxygen Intubation Type: IV induction Ventilation: Mask ventilation without difficulty LMA: LMA inserted LMA Size: 4.0 Number of attempts: 1 Airway Equipment and Method: Bite block Placement Confirmation: positive ETCO2 Tube secured with: Tape Dental Injury: Teeth and Oropharynx as per pre-operative assessment

## 2016-04-29 NOTE — Discharge Instructions (Addendum)
Robert Simmer, MD [place]  Please read the following information regarding your care after surgery.  Medications  You only need a prescription for the narcotic pain medicine (ex. oxycodone, Percocet, Norco).  All of the other medicines listed below are available over the counter. X acetominophen (Tylenol) 650 mg every 4-6 hours as you need for minor pain X hydrocodone as prescribed for moderate to severe pain   Narcotic pain medicine (ex. oxycodone, Percocet, Vicodin) will cause constipation.  To prevent this problem, take the following medicines while you are taking any pain medicine. X docusate sodium (Colace) 100 mg twice a day X senna (Senokot) 2 tablets twice a day   Post Anesthesia Home Care Instructions  Activity: Get plenty of rest for the remainder of the day. A responsible adult should stay with you for 24 hours following the procedure.  For the next 24 hours, DO NOT: -Drive a car -Paediatric nurse -Drink alcoholic beverages -Take any medication unless instructed by your physician -Make any legal decisions or sign important papers.  Meals: Start with liquid foods such as gelatin or soup. Progress to regular foods as tolerated. Avoid greasy, spicy, heavy foods. If nausea and/or vomiting occur, drink only clear liquids until the nausea and/or vomiting subsides. Call your physician if vomiting continues.  Special Instructions/Symptoms: Your throat may feel dry or sore from the anesthesia or the breathing tube placed in your throat during surgery. If this causes discomfort, gargle with warm salt water. The discomfort should disappear within 24 hours.  If you had a scopolamine patch placed behind your ear for the management of post- operative nausea and/or vomiting:  1. The medication in the patch is effective for 72 hours, after which it should be removed.  Wrap patch in a tissue and discard in the trash. Wash hands thoroughly with soap and water. 2. You may  remove the patch earlier than 72 hours if you experience unpleasant side effects which may include dry mouth, dizziness or visual disturbances. 3. Avoid touching the patch. Wash your hands with soap and water after contact with the patch.   Regional Anesthesia Blocks  1. Numbness or the inability to move the "blocked" extremity may last from 3-48 hours after placement. The length of time depends on the medication injected and your individual response to the medication. If the numbness is not going away after 48 hours, call your surgeon.  2. The extremity that is blocked will need to be protected until the numbness is gone and the  Strength has returned. Because you cannot feel it, you will need to take extra care to avoid injury. Because it may be weak, you may have difficulty moving it or using it. You may not know what position it is in without looking at it while the block is in effect.  3. For blocks in the legs and feet, returning to weight bearing and walking needs to be done carefully. You will need to wait until the numbness is entirely gone and the strength has returned. You should be able to move your leg and foot normally before you try and bear weight or walk. You will need someone to be with you when you first try to ensure you do not fall and possibly risk injury.  4. Bruising and tenderness at the needle site are common side effects and will resolve in a few days.  5. Persistent numbness or new problems with movement should be communicated to the surgeon or the [place] (  [id number] [place] [phone]).  X To help prevent blood clots, take a baby aspirin (81 mg) twice a day after surgery until you are allowed to bear weight on your operative extremity.  You should also get up every hour while you are awake to move around.    Weight Bearing X Do not bear any weight on the operated leg or foot.  Cast / Splint / Dressing X Keep your splint or  cast clean and dry.  Dont put anything (coat hanger, pencil, etc) down inside of it.  If it gets damp, use a hair dryer on the cool setting to dry it.  If it gets soaked, call the office to schedule an appointment for a cast change.  After your dressing, cast or splint is removed; you may shower, but do not soak or scrub the wound.  Allow the water to run over it, and then gently pat it dry.  Swelling It is normal for you to have swelling where you had surgery.  To reduce swelling and pain, keep your toes above your nose for at least 3 days after surgery.  It may be necessary to keep your foot or leg elevated for several weeks.  If it hurts, it should be elevated.  Follow Up Call my office at [phone] when you are discharged from the hospital or surgery center to schedule an appointment to be seen two weeks after surgery.  Call my office at [phone] if you develop a fever >101.5 F, nausea, vomiting, bleeding from the surgical site or severe pain.

## 2016-04-29 NOTE — Anesthesia Postprocedure Evaluation (Signed)
Anesthesia Post Note  Patient: Robert [name]  Procedure(s) Performed: Procedure(s) (LRB): RIGHT GASTROC RECESSION (Right) RIGHT FLEXOR DIGITORUM LONGUS TENDON TO NAVICULAR TRANSFER (Right) RIGHT CALCANEAL OSTEOTOMY, RIGHT MEDIAL CUNEIFORM OSTEOTOMY (Right)  Patient location during evaluation: PACU Anesthesia Type: General Level of consciousness: awake and alert Pain management: pain level controlled Vital Signs Assessment: post-procedure vital signs reviewed and stable Respiratory status: spontaneous breathing, nonlabored ventilation, respiratory function stable and patient connected to nasal cannula oxygen Cardiovascular status: blood pressure returned to baseline and stable Postop Assessment: no signs of nausea or vomiting Anesthetic complications: no    Last Vitals:  Vitals:   04/29/16 1000 04/29/16 1015  BP: 99/64 (!) 105/53  Pulse: 77   Resp: 18   Temp:      Last Pain:  Vitals:   04/29/16 1015  TempSrc:   PainSc: 1         RLE Motor Response: No movement due to regional block (04/29/16 1015)        Jostin Rue JENNETTE

## 2016-05-03 ENCOUNTER — Encounter (HOSPITAL_BASED_OUTPATIENT_CLINIC_OR_DEPARTMENT_OTHER): Payer: Self-pay | Admitting: Orthopedic Surgery

## 2016-05-28 ENCOUNTER — Encounter: Payer: Self-pay | Admitting: Cardiology

## 2016-05-28 ENCOUNTER — Other Ambulatory Visit: Payer: Self-pay

## 2016-05-28 MED ORDER — ATORVASTATIN CALCIUM 20 MG PO TABS: 20.0000 mg | ORAL_TABLET | Freq: Every day | ORAL | 6 refills | Status: DC

## 2016-06-02 ENCOUNTER — Other Ambulatory Visit: Payer: Self-pay | Admitting: Cardiology

## 2016-06-04 ENCOUNTER — Encounter (HOSPITAL_COMMUNITY): Payer: Self-pay | Admitting: Emergency Medicine

## 2016-06-04 ENCOUNTER — Emergency Department (HOSPITAL_COMMUNITY)
Admission: EM | Admit: 2016-06-04 | Discharge: 2016-06-04 | Disposition: A | Discharge status: Home / Self Care | Payer: Medicare Other | Attending: Emergency Medicine | Admitting: Emergency Medicine

## 2016-06-04 DIAGNOSIS — R600 Localized edema: Secondary | ICD-10-CM | POA: Diagnosis not present

## 2016-06-04 DIAGNOSIS — M7989 Other specified soft tissue disorders: Secondary | ICD-10-CM | POA: Diagnosis present

## 2016-06-04 DIAGNOSIS — I10 Essential (primary) hypertension: Secondary | ICD-10-CM | POA: Insufficient documentation

## 2016-06-04 DIAGNOSIS — Z79899 Other long term (current) drug therapy: Secondary | ICD-10-CM | POA: Diagnosis not present

## 2016-06-04 DIAGNOSIS — Z85038 Personal history of other malignant neoplasm of large intestine: Secondary | ICD-10-CM | POA: Insufficient documentation

## 2016-06-04 DIAGNOSIS — Z7982 Long term (current) use of aspirin: Secondary | ICD-10-CM | POA: Insufficient documentation

## 2016-06-04 NOTE — ED Triage Notes (Addendum)
Pt presents to ED with complaints of leg swelling on the right side. Pt reports having reconstructive surgery on this foot August 3rd. Noticed significant swelling over the last 48 hours. Pt went to primary physician and was referred here. Denies pain. Pt says he is a bit anxious. Pt denies numbness in the affected extremity, good pulses present.

## 2016-06-04 NOTE — Discharge Instructions (Signed)
Please see your orthopedist at the appointment on Monday morning, if you have any concerns don't hesitate to return to the emergency department, keep the leg elevated above the level of the heart is much as possible.

## 2016-06-04 NOTE — ED Provider Notes (Signed)
[place] DEPT Provider Note   CSN: [id number] Arrival date & time: 06/04/16  1459     History   Chief Complaint Chief Complaint  Patient presents with  . Leg Swelling    HPI  Blood pressure 116/64, pulse 61, temperature 98 F (36.7 C), temperature source Oral, resp. rate 20, height 5\' 10"  (1.778 m), weight 74.8 kg, SpO2 99 %.  Robert [name] is a 70 y.o. male complaining of swelling to right lower extremity onset today. Patient had the "all marrow can" procedure done on his right foot by Dr. Doran Durand on August 3. He had been in his normal state of health, he is nonweightbearing. He was more active than normal today and did not elevate the foot is much as he normally does. The swelling was significant, it was very painful. He tried to get an point with his orthopedist but couldn't get in touch with the meantime to see them today. He has been taking a low-dose aspirin in the a.m. and p.m. He states that while he was waiting to be evaluated in the emergency department the swelling has completely resolved. He denies fever, chills, chest pain, palpitations, shortness of breath, cough, hemoptysis.  HPI  Past Medical History:  Diagnosis Date  . Anxiety   . Arthritis    Knee , Neck  . Colon cancer [facility])    a. s/p colon resection in 2004 and 2002  . Edema of right foot    ? muscle problem to be addressed after left knee TKA on 01/06/2015 per patient   . History of kidney stones   . Hyperlipidemia   . Hypertension   . 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 Nocturia associated with benign prostatic hypertrophy 03/21/2013  . Palpitations   . S/P cardiac catheterization 1995 and 1998   a. following abnormal stress tests. Both reportedly normal;  b. 02/2013 nl myoview.  . Sleep apnea with use of continuous positive airway pressure (CPAP)    wears CPAP nightly    Patient Active Problem List   Diagnosis Date Noted  . Nocturia more than twice per night  04/24/2015  . OA (osteoarthritis) of knee 01/06/2015  . OSA on CPAP 03/20/2014  . Palpitations 10/23/2013  . Nocturia associated with benign prostatic hypertrophy 03/21/2013  . Sleep apnea with use of continuous positive airway pressure (CPAP)   . Coronary artery calcification seen on CAT scan 05/25/2012  . Tachycardia 12/08/2011  . Hypertension   . Hyperlipidemia     Past Surgical History:  Procedure Laterality Date  . CALCANEAL OSTEOTOMY Right 04/29/2016   Procedure: RIGHT CALCANEAL OSTEOTOMY, RIGHT MEDIAL CUNEIFORM OSTEOTOMY;  Surgeon: Wylene Simmer, MD;  Location: [place];  Service: Orthopedics;  Laterality: Right;  . CARDIAC CATHETERIZATION  07/04/1997  . COLON RESECTION  2004   x 2 per patient   . COLON SURGERY     colon resection  . COLONOSCOPY    . ELBOW SURGERY Left 2011   INFECTED OLECRANON BURSITIS SURGERY  . GASTROCNEMIUS RECESSION Right 04/29/2016   Procedure: RIGHT GASTROC RECESSION;  Surgeon: Wylene Simmer, MD;  Location: [place];  Service: Orthopedics;  Laterality: Right;  . INGUINAL HERNIA REPAIR Right 06/03/2015   Procedure: REPAIR RIGHT INGUINAL HERNIA;  Surgeon: Georganna Skeans, MD;  Location: [place];  Service: General;  Laterality: Right;  . INSERTION OF MESH Right 06/03/2015   Procedure: INSERTION OF MESH;  Surgeon: Georganna Skeans, MD;  Location: [place];  Service: General;  Laterality: Right;  . JOINT REPLACEMENT Left   . L4 DISCECTOMY    . RETINAL DETACHMENT SURGERY  1998  . SEPTIC JOINT Left    [place]  . TOTAL KNEE ARTHROPLASTY Left 01/06/2015   Procedure: LEFT TOTAL KNEE ARTHROPLASTY;  Surgeon: Gaynelle Arabian, MD;  [facility];  Service: Orthopedics;  Laterality: Left;  . TOTAL KNEE ARTHROPLASTY Right 10/03/2015   Procedure: RIGHT TOTAL KNEE ARTHROPLASTY;  Surgeon: Gaynelle Arabian, MD;  Location: [facility];  Service: Orthopedics;  Laterality: Right;  . TRANSURETHRAL RESECTION OF PROSTATE         Home Medications    Prior to  Admission medications   Medication Sig Start Date End Date Taking? Authorizing Provider  aspirin EC 81 MG tablet Take 1 tablet (81 mg total) by mouth 2 (two) times daily. 04/29/16   Corky Sing, PA-C  atorvastatin (LIPITOR) 20 MG tablet Take 1 tablet (20 mg total) by mouth daily. 05/28/16 08/26/16  Peter M Martinique, MD  CIALIS 5 MG tablet Take 5 mg by mouth every evening.  03/14/14   Historical Provider, MD  diltiazem (CARDIZEM CD) 180 MG 24 hr capsule Take 1 capsule (180 mg total) by mouth at bedtime. 10/04/15   Gaynelle Arabian, MD  diphenhydrAMINE (BENYLIN) 12.5 MG/5ML syrup Take 12.5 mg by mouth at bedtime as needed for sleep.     Historical Provider, MD  docusate sodium (COLACE) 100 MG capsule Take 1 capsule (100 mg total) by mouth 2 (two) times daily. While taking narcotic pain medicine. 04/29/16   Corky Sing, PA-C  HYDROcodone-acetaminophen (NORCO/VICODIN) 5-325 MG tablet Take 1-2 tablets by mouth every 4 (four) hours as needed for moderate pain. 04/29/16   Corky Sing, PA-C  lisinopril-hydrochlorothiazide (PRINZIDE,ZESTORETIC) 20-12.5 MG tablet TAKE 1 TABLET BY MOUTH EVERY MORNING 04/15/16   Peter M Martinique, MD  LORazepam (ATIVAN) 0.5 MG tablet Take 0.5 mg by mouth daily as needed for anxiety. When heart races    Historical Provider, MD  metoprolol succinate (TOPROL-XL) 25 MG 24 hr tablet TAKE 1 TABLET (25 MG TOTAL) BY MOUTH 2 (TWO) TIMES DAILY. 06/02/16   Peter M Martinique, MD  Multiple Vitamin (MULTIVITAMIN WITH MINERALS) TABS tablet Take 1 tablet by mouth daily.    Historical Provider, MD  NON FORMULARY CPAP MACHINE    Historical Provider, MD  Omega-3 Fatty Acids (FISH OIL) 1000 MG CPDR Take by mouth.    Historical Provider, MD  senna (SENOKOT) 8.6 MG TABS tablet Take 2 tablets (17.2 mg total) by mouth 2 (two) times daily. 04/29/16   Corky Sing, PA-C    Family History Family History  Problem Relation Age of Onset  . Emphysema Mother   . Heart attack Father   . Hypertension Father     . Hypertension Other   . High Cholesterol Other     Social History Social History  Substance Use Topics  . Smoking status: Never Smoker  . Smokeless tobacco: Never Used  . Alcohol use 10.2 oz/week    8 Glasses of wine, 8 Shots of liquor, 1 Standard drinks or equivalent per week     Comment: each night 1 glass     Allergies   Betadine [povidone iodine] and Oxycodone   Review of Systems Review of Systems  10 systems reviewed and found to be negative, except as noted in the HPI.   Physical Exam Updated Vital Signs BP 113/72 (BP Location: Right Arm)   Pulse 64   Temp  98.8 F (37.1 C) (Oral)   Resp 15   Ht 5\' 10"  (1.778 m)   Wt 74.8 kg   SpO2 98%   BMI 23.68 kg/m   Physical Exam  Constitutional: He is oriented to person, place, and time. He appears well-developed and well-nourished. No distress.  HENT:  Head: Normocephalic and atraumatic.  Mouth/Throat: Oropharynx is clear and moist.  Eyes: Conjunctivae and EOM are normal. Pupils are equal, round, and reactive to light.  Neck: Normal range of motion.  Cardiovascular: Normal rate, regular rhythm and intact distal pulses.   Pulmonary/Chest: Effort normal and breath sounds normal.  Abdominal: Soft. There is no tenderness.  Musculoskeletal: Normal range of motion. He exhibits no edema or deformity.  Short leg cast to right lower extremity, and no significant swelling in the calf, no tenderness to palpation along the calf. Mild edema in the toes, distally neurovascular intact.  Neurological: He is alert and oriented to person, place, and time.  Skin: He is not diaphoretic.  Psychiatric: He has a normal mood and affect.  Nursing note and vitals reviewed.    ED Treatments / Results  Labs (all labs ordered are listed, but only abnormal results are displayed) Labs Reviewed - No data to display  EKG  EKG Interpretation None       Radiology No results found.  Procedures Procedures (including critical care  time)  Medications Ordered in ED Medications - No data to display   Initial Impression / Assessment and Plan / ED Course  I have reviewed the triage vital signs and the nursing notes.  Pertinent labs & imaging results that were available during my care of the patient were reviewed by me and considered in my medical decision making (see chart for details).  Clinical Course    Vitals:   06/04/16 1513 06/04/16 1515 06/04/16 1922 06/04/16 2033  BP: 112/60  116/64 113/72  Pulse: 91  61 64  Resp:   20 15  Temp: 98.7 F (37.1 C)  98 F (36.7 C) 98.8 F (37.1 C)  TempSrc: Oral  Oral Oral  SpO2: 95%  99% 98%  Weight:  74.8 kg    Height:  5\' 10"  (1.778 m)      Medications - No data to display  Robert [name] is 70 y.o. male presenting with Resolved lower extremity edema. Patient was more active than normal today, he was elevating the foot as much as he normally does. He is status post extensive reconstruction surgery over one month ago. He has not had an issue with swelling up until this point. Physical exam is reassuring, Refill is brisk. Edema has essentially resolved after he elevated the foot. Discussed case with Dr. supple who recommends reassurance versus bivalved. I discussed this with the patient and he feels comfortable going home, but invited him to return to the emergency department at any time. He will follow with Dr. Doran Durand first thing on Monday morning.  Evaluation does not show pathology that would require ongoing emergent intervention or inpatient treatment. Pt is hemodynamically stable and mentating appropriately. Discussed findings and plan with patient/guardian, who agrees with care plan. All questions answered. Return precautions discussed and outpatient follow up given.    Final Clinical Impressions(s) / ED Diagnoses   Final diagnoses:  Edema of right lower extremity      Monico Blitz, PA-C 06/04/16 2040    Forde Dandy, MD 06/05/16 [phone]

## 2016-06-25 ENCOUNTER — Other Ambulatory Visit: Payer: Self-pay | Admitting: *Deleted

## 2016-06-25 MED ORDER — LISINOPRIL-HYDROCHLOROTHIAZIDE 20-12.5 MG PO TABS: 1.0000 | ORAL_TABLET | Freq: Every morning | ORAL | 2 refills | Status: DC

## 2016-07-06 ENCOUNTER — Telehealth: Payer: Self-pay

## 2016-07-06 MED ORDER — LISINOPRIL-HYDROCHLOROTHIAZIDE 20-12.5 MG PO TABS: 1.0000 | ORAL_TABLET | Freq: Every morning | ORAL | 3 refills | Status: DC

## 2016-07-06 NOTE — Telephone Encounter (Signed)
Lisinopril/HCTZ 90 day refill sent to pharmacy.

## 2016-07-08 ENCOUNTER — Other Ambulatory Visit: Payer: Self-pay

## 2016-07-08 ENCOUNTER — Other Ambulatory Visit: Payer: Self-pay | Admitting: Cardiology

## 2016-07-08 MED ORDER — DILTIAZEM HCL ER COATED BEADS 180 MG PO CP24: 180.0000 mg | ORAL_CAPSULE | Freq: Every day | ORAL | 2 refills | Status: DC

## 2016-07-09 ENCOUNTER — Other Ambulatory Visit: Payer: Self-pay | Admitting: Cardiology

## 2016-07-09 NOTE — Telephone Encounter (Signed)
diltiazem (CARDIZEM CD) 180 MG 24 hr capsule  Medication  Date: 07/08/2016 Department: [place] Ordering/Authorizing: Peter M Martinique, MD  Order Providers   Prescribing Provider Encounter Provider  Peter M Martinique, MD Stephannie Peters, CMA  Medication Detail    Disp Refills Start End   diltiazem (CARDIZEM CD) 180 MG 24 hr capsule 90 capsule 2 07/08/2016    Sig - Route: Take 1 capsule (180 mg total) by mouth at bedtime. - Oral   E-Prescribing Status: Receipt confirmed by pharmacy (07/08/2016 3:41 PM EDT)   Pharmacy   [facility] #[id number] - [place], [place] - [address]

## 2016-08-15 ENCOUNTER — Encounter: Payer: Self-pay | Admitting: Cardiology

## 2016-08-16 ENCOUNTER — Telehealth: Payer: Self-pay

## 2016-08-16 ENCOUNTER — Other Ambulatory Visit: Payer: Self-pay

## 2016-08-16 DIAGNOSIS — E785 Hyperlipidemia, unspecified: Secondary | ICD-10-CM

## 2016-08-16 DIAGNOSIS — R000 Tachycardia, unspecified: Secondary | ICD-10-CM

## 2016-08-16 DIAGNOSIS — D649 Anemia, unspecified: Secondary | ICD-10-CM

## 2016-08-16 DIAGNOSIS — R002 Palpitations: Secondary | ICD-10-CM

## 2016-08-16 DIAGNOSIS — I10 Essential (primary) hypertension: Secondary | ICD-10-CM

## 2016-08-16 NOTE — Telephone Encounter (Signed)
Spoke to patient.He stated he was checking to see if he needed fasting lab before appointment with Dr.Jordan 08/31/16.Advised he needs lipid,heaptic,bmet,cbc.Lab orders mailed to patient.He will have done a few days before 08/31/16.

## 2016-08-24 ENCOUNTER — Encounter: Payer: Self-pay | Admitting: Cardiology

## 2016-08-26 LAB — HEPATIC FUNCTION PANEL
ALBUMIN: 4.4 g/dL (ref 3.6–5.1)
ALT: 27 U/L (ref 9–46)
AST: 21 U/L (ref 10–35)
Alkaline Phosphatase: 95 U/L (ref 40–115)
BILIRUBIN DIRECT: 0.1 mg/dL (ref <=0.2)
BILIRUBIN TOTAL: 0.6 mg/dL (ref 0.2–1.2)
Indirect Bilirubin: 0.5 mg/dL (ref 0.2–1.2)
Total Protein: 6.7 g/dL (ref 6.1–8.1)

## 2016-08-26 LAB — CBC WITH DIFFERENTIAL/PLATELET
BASOS ABS: 48 {cells}/uL (ref 0–200)
BASOS PCT: 1 %
EOS ABS: 96 {cells}/uL (ref 15–500)
Eosinophils Relative: 2 %
HCT: 44.8 % (ref 38.5–50.0)
HEMOGLOBIN: 14.8 g/dL (ref 13.2–17.1)
LYMPHS ABS: 1296 {cells}/uL (ref 850–3900)
Lymphocytes Relative: 27 %
MCH: 29.2 pg (ref 27.0–33.0)
MCHC: 33 g/dL (ref 32.0–36.0)
MCV: 88.4 fL (ref 80.0–100.0)
MONO ABS: 528 {cells}/uL (ref 200–950)
MPV: 10 fL (ref 7.5–12.5)
Monocytes Relative: 11 %
NEUTROS ABS: 2832 {cells}/uL (ref 1500–7800)
Neutrophils Relative %: 59 %
Platelets: 171 10*3/uL (ref 140–400)
RBC: 5.07 MIL/uL (ref 4.20–5.80)
RDW: 13.6 % (ref 11.0–15.0)
WBC: 4.8 10*3/uL (ref 3.8–10.8)

## 2016-08-26 LAB — BASIC METABOLIC PANEL
BUN: 23 mg/dL (ref 7–25)
CHLORIDE: 100 mmol/L (ref 98–110)
CO2: 27 mmol/L (ref 20–31)
Calcium: 10.5 mg/dL — ABNORMAL HIGH (ref 8.6–10.3)
Creat: 1.04 mg/dL (ref 0.70–1.25)
Glucose, Bld: 89 mg/dL (ref 65–99)
POTASSIUM: 4.5 mmol/L (ref 3.5–5.3)
SODIUM: 136 mmol/L (ref 135–146)

## 2016-08-26 LAB — LIPID PANEL
CHOL/HDL RATIO: 3.9 ratio (ref <5.0)
Cholesterol: 186 mg/dL (ref <200)
HDL: 48 mg/dL (ref >40)
LDL Cholesterol: 106 mg/dL — ABNORMAL HIGH (ref <100)
Triglycerides: 161 mg/dL — ABNORMAL HIGH (ref <150)
VLDL: 32 mg/dL — AB (ref <30)

## 2016-08-29 NOTE — Progress Notes (Signed)
[name] Date of Birth: Mar 10, 1946   History of Present Illness: Eduard Clos is seen today for followup HTN and HL. He is s/p right TKR in January. In August he had right foot tendon repair. He is now retired. He does note occasional flushing without other symptoms. Concerned that his BP goes up sometimes- typically in the mid 130s. No chest pain or SOB. No cough. Notes that Crestor will be on his formulary this year.  Current Outpatient Prescriptions on File Prior to Visit  Medication Sig Dispense Refill  . aspirin EC 81 MG tablet Take 1 tablet (81 mg total) by mouth 2 (two) times daily. 84 tablet 0  . CIALIS 5 MG tablet Take 5 mg by mouth every evening.     . diltiazem (CARDIZEM CD) 180 MG 24 hr capsule Take 1 capsule (180 mg total) by mouth at bedtime. 90 capsule 2  . diphenhydrAMINE (BENYLIN) 12.5 MG/5ML syrup Take 12.5 mg by mouth at bedtime as needed for sleep.     Marland Kitchen lisinopril-hydrochlorothiazide (PRINZIDE,ZESTORETIC) 20-12.5 MG tablet Take 1 tablet by mouth every morning. 90 tablet 3  . LORazepam (ATIVAN) 0.5 MG tablet Take 0.5 mg by mouth daily as needed for anxiety. When heart races    . metoprolol succinate (TOPROL-XL) 25 MG 24 hr tablet TAKE 1 TABLET (25 MG TOTAL) BY MOUTH 2 (TWO) TIMES DAILY. 180 tablet 2  . Multiple Vitamin (MULTIVITAMIN WITH MINERALS) TABS tablet Take 1 tablet by mouth daily.    . NON FORMULARY CPAP MACHINE    . Omega-3 Fatty Acids (FISH OIL) 1000 MG CPDR Take 1,400 mg by mouth.     Marland Kitchen atorvastatin (LIPITOR) 20 MG tablet Take 1 tablet (20 mg total) by mouth daily. 30 tablet 6  . [DISCONTINUED] hyoscyamine (LEVSIN SL) 0.125 MG SL tablet      No current facility-administered medications on file prior to visit.     Allergies  Allergen Reactions  . Betadine [Povidone Iodine] Other (See Comments)    Causes stinging of skin   . Oxycodone Other (See Comments)    nightmares    Past Medical History:  Diagnosis Date  . Anxiety   . Arthritis    Knee ,  Neck  . Colon cancer [facility])    a. s/p colon resection in 2004 and 2002  . Edema of right foot    ? muscle problem to be addressed after left knee TKA on 01/06/2015 per patient   . History of kidney stones   . Hyperlipidemia   . Hypertension   . 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 Nocturia associated with benign prostatic hypertrophy 03/21/2013  . Palpitations   . S/P cardiac catheterization 1995 and 1998   a. following abnormal stress tests. Both reportedly normal;  b. 02/2013 nl myoview.  . Sleep apnea with use of continuous positive airway pressure (CPAP)    wears CPAP nightly    Past Surgical History:  Procedure Laterality Date  . CALCANEAL OSTEOTOMY Right 04/29/2016   Procedure: RIGHT CALCANEAL OSTEOTOMY, RIGHT MEDIAL CUNEIFORM OSTEOTOMY;  Surgeon: Wylene Simmer, MD;  Location: [place];  Service: Orthopedics;  Laterality: Right;  . CARDIAC CATHETERIZATION  07/04/1997  . COLON RESECTION  2004   x 2 per patient   . COLON SURGERY     colon resection  . COLONOSCOPY    . ELBOW SURGERY Left 2011   INFECTED OLECRANON BURSITIS SURGERY  . GASTROCNEMIUS RECESSION Right 04/29/2016   Procedure:  RIGHT GASTROC RECESSION;  Surgeon: Wylene Simmer, MD;  Location: [place];  Service: Orthopedics;  Laterality: Right;  . INGUINAL HERNIA REPAIR Right 06/03/2015   Procedure: REPAIR RIGHT INGUINAL HERNIA;  Surgeon: Georganna Skeans, MD;  Location: [place];  Service: General;  Laterality: Right;  . INSERTION OF MESH Right 06/03/2015   Procedure: INSERTION OF MESH;  Surgeon: Georganna Skeans, MD;  Location: [place];  Service: General;  Laterality: Right;  . JOINT REPLACEMENT Left   . L4 DISCECTOMY    . RETINAL DETACHMENT SURGERY  1998  . SEPTIC JOINT Left    [place]  . TOTAL KNEE ARTHROPLASTY Left 01/06/2015   Procedure: LEFT TOTAL KNEE ARTHROPLASTY;  Surgeon: Gaynelle Arabian, MD;  [facility];  Service: Orthopedics;  Laterality: Left;  . TOTAL KNEE  ARTHROPLASTY Right 10/03/2015   Procedure: RIGHT TOTAL KNEE ARTHROPLASTY;  Surgeon: Gaynelle Arabian, MD;  [facility];  Service: Orthopedics;  Laterality: Right;  . TRANSURETHRAL RESECTION OF PROSTATE      History  Smoking Status  . Never Smoker  Smokeless Tobacco  . Never Used    History  Alcohol Use  . 10.2 oz/week  . 8 Glasses of wine, 8 Shots of liquor, 1 Standard drinks or equivalent per week    Comment: each night 1 glass    Family History  Problem Relation Age of Onset  . Emphysema Mother   . Heart attack Father   . Hypertension Father   . Hypertension Other   . High Cholesterol Other     Review of Systems: As noted in HPI. All other systems are reviewed and are negative.  Physical Exam: BP 116/62   Pulse (!) 57   Ht 5\' 10"  (1.778 m)   Wt 173 lb (78.5 kg)   SpO2 97%   BMI 24.82 kg/m Repeat BP is 122/64. His BP cuff is reading a little high. He is a pleasant white male in no acute distress. His HEENT exam is unremarkable. He has no JVD or bruits. Lungs are clear. Cardiac exam reveals a regular rate and rhythm without gallop, murmur, or click. Abdomen is soft and nontender. He has no edema. Pedal pulses are good.  LABORATORY DATA: Lab Results  Component Value Date   WBC 4.8 08/26/2016   HGB 14.8 08/26/2016   HCT 44.8 08/26/2016   PLT 171 08/26/2016   GLUCOSE 89 08/26/2016   CHOL 186 08/26/2016   TRIG 161 (H) 08/26/2016   HDL 48 08/26/2016   LDLCALC 106 (H) 08/26/2016   ALT 27 08/26/2016   AST 21 08/26/2016   NA 136 08/26/2016   K 4.5 08/26/2016   CL 100 08/26/2016   CREATININE 1.04 08/26/2016   BUN 23 08/26/2016   CO2 27 08/26/2016   TSH 1.13 10/23/2013   PSA 1.00 03/09/2007   INR 0.99 09/25/2015   Ecg today shows NSR with normal Ecg. I have personally reviewed and interpreted this study.  Assessment / Plan: 1. Hypertension. Blood pressure is under excellent control currently. Continue current therapy.  2. Hypercholesterolemia. LDL increased  to 106 on lipitor. Much better previously on Crestor. Will switch back to Crestor 10 mg daily. Repeat fasting lab in March.   3. Coronary calcification noted incidentally on a CT scan. This was in the mid RCA distribution. Stress Myoview study 03/23/13 was negative for ischemia. Ejection fraction was normal. He is asymptomatic. Continue risk factor modification.  4. Obstructive sleep apnea now on CPAP.  Follow up in 6 months.

## 2016-08-31 ENCOUNTER — Ambulatory Visit (INDEPENDENT_AMBULATORY_CARE_PROVIDER_SITE_OTHER): Payer: Medicare Other | Admitting: Cardiology

## 2016-08-31 ENCOUNTER — Encounter: Payer: Self-pay | Admitting: Cardiology

## 2016-08-31 VITALS — BP 116/62 | HR 57 | Ht 70.0 in | Wt 173.0 lb

## 2016-08-31 DIAGNOSIS — E785 Hyperlipidemia, unspecified: Secondary | ICD-10-CM

## 2016-08-31 DIAGNOSIS — I2510 Atherosclerotic heart disease of native coronary artery without angina pectoris: Secondary | ICD-10-CM | POA: Diagnosis not present

## 2016-08-31 DIAGNOSIS — I10 Essential (primary) hypertension: Secondary | ICD-10-CM

## 2016-08-31 MED ORDER — ROSUVASTATIN CALCIUM 10 MG PO TABS: 10.0000 mg | ORAL_TABLET | Freq: Every day | ORAL | 3 refills | Status: DC

## 2016-08-31 NOTE — Patient Instructions (Addendum)
Switch lipitor to Crestor 10 mg daily  We will repeat fasting labs in March  Continue your other therapy  I will see you in 6 months.

## 2016-10-26 ENCOUNTER — Telehealth: Payer: Self-pay | Admitting: Cardiology

## 2016-10-26 NOTE — Telephone Encounter (Signed)
Returning your call. °

## 2016-10-26 NOTE — Telephone Encounter (Signed)
[id number]  

## 2016-10-26 NOTE — Telephone Encounter (Signed)
New message   Patient c/o Palpitations:  High priority if patient c/o lightheadedness and shortness of breath.  1. How long have you been having palpitations? 10/26/16    2. Are you currently experiencing lightheadedness and shortness of breath? No, Cant describe his feeling  3. Have you checked your BP and heart rate? (document readings) HR  58   59   43    45    BP 113/67   135/84     4. Are you experiencing any other symptoms? No energy

## 2016-10-26 NOTE — Telephone Encounter (Signed)
Spoke with pt states that he would like appt to discuss BP and Hr, appt scheduled with [place], [place]

## 2016-10-27 ENCOUNTER — Encounter: Payer: Self-pay | Admitting: Cardiology

## 2016-10-27 ENCOUNTER — Ambulatory Visit (INDEPENDENT_AMBULATORY_CARE_PROVIDER_SITE_OTHER): Payer: Medicare Other | Admitting: Cardiology

## 2016-10-27 VITALS — BP 98/58 | HR 59 | Ht 69.0 in | Wt 178.0 lb

## 2016-10-27 DIAGNOSIS — R001 Bradycardia, unspecified: Secondary | ICD-10-CM | POA: Diagnosis not present

## 2016-10-27 DIAGNOSIS — R5383 Other fatigue: Secondary | ICD-10-CM

## 2016-10-27 MED ORDER — METOPROLOL SUCCINATE ER 25 MG PO TB24: 25.0000 mg | ORAL_TABLET | Freq: Every day | ORAL | 2 refills | Status: DC

## 2016-10-27 NOTE — Patient Instructions (Addendum)
Medication Instructions:  DECREASE Metoprolol 25mg  to Once a day  Labwork: None   Testing/Procedures: Your physician has recommended that you wear an 14 day event monitor. Event monitors are medical devices that record the heart's electrical activity. Doctors most often [place] these monitors to diagnose arrhythmias. Arrhythmias are problems with the speed or rhythm of the heartbeat. The monitor is a small, portable device. You can wear one while you do your normal daily activities. This is usually used to diagnose what is causing palpitations/syncope (passing out).  Schedule at [facility]  Follow-Up: Your physician recommends that you schedule a follow-up appointment in: 3 weeks with Dr Martinique OR Ignacia Bayley, Almyra Deforest or Bernerd Pho.  Any Other Special Instructions Will Be Listed Below (If Applicable).  If you need a refill on your cardiac medications before your next appointment, please call your pharmacy.

## 2016-10-27 NOTE — Progress Notes (Addendum)
10/27/2016 Robert [name]   Aug 29, 1946  [id number]  Primary Physician [name], MD Primary Cardiologist: Dr. Martinique    Reason for Visit/CC: Low Pulse Rates   HPI:  The patient is a 71 y/o male, followed by Dr. Martinique for HTN and HLD who presents to clinic today for f/u. There is no documented h/o CAD. He had a cardiac cath in 1998 that was reportedly normal and a stress test in 2014 that was also normal with normal LVEF. He has been on Crestor for HLD and metoprolol, Cardizem and lisinopril/HCTZ for HTN. He also takes Cialis for BPH. He also has a h/o colon CA in 2004, treated with partial colon resection.  Of note, he has also had issues with severe facial flushing. His PCP, Dr. [name], thinks this may be related to Crestor and Cialis. He was told to hold both and has seen significant improvement. The patient plans to restart Crestor later this week however.   He presents to clinic today with complaints of low pulse rates and fatigue over the last several days. He has monitored his pulse with his blood pressure monitor and has also performed manual pulse checks. He has had rates in the 40s- low 60s. He has felt tired/ sluggish but denies syncope/ near syncope and dizziness. No CP or dyspnea. He notes his mother required a PPM. His initial  pulse rate in clinic today was 59 bpm. EKG shows sinus bradycardia with HR of 49 bpm. He tells me that he has been taking metoprolol XL, 25 mg BID.     Current Meds  Medication Sig  . aspirin EC 81 MG tablet Take 1 tablet (81 mg total) by mouth 2 (two) times daily.  Marland Kitchen diltiazem (CARDIZEM CD) 180 MG 24 hr capsule Take 1 capsule (180 mg total) by mouth at bedtime.  . diphenhydrAMINE (BENYLIN) 12.5 MG/5ML syrup Take 12.5 mg by mouth at bedtime as needed for sleep.   . Folic Acid-Vit Q000111Q 123456 (FOLBEE) 2.5-25-1 MG TABS tablet Take 1 tablet by mouth daily.  Marland Kitchen lisinopril-hydrochlorothiazide (PRINZIDE,ZESTORETIC) 20-12.5 MG tablet Take 1 tablet by mouth  every morning.  Marland Kitchen LORazepam (ATIVAN) 0.5 MG tablet Take 0.5 mg by mouth daily as needed for anxiety. When heart races  . metoprolol succinate (TOPROL-XL) 25 MG 24 hr tablet Take 1 tablet (25 mg total) by mouth daily.  . Multiple Vitamin (MULTIVITAMIN WITH MINERALS) TABS tablet Take 1 tablet by mouth daily.  . NON FORMULARY CPAP MACHINE  . Omega-3 Fatty Acids (FISH OIL) 1000 MG CPDR Take 1,400 mg by mouth.   . [DISCONTINUED] metoprolol succinate (TOPROL-XL) 25 MG 24 hr tablet TAKE 1 TABLET (25 MG TOTAL) BY MOUTH 2 (TWO) TIMES DAILY.   Allergies  Allergen Reactions  . Betadine [Povidone Iodine] Other (See Comments)    Causes stinging of skin   . Oxycodone Other (See Comments)    nightmares   Past Medical History:  Diagnosis Date  . Anxiety   . Arthritis    Knee , Neck  . Colon cancer [facility])    a. s/p colon resection in 2004 and 2002  . Edema of right foot    ? muscle problem to be addressed after left knee TKA on 01/06/2015 per patient   . History of kidney stones   . Hyperlipidemia   . Hypertension   . 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 Nocturia associated with benign prostatic hypertrophy 03/21/2013  . Palpitations   . S/P  cardiac catheterization 1995 and 1998   a. following abnormal stress tests. Both reportedly normal;  b. 02/2013 nl myoview.  . Sleep apnea with use of continuous positive airway pressure (CPAP)    wears CPAP nightly   Family History  Problem Relation Age of Onset  . Emphysema Mother   . Heart attack Father   . Hypertension Father   . Hypertension Other   . High Cholesterol Other    Past Surgical History:  Procedure Laterality Date  . CALCANEAL OSTEOTOMY Right 04/29/2016   Procedure: RIGHT CALCANEAL OSTEOTOMY, RIGHT MEDIAL CUNEIFORM OSTEOTOMY;  Surgeon: Wylene Simmer, MD;  Location: [place];  Service: Orthopedics;  Laterality: Right;  . CARDIAC CATHETERIZATION  07/04/1997  . COLON RESECTION  2004   x 2 per patient     . COLON SURGERY     colon resection  . COLONOSCOPY    . ELBOW SURGERY Left 2011   INFECTED OLECRANON BURSITIS SURGERY  . GASTROCNEMIUS RECESSION Right 04/29/2016   Procedure: RIGHT GASTROC RECESSION;  Surgeon: Wylene Simmer, MD;  Location: [place];  Service: Orthopedics;  Laterality: Right;  . INGUINAL HERNIA REPAIR Right 06/03/2015   Procedure: REPAIR RIGHT INGUINAL HERNIA;  Surgeon: Georganna Skeans, MD;  Location: [place];  Service: General;  Laterality: Right;  . INSERTION OF MESH Right 06/03/2015   Procedure: INSERTION OF MESH;  Surgeon: Georganna Skeans, MD;  Location: [place];  Service: General;  Laterality: Right;  . JOINT REPLACEMENT Left   . L4 DISCECTOMY    . RETINAL DETACHMENT SURGERY  1998  . SEPTIC JOINT Left    [place]  . TOTAL KNEE ARTHROPLASTY Left 01/06/2015   Procedure: LEFT TOTAL KNEE ARTHROPLASTY;  Surgeon: Gaynelle Arabian, MD;  [facility];  Service: Orthopedics;  Laterality: Left;  . TOTAL KNEE ARTHROPLASTY Right 10/03/2015   Procedure: RIGHT TOTAL KNEE ARTHROPLASTY;  Surgeon: Gaynelle Arabian, MD;  Location[facility];  Service: Orthopedics;  Laterality: Right;  . TRANSURETHRAL RESECTION OF PROSTATE     Social History   Social History  . Marital status: Married    Spouse name: [name]  . Number of children: 3  . Years of education: Ph.D   Occupational History  . ADMIN Designer, multimedia   Social History Main Topics  . Smoking status: Never Smoker  . Smokeless tobacco: Never Used  . Alcohol use 10.2 oz/week    8 Glasses of wine, 8 Shots of liquor, 1 Standard drinks or equivalent per week     Comment: each night 1 glass  . Drug use: No  . Sexual activity: Yes   Other Topics Concern  . Not on file   Social History Narrative   Sleep apnea 28 AHI was reduced to 3.4- set to  7 cm water pressure on nasal mask eson or wisp.  He sleep  nonsupine, less nocturia.  Tennis ball method explained.  Patient does not want machine set to a certain  pressure.     Downloaded CMS compliance s etsablsihed .  Insomnia not improved further - will try seroquel as  medication approach and discussed behaviour therapy approach .   send for n beahiour therapy and started trial of SEROQUEL>  25 minute visit including CMS compliance and downloading.    Patient is married [name]) and lives at home with his wife.   Patient has three children and his wife has three children.   Patient is working full-time.   Patient has a  Ph.D.   Patient is right-handed.   Patient drinks about 21 oz of coffee daily.           Review of Systems: General: negative for chills, fever, night sweats or weight changes.  Cardiovascular: negative for chest pain, dyspnea on exertion, edema, orthopnea, palpitations, paroxysmal nocturnal dyspnea or shortness of breath Dermatological: negative for rash Respiratory: negative for cough or wheezing Urologic: negative for hematuria Abdominal: negative for nausea, vomiting, diarrhea, bright red blood per rectum, melena, or hematemesis Neurologic: negative for visual changes, syncope, or dizziness All other systems reviewed and are otherwise negative except as noted above.   Physical Exam:  Blood pressure (!) 98/58, pulse (!) 59, height 5\' 9"  (1.753 m), weight 178 lb (80.7 kg), SpO2 97 %.  General appearance: alert, cooperative and no distress Neck: no carotid bruit and no JVD Lungs: clear to auscultation bilaterally Heart: regular rate and rhythm, S1, S2 normal, no murmur, click, rub or gallop Extremities: extremities normal, atraumatic, no cyanosis or edema Pulses: 2+ and symmetric Skin: Skin color, texture, turgor normal. No rashes or lesions Neurologic: Grossly normal  EKG sinus bradycardia. HR 49 bpm   ASSESSMENT AND PLAN:   1. Sinus Bradycardia: EKG today shows sinus bradycardia with HR of 49 bpm. He has also had recorded pulse readings in the 40s-60 at home. Other than fatigue, he denies any symptoms of syncope/  near syncope, CP and dyspnea. BP is stable. He is currently asymptomatic in clinic today. He is on 2 AV nodal blocking agents, Cardizem 120 and metoprolol XL, 25 mg BID. I have instructed him to reduce metoprolol to once daily. We will also have him wear a heart monitor x 14 days to further assess rate and rhythm. Return in 3 weeks with Dr. Martinique or APP.     Lyda Jester PA-C 10/27/2016 8:53 AM

## 2016-10-28 ENCOUNTER — Ambulatory Visit (INDEPENDENT_AMBULATORY_CARE_PROVIDER_SITE_OTHER): Payer: Medicare Other

## 2016-10-28 DIAGNOSIS — R5383 Other fatigue: Secondary | ICD-10-CM

## 2016-10-28 DIAGNOSIS — R001 Bradycardia, unspecified: Secondary | ICD-10-CM | POA: Diagnosis not present

## 2016-11-08 ENCOUNTER — Telehealth: Payer: Self-pay | Admitting: Cardiology

## 2016-11-08 NOTE — Telephone Encounter (Signed)
Returned call to patient.Spoke to [employer] at [employer] stated they do not have any Life Time electrodes.Since your skin is so irritated and you have already wore for 12 days ok to mail back to company.

## 2016-11-08 NOTE — Telephone Encounter (Signed)
Pt have been wearing a 14 days monitor for 12 days. The monitor pads have come off and he does not have any more. He wants to know if the 12 days is enough,or should he come get some more pads?

## 2016-11-08 NOTE — Telephone Encounter (Signed)
Returned call to patient.He stated he has wore monitor for 12 days.Stated he has 2 more days to wear wants to know if we have received enough data due to irritated skin.Advised I will check with Shelly monitor tec and call you back.

## 2016-11-17 ENCOUNTER — Encounter: Payer: Self-pay | Admitting: Physician Assistant

## 2016-11-17 ENCOUNTER — Ambulatory Visit (INDEPENDENT_AMBULATORY_CARE_PROVIDER_SITE_OTHER): Payer: Medicare Other | Admitting: Physician Assistant

## 2016-11-17 VITALS — BP 98/58 | HR 52 | Ht 69.0 in | Wt 179.8 lb

## 2016-11-17 DIAGNOSIS — I10 Essential (primary) hypertension: Secondary | ICD-10-CM | POA: Diagnosis not present

## 2016-11-17 DIAGNOSIS — R001 Bradycardia, unspecified: Secondary | ICD-10-CM | POA: Diagnosis not present

## 2016-11-17 DIAGNOSIS — Z79899 Other long term (current) drug therapy: Secondary | ICD-10-CM | POA: Diagnosis not present

## 2016-11-17 DIAGNOSIS — E785 Hyperlipidemia, unspecified: Secondary | ICD-10-CM | POA: Diagnosis not present

## 2016-11-17 MED ORDER — ROSUVASTATIN CALCIUM 10 MG PO TABS: 10.0000 mg | ORAL_TABLET | Freq: Every day | ORAL | 3 refills | Status: DC

## 2016-11-17 NOTE — Progress Notes (Signed)
Cardiology Office Note    Date:  11/17/2016   ID:  Torez, Bratland 31-Jul-1946, MRN [id number]  PCP:  [name], MD  Cardiologist:  Dr. Martinique  Chief Complaint  Patient presents with  . Follow-up    Seen for Dr. Martinique, monitor results    History of Present Illness:  Robert [name] is a 71 y.o. male with PMH of colon cancer s/p partial colon resection 2002/2004, HTN and HLD. He reportedly had cardiac catheterization in 1995 and also 1998, both were reportedly normal. Stress test in June 2014 was also normal with normal EF. He was last seen in the clinic on 10/27/2016 of bradycardia and fatigue. Apparently he was monitoring his heart rate at home, usually running in the 40s to low 60s. Blood pressure during the previous visit was 98/58. He was on multiple medication that could have influenced the heart rate, including Cardizem 120, and metoprolol XL 25 mg twice a day. His metoprolol was cut back to daily dosing. He was placed on 2 weeks event monitor as well.   He presents today for follow-up. His 2 weeks event monitor showed an average heart rate of 64 5. Lowest heart rate was 50. He has been feeling quite well after reducing Toprol-XL dose. He has been on the combination of Toprol-XL and diltiazem for many years to help control palpitation. He exercises in the gym 4 times a week with lifting and treadmill. He walks up to 2 miles during a each exercise. I have advised him to keep adequate hydration. He says he occasionally still have flushing sensation despite discontinuing the niacin. He think the new formulary for the Crestor was causing some issue, he has discussed with his pharmacy and obtained the old Crestor formulary. He has lower flushing sensation now. He did have another episode last night when he checked his blood pressure was in the 130s so he actually took 12.5 mg of Toprol-XL. Otherwise majority of the time his blood pressure is in the 120s and his heart rate is ranging  in the 50s to 60s. He has not experienced any angina with exercise. He can follow-up with Dr. Martinique in 3-4 month period I did recommend a recheck fasting lipid panel and LFTs in 6 weeks. He has been restarted on Crestor 10 mg for 2 weeks now.   Past Medical History:  Diagnosis Date  . Anxiety   . Arthritis    Knee , Neck  . Colon cancer [facility])    a. s/p colon resection in 2004 and 2002  . Edema of right foot    ? muscle problem to be addressed after left knee TKA on 01/06/2015 per patient   . History of kidney stones   . Hyperlipidemia   . Hypertension   . 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 Nocturia associated with benign prostatic hypertrophy 03/21/2013  . Palpitations   . S/P cardiac catheterization 1995 and 1998   a. following abnormal stress tests. Both reportedly normal;  b. 02/2013 nl myoview.  . Sleep apnea with use of continuous positive airway pressure (CPAP)    wears CPAP nightly    Past Surgical History:  Procedure Laterality Date  . CALCANEAL OSTEOTOMY Right 04/29/2016   Procedure: RIGHT CALCANEAL OSTEOTOMY, RIGHT MEDIAL CUNEIFORM OSTEOTOMY;  Surgeon: Wylene Simmer, MD;  Location: [place];  Service: Orthopedics;  Laterality: Right;  . CARDIAC CATHETERIZATION  07/04/1997  . COLON RESECTION  2004   x  2 per patient   . COLON SURGERY     colon resection  . COLONOSCOPY    . ELBOW SURGERY Left 2011   INFECTED OLECRANON BURSITIS SURGERY  . GASTROCNEMIUS RECESSION Right 04/29/2016   Procedure: RIGHT GASTROC RECESSION;  Surgeon: Wylene Simmer, MD;  Location: [place];  Service: Orthopedics;  Laterality: Right;  . INGUINAL HERNIA REPAIR Right 06/03/2015   Procedure: REPAIR RIGHT INGUINAL HERNIA;  Surgeon: Georganna Skeans, MD;  Location: [place];  Service: General;  Laterality: Right;  . INSERTION OF MESH Right 06/03/2015   Procedure: INSERTION OF MESH;  Surgeon: Georganna Skeans, MD;  Location: [place];  Service: General;  Laterality:  Right;  . JOINT REPLACEMENT Left   . L4 DISCECTOMY    . RETINAL DETACHMENT SURGERY  1998  . SEPTIC JOINT Left    [place]  . TOTAL KNEE ARTHROPLASTY Left 01/06/2015   Procedure: LEFT TOTAL KNEE ARTHROPLASTY;  Surgeon: Gaynelle Arabian, MD;  [facility];  Service: Orthopedics;  Laterality: Left;  . TOTAL KNEE ARTHROPLASTY Right 10/03/2015   Procedure: RIGHT TOTAL KNEE ARTHROPLASTY;  Surgeon: Gaynelle Arabian, MD;  [facility];  Service: Orthopedics;  Laterality: Right;  . TRANSURETHRAL RESECTION OF PROSTATE      Current Medications: Outpatient Medications Prior to Visit  Medication Sig Dispense Refill  . aspirin EC 81 MG tablet Take 1 tablet (81 mg total) by mouth 2 (two) times daily. (Patient taking differently: Take 81 mg by mouth daily. ) 84 tablet 0  . diltiazem (CARDIZEM CD) 180 MG 24 hr capsule Take 1 capsule (180 mg total) by mouth at bedtime. 90 capsule 2  . diphenhydrAMINE (BENYLIN) 12.5 MG/5ML syrup Take 12.5 mg by mouth at bedtime as needed for sleep.     . Folic Acid-Vit Q000111Q 123456 (FOLBEE) 2.5-25-1 MG TABS tablet Take 1 tablet by mouth daily.    Marland Kitchen lisinopril-hydrochlorothiazide (PRINZIDE,ZESTORETIC) 20-12.5 MG tablet Take 1 tablet by mouth every morning. 90 tablet 3  . LORazepam (ATIVAN) 0.5 MG tablet Take 0.5 mg by mouth daily as needed for anxiety. When heart races    . metoprolol succinate (TOPROL-XL) 25 MG 24 hr tablet Take 1 tablet (25 mg total) by mouth daily. 180 tablet 2  . Multiple Vitamin (MULTIVITAMIN WITH MINERALS) TABS tablet Take 1 tablet by mouth daily.    . NON FORMULARY CPAP MACHINE    . Omega-3 Fatty Acids (FISH OIL) 1000 MG CPDR Take 1,400 mg by mouth.      No facility-administered medications prior to visit.      Allergies:   Betadine [povidone iodine] and Oxycodone   Social History   Social History  . Marital status: Married    Spouse name: [name]  . Number of children: 3  . Years of education: Ph.D   Occupational History  . ADMIN Oncologist   Social History Main Topics  . Smoking status: Never Smoker  . Smokeless tobacco: Never Used  . Alcohol use 10.2 oz/week    8 Glasses of wine, 8 Shots of liquor, 1 Standard drinks or equivalent per week     Comment: each night 1 glass  . Drug use: No  . Sexual activity: Yes   Other Topics Concern  . None   Social History Narrative   Sleep apnea 28 AHI was reduced to 3.4- set to  7 cm water pressure on nasal mask eson or wisp.  He sleep  nonsupine, less nocturia.  Tennis ball  method explained.  Patient does not want machine set to a certain pressure.     Downloaded CMS compliance s etsablsihed .  Insomnia not improved further - will try seroquel as  medication approach and discussed behaviour therapy approach .   send for n beahiour therapy and started trial of SEROQUEL>  25 minute visit including CMS compliance and downloading.    Patient is married [name]) and lives at home with his wife.   Patient has three children and his wife has three children.   Patient is working full-time.   Patient has a Ph.D.   Patient is right-handed.   Patient drinks about 21 oz of coffee daily.           Family History:  The patient's family history includes Emphysema in his mother; Heart attack in his father; High Cholesterol in his other; Hypertension in his father and other.   ROS:   Please see the history of present illness.    ROS All other systems reviewed and are negative.   PHYSICAL EXAM:   VS:  BP (!) 98/58 (BP Location: Left Arm, Patient Position: Sitting, Cuff Size: Normal)   Pulse (!) 52   Ht 5\' 9"  (1.753 m)   Wt 179 lb 12.8 oz (81.6 kg)   BMI 26.55 kg/m    GEN: Well nourished, well developed, in no acute distress  HEENT: normal  Neck: no JVD, carotid bruits, or masses Cardiac: RRR; no murmurs, rubs, or gallops,no edema  Respiratory:  clear to auscultation bilaterally, normal work of breathing GI: soft, nontender, nondistended, + BS MS: no  deformity or atrophy  Skin: warm and dry, no rash Neuro:  Alert and Oriented x 3, Strength and sensation are intact Psych: euthymic mood, full affect  Wt Readings from Last 3 Encounters:  11/17/16 179 lb 12.8 oz (81.6 kg)  10/27/16 178 lb (80.7 kg)  08/31/16 173 lb (78.5 kg)      Studies/Labs Reviewed:   EKG:  EKG is not ordered today.   Recent Labs: 08/26/2016: ALT 27; BUN 23; Creat 1.04; Hemoglobin 14.8; Platelets 171; Potassium 4.5; Sodium 136   Lipid Panel    Component Value Date/Time   CHOL 186 08/26/2016 0941   TRIG 161 (H) 08/26/2016 0941   HDL 48 08/26/2016 0941   CHOLHDL 3.9 08/26/2016 0941   VLDL 32 (H) 08/26/2016 0941   LDLCALC 106 (H) 08/26/2016 0941    Additional studies/ records that were reviewed today include:   2 weeks cardiac monitor, no pulse noted over 3 seconds or longer, all sinus rhythm, no significant ventricular ectopy. Average heart rate 65, minimal heart rate 50.   Myoview 03/22/2013 Impression Exercise Capacity:  Good exercise capacity. BP Response:  Normal blood pressure response. Clinical Symptoms:  No chest pain or dyspnea. ECG Impression:  No significant ST segment change suggestive of ischemia. Comparison with Prior Nuclear Study: No previous nuclear study performed  Overall Impression:  Low risk stress nuclear study with a moderate size, medium intensity, fixed inferior defect consistent with diaphragmatic attenuation vs prior infarct; no significant ischemia.  LV Ejection Fraction: 68%.  LV Wall Motion:  NL LV Function; NL Wall Motion   ASSESSMENT:    1. Bradycardia   2. Dyslipidemia   3. Medication management   4. Essential hypertension      PLAN:  In order of problems listed above:  1. Bradycardia: Symptom improved after cutting back Toprol-XL from 25mg  twice a day to 25 mg daily. His low blood  pressure also improved, with average daily systolic blood pressure in the 120 to 130s. His blood pressure is borderline at  98/58 this morning, on recheck it was 100/57, he says some flushing last night and his blood pressure was in the 130s, he actually took a half a tablet of Toprol-XL that may have caused her blood pressure to drop again. I would not recommend any change in the blood pressure medication. His 2 weeks monitor also shows his minimal heart rate is no longer in the 40s, slowest heart rate was 50 bpm, average heart rate was 65. Symptomatically, he is doing much better as well. Taking on current regimen of medication.  2. Hypertension: Blood pressure borderline this morning, he actually took a half extra dose of Toprol-XL last night for flushing sensation. Normally he says his blood pressure ranges in the 120s to 130s after cutting back on the Toprol-XL during the last visit.  3. Hyperlipidemia: He is restarted on Crestor 10 mg 2 weeks ago. He says his flushing sensations was likely related to the new formulary of Crestor, after he switch back to the old formulary, he symptom has decreased significantly. I will recheck a fasting lipid panel and LFTs in 6 weeks. He has already stopped Niacin without noticing significant change.    Medication Adjustments/Labs and Tests Ordered: Current medicines are reviewed at length with the patient today.  Concerns regarding medicines are outlined above.  Medication changes, Labs and Tests ordered today are listed in the Patient Instructions below. Patient Instructions  Almyra Deforest, PA recommends that you continue on your current medications as directed. Please refer to the Current Medication list given to you today.  Your physician recommends that you return for lab work in 6 weeks - FASTING.  Isaac Laud recommends that you schedule a follow-up appointment in 3-4 months with Dr Martinique.  If you need a refill on your cardiac medications before your next appointment, please call your pharmacy.    Hilbert Corrigan, [place]  11/17/2016 3:12 PM    [facility] [address] [place], [place], [place]  [address] Phone: [phone]; Fax: [phone]

## 2016-11-17 NOTE — Patient Instructions (Signed)
Almyra Deforest, [place] recommends that you continue on your current medications as directed. Please refer to the Current Medication list given to you today.  Your physician recommends that you return for lab work in 6 weeks - FASTING.  Isaac Laud recommends that you schedule a follow-up appointment in 3-4 months with Dr Martinique.  If you need a refill on your cardiac medications before your next appointment, please call your pharmacy.

## 2017-01-27 LAB — LIPID PANEL
CHOL/HDL RATIO: 3.5 ratio (ref <5.0)
CHOLESTEROL: 183 mg/dL (ref <200)
HDL: 53 mg/dL (ref >40)
LDL Cholesterol: 96 mg/dL (ref <100)
TRIGLYCERIDES: 168 mg/dL — AB (ref <150)
VLDL: 34 mg/dL — ABNORMAL HIGH (ref <30)

## 2017-01-27 LAB — HEPATIC FUNCTION PANEL
ALT: 36 U/L (ref 9–46)
AST: 25 U/L (ref 10–35)
Albumin: 4.4 g/dL (ref 3.6–5.1)
Alkaline Phosphatase: 77 U/L (ref 40–115)
BILIRUBIN DIRECT: 0.1 mg/dL (ref <=0.2)
Indirect Bilirubin: 0.6 mg/dL (ref 0.2–1.2)
TOTAL PROTEIN: 6.8 g/dL (ref 6.1–8.1)
Total Bilirubin: 0.7 mg/dL (ref 0.2–1.2)

## 2017-02-12 NOTE — Progress Notes (Signed)
Robert [name] Date of Birth: 01/17/1946   History of Present Illness: Robert [name] is seen today for followup HTN and HL. He is now retired. His symptoms of facial flushing have resolved.  Thought this might be related to formulation of Crestor. Went back to old formulation and this improved. Was seen in January with concerns of bradycardia and fatigue. Toprol dose was reduced with improvement. Wore a HR monitor showing average rate of 64 with slowest rate of 50.  No chest pain or SOB. No cough. Now that he is retired he is working out at [employer] 4 days/week. Feels much less stressed. Learning to play the banjo. BP at home 120/66.  Current Outpatient Prescriptions on File Prior to Visit  Medication Sig Dispense Refill  . acetaminophen (TYLENOL) 500 MG tablet Take 500 mg by mouth every 8 (eight) hours as needed.    Marland Kitchen aspirin EC 81 MG tablet Take 1 tablet (81 mg total) by mouth 2 (two) times daily. (Patient taking differently: Take 81 mg by mouth daily. ) 84 tablet 0  . CIALIS 5 MG tablet Take 5 mg by mouth daily.    Marland Kitchen diltiazem (CARDIZEM CD) 180 MG 24 hr capsule Take 1 capsule (180 mg total) by mouth at bedtime. 90 capsule 2  . diphenhydrAMINE (BENYLIN) 12.5 MG/5ML syrup Take 12.5 mg by mouth at bedtime as needed for sleep.     . Folic Acid-Vit U2-GUR K27 (FOLBEE) 2.5-25-1 MG TABS tablet Take 1 tablet by mouth daily.    Marland Kitchen ibuprofen (ADVIL,MOTRIN) 200 MG tablet Take 200 mg by mouth every 8 (eight) hours as needed.    Marland Kitchen lisinopril-hydrochlorothiazide (PRINZIDE,ZESTORETIC) 20-12.5 MG tablet Take 1 tablet by mouth every morning. 90 tablet 3  . LORazepam (ATIVAN) 0.5 MG tablet Take 0.5 mg by mouth daily as needed for anxiety. When heart races    . metoprolol succinate (TOPROL-XL) 25 MG 24 hr tablet Take 1 tablet (25 mg total) by mouth daily. 180 tablet 2  . Multiple Vitamin (MULTIVITAMIN WITH MINERALS) TABS tablet Take 1 tablet by mouth daily.    . NON FORMULARY CPAP MACHINE    . Omega-3 Fatty Acids  (FISH OIL) 1000 MG CPDR Take 1,400 mg by mouth.     . rosuvastatin (CRESTOR) 10 MG tablet Take 1 tablet (10 mg total) by mouth daily. 90 tablet 3  . [DISCONTINUED] hyoscyamine (LEVSIN SL) 0.125 MG SL tablet      No current facility-administered medications on file prior to visit.     Allergies  Allergen Reactions  . Betadine [Povidone Iodine] Other (See Comments)    Causes stinging of skin   . Oxycodone Other (See Comments)    nightmares    Past Medical History:  Diagnosis Date  . Anxiety   . Arthritis    Knee , Neck  . Colon cancer [facility])    a. s/p colon resection in 2004 and 2002  . Edema of right foot    ? muscle problem to be addressed after left knee TKA on 01/06/2015 per patient   . History of kidney stones   . Hyperlipidemia   . Hypertension   . 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 Nocturia associated with benign prostatic hypertrophy 03/21/2013  . Palpitations   . S/P cardiac catheterization 1995 and 1998   a. following abnormal stress tests. Both reportedly normal;  b. 02/2013 nl myoview.  . Sleep apnea with use of continuous positive airway pressure (CPAP)  wears CPAP nightly    Past Surgical History:  Procedure Laterality Date  . CALCANEAL OSTEOTOMY Right 04/29/2016   Procedure: RIGHT CALCANEAL OSTEOTOMY, RIGHT MEDIAL CUNEIFORM OSTEOTOMY;  Surgeon: Wylene Simmer, MD;  Location: [place];  Service: Orthopedics;  Laterality: Right;  . CARDIAC CATHETERIZATION  07/04/1997  . COLON RESECTION  2004   x 2 per patient   . COLON SURGERY     colon resection  . COLONOSCOPY    . ELBOW SURGERY Left 2011   INFECTED OLECRANON BURSITIS SURGERY  . GASTROCNEMIUS RECESSION Right 04/29/2016   Procedure: RIGHT GASTROC RECESSION;  Surgeon: Wylene Simmer, MD;  Location: [place];  Service: Orthopedics;  Laterality: Right;  . INGUINAL HERNIA REPAIR Right 06/03/2015   Procedure: REPAIR RIGHT INGUINAL HERNIA;  Surgeon: Georganna Skeans, MD;   Location: [place];  Service: General;  Laterality: Right;  . INSERTION OF MESH Right 06/03/2015   Procedure: INSERTION OF MESH;  Surgeon: Georganna Skeans, MD;  Location: [place];  Service: General;  Laterality: Right;  . JOINT REPLACEMENT Left   . L4 DISCECTOMY    . RETINAL DETACHMENT SURGERY  1998  . SEPTIC JOINT Left    [place]  . TOTAL KNEE ARTHROPLASTY Left 01/06/2015   Procedure: LEFT TOTAL KNEE ARTHROPLASTY;  Surgeon: Gaynelle Arabian, MD;  [facility];  Service: Orthopedics;  Laterality: Left;  . TOTAL KNEE ARTHROPLASTY Right 10/03/2015   Procedure: RIGHT TOTAL KNEE ARTHROPLASTY;  Surgeon: Gaynelle Arabian, MD;  Location[facility];  Service: Orthopedics;  Laterality: Right;  . TRANSURETHRAL RESECTION OF PROSTATE      History  Smoking Status  . Never Smoker  Smokeless Tobacco  . Never Used    History  Alcohol Use  . 10.2 oz/week  . 8 Glasses of wine, 8 Shots of liquor, 1 Standard drinks or equivalent per week    Comment: each night 1 glass    Family History  Problem Relation Age of Onset  . Emphysema Mother   . Heart attack Father   . Hypertension Father   . Hypertension Other   . High Cholesterol Other     Review of Systems: As noted in HPI. All other systems are reviewed and are negative.  Physical Exam: BP 130/66 (BP Location: Left Arm, Patient Position: Sitting, Cuff Size: Normal)   Pulse 66   Ht 5\' 9"  (1.753 m)   Wt 177 lb 3.2 oz (80.4 kg)   BMI 26.17 kg/m  He is a pleasant white male in no acute distress. His HEENT exam is unremarkable. He has no JVD or bruits. Lungs are clear. Cardiac exam reveals a regular rate and rhythm without gallop, murmur, or click. Abdomen is soft and nontender. He has no edema. Pedal pulses are good.  LABORATORY DATA: Lab Results  Component Value Date   WBC 4.8 08/26/2016   HGB 14.8 08/26/2016   HCT 44.8 08/26/2016   PLT 171 08/26/2016   GLUCOSE 89 08/26/2016   CHOL 183 01/27/2017   TRIG 168 (H) 01/27/2017   HDL 53 01/27/2017    LDLCALC 96 01/27/2017   ALT 36 01/27/2017   AST 25 01/27/2017   NA 136 08/26/2016   K 4.5 08/26/2016   CL 100 08/26/2016   CREATININE 1.04 08/26/2016   BUN 23 08/26/2016   CO2 27 08/26/2016   TSH 1.13 10/23/2013   PSA 1.00 03/09/2007   INR 0.99 09/25/2015    Assessment / Plan: 1. Hypertension. Blood pressure is under excellent control currently.  Continue current therapy.  2. Hypercholesterolemia. LDL improved from prior.  Continue Crestor 10 mg daily.   3. Coronary calcification noted incidentally on a CT scan. This was in the mid RCA distribution. Stress Myoview study 03/23/13 was negative for ischemia. Ejection fraction was normal. He is asymptomatic. Will plan a follow up ETT. family history of CAD with father dying at 77 with MI. Continue risk factor modification.  4. Obstructive sleep apnea now on CPAP.  Follow up in 6 months.

## 2017-02-14 ENCOUNTER — Ambulatory Visit (INDEPENDENT_AMBULATORY_CARE_PROVIDER_SITE_OTHER): Payer: Medicare Other | Admitting: Cardiology

## 2017-02-14 ENCOUNTER — Encounter: Payer: Self-pay | Admitting: Cardiology

## 2017-02-14 VITALS — BP 130/66 | HR 66 | Ht 69.0 in | Wt 177.2 lb

## 2017-02-14 DIAGNOSIS — I10 Essential (primary) hypertension: Secondary | ICD-10-CM | POA: Diagnosis not present

## 2017-02-14 DIAGNOSIS — E785 Hyperlipidemia, unspecified: Secondary | ICD-10-CM | POA: Diagnosis not present

## 2017-02-14 DIAGNOSIS — I2510 Atherosclerotic heart disease of native coronary artery without angina pectoris: Secondary | ICD-10-CM | POA: Diagnosis not present

## 2017-02-14 NOTE — Patient Instructions (Addendum)
Continue your current therapy  We will schedule you for a stress test  I will see you in 6 months.

## 2017-03-01 ENCOUNTER — Other Ambulatory Visit: Payer: Self-pay | Admitting: Cardiology

## 2017-03-10 ENCOUNTER — Telehealth (HOSPITAL_COMMUNITY): Payer: Self-pay

## 2017-03-10 NOTE — Telephone Encounter (Signed)
Encounter complete. 

## 2017-03-14 ENCOUNTER — Telehealth: Payer: Self-pay | Admitting: Cardiology

## 2017-03-14 NOTE — Telephone Encounter (Signed)
Pt just realized he can't run on treadmill, he can walk and fairly fast, but due to a foot surgery can't do any running, goes to the gym so pretty fit, has ETT tomorrow and needs to know if needs to RS to another type of stress test

## 2017-03-14 NOTE — Telephone Encounter (Signed)
Agree  Advait Buice MD, FACC   

## 2017-03-14 NOTE — Telephone Encounter (Signed)
Spoke with pt states that 04-2016 he had his "foot rebuilt" with Dr Doran Durand at [facility] and states that he is unable to run. He states that he can walk fast and thinks that this will get his heart rate where it needs to be but he wanted to let us know about this before he has the stress test tomorrow. I informed pt that we do not want pt in any way to reinjur his foot, and we can reschedule for Lexiscan if needed if he cannot get his HR where it needs to be. Pt verbalizes understanding of direction. He will try stress testing as scheduled.

## 2017-03-15 ENCOUNTER — Ambulatory Visit (HOSPITAL_COMMUNITY)
Admission: RE | Admit: 2017-03-15 | Discharge: 2017-03-15 | Disposition: A | Discharge status: Home / Self Care | Payer: Medicare Other | Source: Ambulatory Visit | Attending: Cardiovascular Disease | Admitting: Cardiovascular Disease

## 2017-03-15 DIAGNOSIS — I2510 Atherosclerotic heart disease of native coronary artery without angina pectoris: Secondary | ICD-10-CM | POA: Diagnosis not present

## 2017-03-15 DIAGNOSIS — I10 Essential (primary) hypertension: Secondary | ICD-10-CM

## 2017-03-15 DIAGNOSIS — E785 Hyperlipidemia, unspecified: Secondary | ICD-10-CM | POA: Insufficient documentation

## 2017-03-15 LAB — EXERCISE TOLERANCE TEST
CHL CUP MPHR: 150 {beats}/min
CHL CUP RESTING HR STRESS: 51 {beats}/min
CHL RATE OF PERCEIVED EXERTION: 18
CSEPED: 12 min
Estimated workload: 13.4 METS
Exercise duration (sec): 0 s
Peak HR: 151 {beats}/min
Percent HR: 100 %

## 2017-03-16 ENCOUNTER — Other Ambulatory Visit: Payer: Self-pay | Admitting: Internal Medicine

## 2017-03-30 ENCOUNTER — Other Ambulatory Visit: Payer: Self-pay | Admitting: Cardiology

## 2017-04-17 ENCOUNTER — Encounter: Payer: Self-pay | Admitting: Nurse Practitioner

## 2017-04-18 ENCOUNTER — Telehealth: Payer: Self-pay | Admitting: Nurse Practitioner

## 2017-04-18 NOTE — Telephone Encounter (Signed)
Pt had a yearly appt for CPAP f/u scheduled with Dr Brett Fairy for 7/25 but could not come. Dr D is booked out past October. An appt was scheduled with Hoyle Sauer for tomorrow 7/24/@ @ 2:15.

## 2017-04-19 ENCOUNTER — Encounter: Payer: Self-pay | Admitting: Nurse Practitioner

## 2017-04-19 ENCOUNTER — Ambulatory Visit (INDEPENDENT_AMBULATORY_CARE_PROVIDER_SITE_OTHER): Payer: Medicare Other | Admitting: Nurse Practitioner

## 2017-04-19 VITALS — BP 102/53 | HR 78 | Wt 177.4 lb

## 2017-04-19 DIAGNOSIS — Z9989 Dependence on other enabling machines and devices: Secondary | ICD-10-CM

## 2017-04-19 DIAGNOSIS — G4733 Obstructive sleep apnea (adult) (pediatric): Secondary | ICD-10-CM

## 2017-04-19 NOTE — Patient Instructions (Signed)
CPAP compliance 90% Follow-up yearly and when necessary

## 2017-04-19 NOTE — Progress Notes (Signed)
[facility]  PATIENT: Robert [name] DOB: [birth year]   REASON FOR VISIT:follow-up for obstructive sleep apnea, CPAP compliance HISTORY FROM:patient    HISTORY OF PRESENT ILLNESS:Robert [name] begun having fragmented sleep and finally  would wake up 5-7 times at night to relieve himself. He tried Flomax without success and then by the recently changed urologists and 2013. Dr. Jasmine [name] finally suspected that sleep apnea may be a contributing cause. He also had seen a dentist about the same time noticed him falling asleep in the dental chair and urgent to get an workup for obstructive sleep apnea. Dr. Brigitte [name], his primary care physician ordered a sleep study and the patient tested positive for apnea he was titrated to CPAP in a split night procedure the study was performed. The split night procedure took place on 08-16-12 the patient's BMI at that time was 25 his Epworth sleepiness score 4/24 points his neck circumference 15-1/2 inches. He and was to exhalatory at one point. Apnea hypotony index was 18.5 and RDI 18.9 there was no strong REM complement the supine complement. Lowest desaturation was 78% supine REM sleep was 32.8 minutes of desaturation. The patient was titrated to 6 cm water pressure as an AHI of less than 5. He then underwent a  Auto- titration and compliance report through the [name] 2013 into January 2040,  he used his machine 6 hours 30 minutes at night was 100% compliant , his  AHI  was 2.9 in late January 2014 .Patient uses CPAP at 4 to 8 cm water autoset with a residual AHI of 1.7 and average user time 5 hours 27 minutes. Low air leak, 90 days of 93% compliance.He still has nocturia,  TURP procedure 1995, Epworth 5 and FSS 30 points, Patient has found a 'cocktail " that helps him to sleep , using an antihistamine along with prescription sleep aid.   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  He will [facility]  [facility]. He has been having nightmares on unisom. He is treated with cialis for BPH(!). He is 80% better in terms of Nocturia.  He was on a [place] and woke up 3 times at night, but this was better than at home, and he thinks it was due to his higher level of physicial activity. Lorazepam is used PRN.  I would feel unconcerned about him using a benzodiazepine for the rest of his life.  Interval history from 04/20/2016.CD With the couple of just returned from his daughter's wedding, with a very exciting event he is 97% compliance with CPAP use uses an AutoSet between 4 and 8 cm water with 27 m EPR his residual AHI is close to ideal at 1.4 per hour. No adjustments need to be made. He has been losing weight , plans to retire this Friday.  He tore his right posterior tibial tendon and plans to undergo surgery.   UPDATE 07/24/2018[name], 71-year-old male returns for follow-up with history of obstructive sleep  on CPAP. CPAP compliance for 27 days is 90% from 03/19/2017 to/03/2021 2018. Average usage 4 hours and 38 minutes.Auto set between 4 and 8 cm pressure. EPR 2. AHI 0.9. No adjustments need to be made. [facility].He returns for reevaluation REVIEW OF SYSTEMS: Full 14 system review of systems performed and notable only for those listed, all others are neg:  Constitutional: neg  Cardiovascular: neg Ear/Nose/Throat: neg  Skin: neg Eyes:  neg Respiratory: neg Gastroitestinal: neg  Hematology/Lymphatic: neg  Endocrine: neg Musculoskeletal:neg Allergy/Immunology: neg Neurological: neg Psychiatric: neg Sleep :obstructive sleep apnea with CPAP   ALLERGIES: Allergies  Allergen Reactions  . Betadine [Povidone Iodine] Other (See Comments)    Causes stinging of skin   . Oxycodone Other (See Comments)    nightmares    HOME MEDICATIONS: Outpatient Medications Prior to Visit  Medication Sig Dispense Refill  .  acetaminophen (TYLENOL) 500 MG tablet Take 500 mg by mouth every 8 (eight) hours as needed.    Marland Kitchen aspirin EC 81 MG tablet Take 1 tablet (81 mg total) by mouth 2 (two) times daily. (Patient taking differently: Take 81 mg by mouth daily. ) 84 tablet 0  . CIALIS 5 MG tablet Take 5 mg by mouth daily.    Marland Kitchen diltiazem (CARDIZEM CD) 180 MG 24 hr capsule TAKE 1 CAPSULE (180 MG TOTAL) BY MOUTH AT BEDTIME. 90 capsule 2  . diphenhydrAMINE (BENYLIN) 12.5 MG/5ML syrup Take 12.5 mg by mouth at bedtime as needed for sleep.     Marland Kitchen ibuprofen (ADVIL,MOTRIN) 200 MG tablet Take 200 mg by mouth every 8 (eight) hours as needed.    Marland Kitchen lisinopril-hydrochlorothiazide (PRINZIDE,ZESTORETIC) 20-12.5 MG tablet Take 1 tablet by mouth every morning. 90 tablet 3  . LORazepam (ATIVAN) 0.5 MG tablet Take 0.5 mg by mouth daily as needed for anxiety. When heart races    . metoprolol succinate (TOPROL-XL) 25 MG 24 hr tablet Take 1 tablet (25 mg total) by mouth daily. 180 tablet 2  . Multiple Vitamin (MULTIVITAMIN WITH MINERALS) TABS tablet Take 1 tablet by mouth daily.    . NON FORMULARY CPAP MACHINE    . Omega-3 Fatty Acids (FISH OIL) 1000 MG CPDR Take 1,400 mg by mouth.     . rosuvastatin (CRESTOR) 10 MG tablet Take 1 tablet (10 mg total) by mouth daily. 90 tablet 3  . Folic Acid-Vit V3-XTG G26 (FOLBEE) 2.5-25-1 MG TABS tablet Take 1 tablet by mouth daily.    . metoprolol succinate (TOPROL-XL) 25 MG 24 hr tablet TAKE 1 TABLET (25 MG TOTAL) BY MOUTH 2 (TWO) TIMES DAILY. 180 tablet 2   No facility-administered medications prior to visit.     PAST MEDICAL HISTORY: Past Medical History:  Diagnosis Date  . Anxiety   . Arthritis    Knee , Neck  . Colon cancer [facility])    a. s/p colon resection in 2004 and 2002  . Edema of right foot    ? muscle problem to be addressed after left knee TKA on 01/06/2015 per patient   . History of kidney stones   . Hyperlipidemia   . Hypertension   . 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 Nocturia associated with benign prostatic hypertrophy 03/21/2013  . Palpitations   . S/P cardiac catheterization 1995 and 1998   a. following abnormal stress tests. Both reportedly normal;  b. 02/2013 nl myoview.  . Sleep apnea with use of continuous positive airway pressure (CPAP)    wears CPAP nightly    PAST SURGICAL HISTORY: Past Surgical History:  Procedure Laterality Date  . CALCANEAL OSTEOTOMY Right 04/29/2016   Procedure: RIGHT CALCANEAL OSTEOTOMY, RIGHT MEDIAL CUNEIFORM OSTEOTOMY;  Surgeon: Wylene Simmer, MD;  Location: [place];  Service: Orthopedics;  Laterality: Right;  . CARDIAC CATHETERIZATION  07/04/1997  . COLON RESECTION  2004   x 2 per patient   . COLON SURGERY     colon resection  .  COLONOSCOPY    . ELBOW SURGERY Left 2011   INFECTED OLECRANON BURSITIS SURGERY  . FOOT SURGERY Right   . GASTROCNEMIUS RECESSION Right 04/29/2016   Procedure: RIGHT GASTROC RECESSION;  Surgeon: Wylene Simmer, MD;  Location: [place];  Service: Orthopedics;  Laterality: Right;  . INGUINAL HERNIA REPAIR Right 06/03/2015   Procedure: REPAIR RIGHT INGUINAL HERNIA;  Surgeon: Georganna Skeans, MD;  Location: [place];  Service: General;  Laterality: Right;  . INSERTION OF MESH Right 06/03/2015   Procedure: INSERTION OF MESH;  Surgeon: Georganna Skeans, MD;  Location: [place];  Service: General;  Laterality: Right;  . JOINT REPLACEMENT Left   . L4 DISCECTOMY    . RETINAL DETACHMENT SURGERY  1998  . SEPTIC JOINT Left    [place]  . TOTAL KNEE ARTHROPLASTY Left 01/06/2015   Procedure: LEFT TOTAL KNEE ARTHROPLASTY;  Surgeon: Gaynelle Arabian, MD;  [facility];  Service: Orthopedics;  Laterality: Left;  . TOTAL KNEE ARTHROPLASTY Right 10/03/2015   Procedure: RIGHT TOTAL KNEE ARTHROPLASTY;  Surgeon: Gaynelle Arabian, MD;  Location[facility];  Service: Orthopedics;  Laterality: Right;  . TRANSURETHRAL RESECTION OF PROSTATE      FAMILY HISTORY: Family History  Problem Relation Age of  Onset  . Emphysema Mother   . Heart attack Father   . Hypertension Father   . Hypertension Other   . High Cholesterol Other     SOCIAL HISTORY: Social History   Social History  . Marital status: Married    Spouse name: [name]  . Number of children: 3  . Years of education: Ph.D   Occupational History  . ADMIN Designer, [employer]   Social History Main Topics  . Smoking status: Never Smoker  . Smokeless tobacco: Never Used  . Alcohol use 10.2 oz/week    8 Glasses of wine, 8 Shots of liquor, 1 Standard drinks or equivalent per week     Comment: each night 1 glass  . Drug use: No  . Sexual activity: Yes   Other Topics Concern  . Not on file   Social History Narrative   Sleep apnea 28 AHI was reduced to 3.4- set to  7 cm water pressure on nasal mask eson or wisp.  He sleep  nonsupine, less nocturia.  Tennis ball method explained.  Patient does not want machine set to a certain pressure.     Downloaded CMS compliance s etsablsihed .  Insomnia not improved further - will try seroquel as  medication approach and discussed behaviour therapy approach .   send for n beahiour therapy and started trial of SEROQUEL>  25 minute visit including CMS compliance and downloading.    Patient is married [name]) and lives at home with his wife.   Patient has three children and his wife has three children.   Patient is working full-time.   Patient has a Ph.D.   Patient is right-handed.   Patient drinks about 21 oz of coffee daily.           PHYSICAL EXAM  Vitals:   04/19/17 1416  BP: (!) 102/53  [name]: 78  Weight: 177 lb 6.4 oz (80.5 kg)   Body mass index is 26.2 kg/m.  Generalized: Well developed, in no acute distress  Head: normocephalic and atraumatic,. Oropharynx benign  Neck: Supple,  Musculoskeletal: No deformity   Neurological examination   Mentation: Alert oriented to time, place, history taking. Attention span and concentration appropriate. Recent  and  remote memory intact.  Follows all commands speech and language fluent.   Cranial nerve II-XII: .Pupils were equal round reactive to light extraocular movements were full, visual field were full on confrontational test. Facial sensation and strength were normal. hearing was intact to finger rubbing bilaterally. Uvula tongue midline. head turning and shoulder shrug were normal and symmetric.Tongue protrusion into cheek strength was normal. Motor: normal bulk and tone, full strength in the BUE, BLE, fine finger movements normal, no pronator drift. No focal weakness Sensory: normal and symmetric to light touch,in the upper and lower extremities Coordination: finger-nose-finger, heel-to-shin bilaterally, no dysmetria Reflexes: symmetric upper and, plantar responses were flexor bilaterally. Gait and Station: Rising up from seated position without assistance, normal stance,  moderate stride, good arm swing, smooth turning, able to perform tiptoe, and heel walking without difficulty. Tandem gait is steady  DIAGNOSTIC DATA (LABS, IMAGING, TESTING) - I reviewed patient records, labs, notes, testing and imaging myself where available.  Lab Results  Component Value Date   WBC 4.8 08/26/2016   HGB 14.8 08/26/2016   HCT 44.8 08/26/2016   MCV 88.4 08/26/2016   PLT 171 08/26/2016      Component Value Date/Time   NA 136 08/26/2016 0941   K 4.5 08/26/2016 0941   CL 100 08/26/2016 0941   CO2 27 08/26/2016 0941   GLUCOSE 89 08/26/2016 0941   BUN 23 08/26/2016 0941   CREATININE 1.04 08/26/2016 0941   CALCIUM 10.5 (H) 08/26/2016 0941   PROT 6.8 01/27/2017 0809   ALBUMIN 4.4 01/27/2017 0809   AST 25 01/27/2017 0809   ALT 36 01/27/2017 0809   ALKPHOS 77 01/27/2017 0809   BILITOT 0.7 01/27/2017 0809   GFRNONAA >60 04/27/2016 1100   GFRAA >60 04/27/2016 1100   Lab Results  Component Value Date   CHOL 183 01/27/2017   HDL 53 01/27/2017   LDLCALC 96 01/27/2017   TRIG 168 (H) 01/27/2017    CHOLHDL 3.5 01/27/2017    Lab Results  Component Value Date   TSH 1.13 10/23/2013      ASSESSMENT AND PLAN  71 y.o. year old male  has a past medical history of Anxiety; Arthritis;  benign prostatic hypertrophy (03/21/2013); Palpitations; S/P cardiac catheterization (1995 and 1998); and Sleep apnea with use of continuous positive airway pressure (CPAP). here to follow-up for his obstructive sleep apnea.CPAP compliance for 27 days is 90% from 03/19/2017 to/03/2021 2018. Average usage 4 hours and 38 minutes.Auto set between 4 and 8 cm pressure. EPR 2. AHI 0.9   PLAN; CPAP compliance 90%results reviewed with patient Follow-up yearly and when necessary Dennie Bible, [facility], [facility], APRN  [facility] [address] [place], [place] [address] [phone]

## 2017-04-20 ENCOUNTER — Ambulatory Visit: Payer: BLUE CROSS/BLUE SHIELD | Admitting: Neurology

## 2017-04-20 NOTE — Progress Notes (Signed)
I agree with the assessment and plan as directed by NP .The patient is known to me .   Adonai Helzer, MD  

## 2017-04-27 ENCOUNTER — Telehealth: Payer: Self-pay | Admitting: Cardiology

## 2017-04-27 NOTE — Telephone Encounter (Signed)
New message       Pt want to talk to Dr Doug Sou nurse to tell her changes he made to his medications

## 2017-04-28 NOTE — Telephone Encounter (Signed)
Returned call to patient he stated he adjusted B/P medications.Stated B/P has been alittle low 105/50,105/55 pulse 90.Stated he decreased lisinopril/hctz 20/12.5 mg to 1/2 tablet daily and increased toprol 25 mg to 1&1/2 tablets daily.Stated B/P today better 118/60,126/67,124/62 pulse 50,59,70.Stated he will continue to monitor B/P.Advised Dr.Jordan out of office this week,I will make him aware.

## 2017-05-05 ENCOUNTER — Telehealth: Payer: Self-pay | Admitting: Cardiology

## 2017-05-05 NOTE — Telephone Encounter (Signed)
Returned call to patient he stated he is on vacation in [place].Stated he is scheduled to go on a cruise this Sat.Stated his pulse normally is 60 beats per min for the last week or more pulse has been 85.Stated he feels awful when pulse is 85.B/P averaging 112/60.Stated he took Lorazepam 0.5 mg 1/2 tablet this afternoon pulse 72.Stated he is concerned since he will be leaving to go on cruise Sat what should he do about elevated pulse.Advised office is closed now and Dr.Jordan out of office until Mon 8/13.Patient was reassured 85 pulse is normal, but he states he just feels awful when pulse is 85..Advised he can take Lorazepam 0.5 mg 1/2 tablet daily when elevated.Stated he will call Dr on call tonight to get advice.

## 2017-05-05 NOTE — Telephone Encounter (Signed)
Deferred to cheryl

## 2017-05-05 NOTE — Telephone Encounter (Signed)
Robert [name] is calling because he spoke with you a week ago about a minor adjustment he made on his medication . But he is on a 2wk vacation and his pulse rate has been spiking up to about 85 in the afternoon and stays for about 3 hrs then goes back down again .  Please call

## 2017-05-26 ENCOUNTER — Ambulatory Visit (INDEPENDENT_AMBULATORY_CARE_PROVIDER_SITE_OTHER): Payer: Medicare Other | Admitting: Physician Assistant

## 2017-05-26 ENCOUNTER — Encounter: Payer: Self-pay | Admitting: Physician Assistant

## 2017-05-26 VITALS — BP 108/54 | HR 55 | Ht 69.0 in | Wt 181.0 lb

## 2017-05-26 DIAGNOSIS — R002 Palpitations: Secondary | ICD-10-CM

## 2017-05-26 DIAGNOSIS — E785 Hyperlipidemia, unspecified: Secondary | ICD-10-CM

## 2017-05-26 DIAGNOSIS — I10 Essential (primary) hypertension: Secondary | ICD-10-CM

## 2017-05-26 DIAGNOSIS — G4733 Obstructive sleep apnea (adult) (pediatric): Secondary | ICD-10-CM | POA: Diagnosis not present

## 2017-05-26 DIAGNOSIS — Z9989 Dependence on other enabling machines and devices: Secondary | ICD-10-CM

## 2017-05-26 MED ORDER — TOPROL XL 25 MG PO TB24: 25.0000 mg | ORAL_TABLET | Freq: Two times a day (BID) | ORAL | 3 refills | Status: DC

## 2017-05-26 NOTE — Progress Notes (Signed)
Cardiology Office Note    Date:  05/26/2017   ID:  Robert, [name] 06-07-1946, MRN [id number]  PCP:  [name], MD  Cardiologist:  Dr. Martinique   Chief Complaint  Patient presents with  . Follow-up    seen for Dr. Martinique.    History of Present Illness:  Robert [name] is a 71 y.o. male with PMH of colon cancer s/p partial colon resection 2002/2004, HTN, HLD and OSA on CPAP. He reportedly had cardiac catheterization in 1995 and also 1998, both were reportedly normal. Stress test in June 2014 was also normal with normal EF. He was last seen in the clinic on 10/27/2016 of bradycardia and fatigue. Apparently he was monitoring his heart rate at home, usually running in the 40s to low 60s. His Toprol-XL was cut back to daily dosing due to borderline blood pressure. Afterward, he wore a 2 week event monitor which showed heart rate remained in the 60s. I saw him again in February 2018, he was feeling well after decreasing Toprol-XL dose. Lipid panel obtained on 01/27/2017 showed well-controlled cholesterol, triglyceride was 168, HDL 53, LDL 96. He was seen by Dr. Martinique on 02/14/2017, he was doing well at the time. Follow-up ETT obtained on 03/15/2017 was low risk. He is currently on CPAP therapy managed by neurology.  Patient presents today for cardiology office visit. Although his Toprol-XL was initially decreased to daily, and was eventually decreased to 12.5 mg daily by October 2017. Since then, he has been noticing more pounding sensation in the chest, therefore he increased the Toprol-XL to 37.5 mg daily. However he continued to have the same symptom, he says he started noticing the medication he is currently taking is no longer the same as the brand name prescription he received last year. A week ago, [facility] name prescription for Toprol-XL. He has since come back to 25 mg twice a day of Toprol-XL. The pounding sensation in his chest has completely  resolved. Even though, he did feel pounding sensation, he says his heart rate at the time is 85. Suspicion for SVT is very low at this time. He did not notice any irregular heartbeat, there is no obvious evidence of atrial fibrillation at this point either. Today's EKG showed sinus rhythm, given resolution of the pounding sensation in his chest, I would not recommend a heart monitor at this time. After many trials, he seems to be set on the current dose of medication including once daily lisinopril-hydrochlorothiazide and 25 mg twice a day of Toprol-XL. No further workup is needed at this time he can follow-up with Dr. Martinique in 6 months.  Of note, I did review his recent lipid panel with him. Triglyceride was borderline high, I recommended diet and exercise. He says due to the recent travel and the medications, he has not gone back to [facility] to exercise for 6 weeks. I encouraged him to resume exercise.   Past Medical History:  Diagnosis Date  . Anxiety   . Arthritis    Knee , Neck  . Colon cancer [facility])    a. s/p colon resection in 2004 and 2002  . Edema of right foot    ? muscle problem to be addressed after left knee TKA on 01/06/2015 per patient   . History of kidney stones   . Hyperlipidemia   . Hypertension   . 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 Nocturia associated  with benign prostatic hypertrophy 03/21/2013  . Palpitations   . S/P cardiac catheterization 1995 and 1998   a. following abnormal stress tests. Both reportedly normal;  b. 02/2013 nl myoview.  . Sleep apnea with use of continuous positive airway pressure (CPAP)    wears CPAP nightly    Past Surgical History:  Procedure Laterality Date  . CALCANEAL OSTEOTOMY Right 04/29/2016   Procedure: RIGHT CALCANEAL OSTEOTOMY, RIGHT MEDIAL CUNEIFORM OSTEOTOMY;  Surgeon: Wylene Simmer, MD;  Location: [place];  Service: Orthopedics;  Laterality: Right;  . CARDIAC CATHETERIZATION  07/04/1997  . COLON  RESECTION  2004   x 2 per patient   . COLON SURGERY     colon resection  . COLONOSCOPY    . ELBOW SURGERY Left 2011   INFECTED OLECRANON BURSITIS SURGERY  . FOOT SURGERY Right   . GASTROCNEMIUS RECESSION Right 04/29/2016   Procedure: RIGHT GASTROC RECESSION;  Surgeon: Wylene Simmer, MD;  Location: [place];  Service: Orthopedics;  Laterality: Right;  . INGUINAL HERNIA REPAIR Right 06/03/2015   Procedure: REPAIR RIGHT INGUINAL HERNIA;  Surgeon: Georganna Skeans, MD;  Location: [place];  Service: General;  Laterality: Right;  . INSERTION OF MESH Right 06/03/2015   Procedure: INSERTION OF MESH;  Surgeon: Georganna Skeans, MD;  Location: [place];  Service: General;  Laterality: Right;  . JOINT REPLACEMENT Left   . L4 DISCECTOMY    . RETINAL DETACHMENT SURGERY  1998  . SEPTIC JOINT Left    [place]  . TOTAL KNEE ARTHROPLASTY Left 01/06/2015   Procedure: LEFT TOTAL KNEE ARTHROPLASTY;  Surgeon: Gaynelle Arabian, MD;  Location: [facility];  Service: Orthopedics;  Laterality: Left;  . TOTAL KNEE ARTHROPLASTY Right 10/03/2015   Procedure: RIGHT TOTAL KNEE ARTHROPLASTY;  Surgeon: Gaynelle Arabian, MD;  Location: [facility];  Service: Orthopedics;  Laterality: Right;  . TRANSURETHRAL RESECTION OF PROSTATE      Current Medications: Outpatient Medications Prior to Visit  Medication Sig Dispense Refill  . acetaminophen (TYLENOL) 500 MG tablet Take 500 mg by mouth every 8 (eight) hours as needed.    Marland Kitchen aspirin EC 81 MG tablet Take 1 tablet (81 mg total) by mouth 2 (two) times daily. (Patient taking differently: Take 81 mg by mouth daily. ) 84 tablet 0  . b complex vitamins tablet Take 1 tablet by mouth daily.    Marland Kitchen CIALIS 5 MG tablet Take 5 mg by mouth daily.    Marland Kitchen diltiazem (CARDIZEM CD) 180 MG 24 hr capsule TAKE 1 CAPSULE (180 MG TOTAL) BY MOUTH AT BEDTIME. 90 capsule 2  . diphenhydrAMINE (BENYLIN) 12.5 MG/5ML syrup Take 12.5 mg by mouth at bedtime as needed for sleep.     Marland Kitchen ibuprofen (ADVIL,MOTRIN) 200 MG tablet  Take 200 mg by mouth every 8 (eight) hours as needed.    Marland Kitchen lisinopril-hydrochlorothiazide (PRINZIDE,ZESTORETIC) 20-12.5 MG tablet Take 1 tablet by mouth every morning. 90 tablet 3  . LORazepam (ATIVAN) 0.5 MG tablet Take 0.5 mg by mouth daily as needed for anxiety. When heart races    . Multiple Vitamin (MULTIVITAMIN WITH MINERALS) TABS tablet Take 1 tablet by mouth daily.    . NON FORMULARY CPAP MACHINE    . Omega-3 Fatty Acids (FISH OIL) 1000 MG CPDR Take 1,400 mg by mouth.     . metoprolol succinate (TOPROL-XL) 25 MG 24 hr tablet Take 1 tablet (25 mg total) by mouth daily. (Patient taking differently: Take 25 mg by mouth 2 (two) times daily. )  180 tablet 2  . rosuvastatin (CRESTOR) 10 MG tablet Take 1 tablet (10 mg total) by mouth daily. 90 tablet 3   No facility-administered medications prior to visit.      Allergies:   Betadine [povidone iodine] and Oxycodone   Social History   Social History  . Marital status: Married    Spouse name: [name]  . Number of children: 3  . Years of education: Ph.D   Occupational History  . ADMIN Designer, multimedia   Social History Main Topics  . Smoking status: Never Smoker  . Smokeless tobacco: Never Used  . Alcohol use 10.2 oz/week    8 Glasses of wine, 8 Shots of liquor, 1 Standard drinks or equivalent per week     Comment: each night 1 glass  . Drug use: No  . Sexual activity: Yes   Other Topics Concern  . None   Social History Narrative   Sleep apnea 28 AHI was reduced to 3.4- set to  7 cm water pressure on nasal mask eson or wisp.  He sleep  nonsupine, less nocturia.  Tennis ball method explained.  Patient does not want machine set to a certain pressure.     Downloaded CMS compliance s etsablsihed .  Insomnia not improved further - will try seroquel as  medication approach and discussed behaviour therapy approach .   send for n beahiour therapy and started trial of SEROQUEL>  25 minute visit including CMS compliance and  downloading.    Patient is married [name]) and lives at home with his wife.   Patient has three children and his wife has three children.   Patient is working full-time.   Patient has a Ph.D.   Patient is right-handed.   Patient drinks about 21 oz of coffee daily.           Family History:  The patient's family history includes Emphysema in his mother; Heart attack in his father; High Cholesterol in his other; Hypertension in his father and other.   ROS:   Please see the history of present illness.    ROS All other systems reviewed and are negative.   PHYSICAL EXAM:   VS:  BP (!) 108/54 (BP Location: Right Arm, Patient Position: Sitting, Cuff Size: Normal)   Pulse (!) 55   Ht 5\' 9"  (1.753 m)   Wt 181 lb (82.1 kg)   BMI 26.73 kg/m    GEN: Well nourished, well developed, in no acute distress  HEENT: normal  Neck: no JVD, carotid bruits, or masses Cardiac: RRR; no murmurs, rubs, or gallops,no edema  Respiratory:  clear to auscultation bilaterally, normal work of breathing GI: soft, nontender, nondistended, + BS MS: no deformity or atrophy  Skin: warm and dry, no rash Neuro:  Alert and Oriented x 3, Strength and sensation are intact Psych: euthymic mood, full affect  Wt Readings from Last 3 Encounters:  05/26/17 181 lb (82.1 kg)  04/19/17 177 lb 6.4 oz (80.5 kg)  02/14/17 177 lb 3.2 oz (80.4 kg)      Studies/Labs Reviewed:   EKG:  EKG is ordered today.  The ekg ordered today demonstrates Normal sinus rhythm, no significant ST-T wave changes. Poor progression in anterior leads.  Recent Labs: 08/26/2016: BUN 23; Creat 1.04; Hemoglobin 14.8; Platelets 171; Potassium 4.5; Sodium 136 01/27/2017: ALT 36   Lipid Panel    Component Value Date/Time   CHOL 183 01/27/2017 0809   TRIG 168 (H) 01/27/2017 0809  HDL 53 01/27/2017 0809   CHOLHDL 3.5 01/27/2017 0809   VLDL 34 (H) 01/27/2017 0809   LDLCALC 96 01/27/2017 0809    Additional studies/ records that were reviewed  today include:   GXT 03/15/2017 Study Highlights    Upsloping ST segment depression ST segment depression was noted during stress in the II, III, aVF, V3, V4 and V5 leads, and returning to baseline after less than 1 minute of recovery.  No T wave inversion was noted during stress.  Negative, adequate stress test.  Excellent exercise capacity.  Blood pressure demonstrated a normal response to exercise.      ASSESSMENT:    1. Palpitations   2. Essential hypertension   3. Hyperlipidemia, unspecified hyperlipidemia type   4. OSA on CPAP      PLAN:  In order of problems listed above:  1. Palpitation: Described not as irregular heart rate but more as a pounding sensation in the chest whenever his heart rate goes up into the 80s. He has now come back to the previous dose of Toprol-XL 25 mg twice a day which seems to be controlling his symptom quite well. He says only a specific brands in has taking in the past worked well for him.  2. Hypertension: Blood pressure stable on current medication  3. Hyperlipidemia: Continue current dose of Crestor. He has not exercised in 6 weeks due to travel, he will need to resume.  4. OSA on CPAP: Managed by neurology    Medication Adjustments/Labs and Tests Ordered: Current medicines are reviewed at length with the patient today.  Concerns regarding medicines are outlined above.  Medication changes, Labs and Tests ordered today are listed in the Patient Instructions below. Patient Instructions  Medication Instructions:   CONTINUE TOPROL XL 25 MG TWICE DAILY  Follow-Up:  Your physician wants you to follow-up in: 6 MONTHS WITH DR Martinique You will receive a reminder letter in the mail two months in advance. If you don't receive a letter, please call our office to schedule the follow-up appointment.   If you need a refill on your cardiac medications before your next appointment, please call your pharmacy.       Hilbert Corrigan, [place]    05/26/2017 11:24 PM    [facility] [address] [place], [place], [place]  [address] Phone: [phone]; Fax: [phone]

## 2017-05-26 NOTE — Patient Instructions (Signed)
Medication Instructions:   CONTINUE TOPROL XL 25 MG TWICE DAILY  Follow-Up:  Your physician wants you to follow-up in: 6 MONTHS WITH DR Martinique You will receive a reminder letter in the mail two months in advance. If you don't receive a letter, please call our office to schedule the follow-up appointment.   If you need a refill on your cardiac medications before your next appointment, please call your pharmacy.

## 2017-06-29 ENCOUNTER — Other Ambulatory Visit: Payer: Self-pay | Admitting: Cardiology

## 2017-07-15 ENCOUNTER — Encounter: Payer: Self-pay | Admitting: Cardiology

## 2017-07-25 ENCOUNTER — Telehealth: Payer: Self-pay | Admitting: Cardiology

## 2017-07-25 NOTE — Telephone Encounter (Signed)
New message  Pt C/O medication issue:  1. Name of Medication: rosuvastatin (CRESTOR) 10 MG tablet(Expired)  2. How are you currently taking this medication (dosage and times per day)? Take 1 tablet (10 mg total) by mouth daily.  3. Are you having a reaction (difficulty breathing--STAT)? No   4. What is your medication issue? [facility]

## 2017-07-25 NOTE — Telephone Encounter (Signed)
Returned call to patient he stated he cannot take crestor 10 mg.[facility]ated [facility] manufactures and they are unable to get.Pharmacist advised to change to pravastatin.Advised Dr.Jordan out of office this week.I will send message to him for advice.

## 2017-07-27 MED ORDER — PRAVASTATIN SODIUM 40 MG PO TABS: 40.0000 mg | ORAL_TABLET | Freq: Every evening | ORAL | 3 refills | Status: DC

## 2017-07-27 NOTE — Telephone Encounter (Signed)
We can switch to pravastatin. Equivalent dose is 40 mg daily.  Adell Koval Martinique MD, [facility]

## 2017-07-27 NOTE — Progress Notes (Addendum)
Robert [name] Date of Birth: Dec 06, 1945   History of Present Illness: Robert [name] is seen today for followup HTN and HL.  He was having  symptoms of facial flushing. Thought this might be related to formulation of Crestor. Went back to old formulation and this improved. Now cannot get that formulation so was switched to pravastatin 40 mg daily. Was seen in January with concerns of bradycardia and fatigue. Toprol dose was reduced with improvement. Wore a HR monitor showing average rate of 64 with slowest rate of 50.  ETT in June 2018 was normal. Later developed palpitations and went back to prior Toprol dose.   On follow up today he is doing very well. No chest pain, palpitations, or SOB. No cough. He is really enjoying retirement and is working out at [employer] 4 days/week. He is experimenting with taking his lisinopril HCT at night to see if this will help his nocturia.   Current Outpatient Medications on File Prior to Visit  Medication Sig Dispense Refill  . acetaminophen (TYLENOL) 500 MG tablet Take 500 mg by mouth every 8 (eight) hours as needed.    Marland Kitchen aspirin EC 81 MG tablet Take 81 mg daily by mouth.    Marland Kitchen b complex vitamins tablet Take 1 tablet by mouth daily.    Marland Kitchen CIALIS 5 MG tablet Take 5 mg by mouth daily.    Marland Kitchen diltiazem (CARDIZEM CD) 180 MG 24 hr capsule TAKE 1 CAPSULE (180 MG TOTAL) BY MOUTH AT BEDTIME. 90 capsule 2  . diphenhydrAMINE (BENYLIN) 12.5 MG/5ML syrup Take 12.5 mg by mouth at bedtime as needed for sleep.     Marland Kitchen ibuprofen (ADVIL,MOTRIN) 200 MG tablet Take 200 mg by mouth every 8 (eight) hours as needed.    Marland Kitchen lisinopril-hydrochlorothiazide (PRINZIDE,ZESTORETIC) 20-12.5 MG tablet TAKE 1 TABLET BY MOUTH EVERY MORNING. 90 tablet 3  . LORazepam (ATIVAN) 0.5 MG tablet Take 0.5 mg by mouth daily as needed for anxiety. When heart races    . Multiple Vitamin (MULTIVITAMIN WITH MINERALS) TABS tablet Take 1 tablet by mouth daily.    . NON FORMULARY CPAP MACHINE    . Omega-3 Fatty Acids  (FISH OIL) 1000 MG CPDR Take 1,400 mg by mouth.     . pravastatin (PRAVACHOL) 40 MG tablet Take 1 tablet (40 mg total) by mouth every evening. 90 tablet 3  . TOPROL XL 25 MG 24 hr tablet Take 1 tablet (25 mg total) by mouth 2 (two) times daily. 180 tablet 3  . [DISCONTINUED] hyoscyamine (LEVSIN SL) 0.125 MG SL tablet      No current facility-administered medications on file prior to visit.     Allergies  Allergen Reactions  . Betadine [Povidone Iodine] Other (See Comments)    Causes stinging of skin   . Oxycodone Other (See Comments)    nightmares    Past Medical History:  Diagnosis Date  . Anxiety   . Arthritis    Knee , Neck  . Colon cancer [facility])    a. s/p colon resection in 2004 and 2002  . Edema of right foot    ? muscle problem to be addressed after left knee TKA on 01/06/2015 per patient   . History of kidney stones   . Hyperlipidemia   . Hypertension   . 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 Nocturia associated with benign prostatic hypertrophy 03/21/2013  . Palpitations   . S/P cardiac catheterization 1995 and 1998   a. following abnormal  stress tests. Both reportedly normal;  b. 02/2013 nl myoview.  . Sleep apnea with use of continuous positive airway pressure (CPAP)    wears CPAP nightly    Past Surgical History:  Procedure Laterality Date  . CARDIAC CATHETERIZATION  07/04/1997  . COLON RESECTION  2004   x 2 per patient   . COLON SURGERY     colon resection  . COLONOSCOPY    . ELBOW SURGERY Left 2011   INFECTED OLECRANON BURSITIS SURGERY  . FOOT SURGERY Right   . JOINT REPLACEMENT Left   . L4 DISCECTOMY    . RETINAL DETACHMENT SURGERY  1998  . SEPTIC JOINT Left    [place]  . TRANSURETHRAL RESECTION OF PROSTATE      Social History   Tobacco Use  Smoking Status Never Smoker  Smokeless Tobacco Never Used    Social History   Substance and Sexual Activity  Alcohol Use Yes  . Alcohol/week: 10.2 oz  . Types: 8 Glasses of wine, 8 Shots  of liquor, 1 Standard drinks or equivalent per week   Comment: each night 1 glass    Family History  Problem Relation Age of Onset  . Emphysema Mother   . Heart attack Father   . Hypertension Father   . Hypertension Other   . High Cholesterol Other     Review of Systems: As noted in HPI. All other systems are reviewed and are negative.  Physical Exam: BP (!) 118/58   Pulse (!) 56   Ht 5\' 9"  (1.753 m)   Wt 181 lb (82.1 kg)   SpO2 96%   BMI 26.73 kg/m  GENERAL:  Well appearing HEENT:  PERRL, EOMI, sclera are clear. Oropharynx is clear. NECK:  No jugular venous distention, carotid upstroke brisk and symmetric, no bruits, no thyromegaly or adenopathy LUNGS:  Clear to auscultation bilaterally CHEST:  Unremarkable HEART:  RRR,  PMI not displaced or sustained,S1 and S2 within normal limits, no S3, no S4: no clicks, no rubs, no murmurs ABD:  Soft, nontender. BS +, no masses or bruits. No hepatomegaly, no splenomegaly EXT:  2 + pulses throughout, no edema, no cyanosis no clubbing SKIN:  Warm and dry.  No rashes NEURO:  Alert and oriented x 3. Cranial nerves II through XII intact. PSYCH:  Cognitively intact    LABORATORY DATA: Lab Results  Component Value Date   WBC 4.8 08/26/2016   HGB 14.8 08/26/2016   HCT 44.8 08/26/2016   PLT 171 08/26/2016   GLUCOSE 89 08/26/2016   CHOL 183 01/27/2017   TRIG 168 (H) 01/27/2017   HDL 53 01/27/2017   LDLCALC 96 01/27/2017   ALT 36 01/27/2017   AST 25 01/27/2017   NA 136 08/26/2016   K 4.5 08/26/2016   CL 100 08/26/2016   CREATININE 1.04 08/26/2016   BUN 23 08/26/2016   CO2 27 08/26/2016   TSH 1.13 10/23/2013   PSA 1.00 03/09/2007   INR 0.99 09/25/2015    Dated 04/07/17: Hgb 14.7  ETT 03/15/17: Study Highlights    Upsloping ST segment depression ST segment depression was noted during stress in the II, III, aVF, V3, V4 and V5 leads, and returning to baseline after less than 1 minute of recovery.  No T wave inversion was  noted during stress.  Negative, adequate stress test.  Excellent exercise capacity.  Blood pressure demonstrated a normal response to exercise.    Assessment / Plan: 1. Hypertension. Blood pressure is under excellent control currently. Continue  current therapy.  2. Hypercholesterolemia. Now switched to pravastatin. Will repeat fasting labs in 3 months.  3. Coronary calcification noted incidentally on a CT scan. This was in the mid RCA distribution. Stress Myoview study 03/23/13 was negative for ischemia. Ejection fraction was normal. ETT in June 2018 was normal. He is asymptomatic. Family history of CAD with father dying at 83 with MI. Continue risk factor modification.  4. Obstructive sleep apnea - on CPAP.  Follow up in 6 months.

## 2017-07-27 NOTE — Telephone Encounter (Signed)
Advised patient, verbalized understanding  

## 2017-08-01 ENCOUNTER — Other Ambulatory Visit: Payer: Self-pay

## 2017-08-01 ENCOUNTER — Encounter: Payer: Self-pay | Admitting: Cardiology

## 2017-08-01 ENCOUNTER — Ambulatory Visit: Payer: Medicare Other | Admitting: Cardiology

## 2017-08-01 VITALS — BP 118/58 | HR 56 | Ht 69.0 in | Wt 181.0 lb

## 2017-08-01 DIAGNOSIS — I2510 Atherosclerotic heart disease of native coronary artery without angina pectoris: Secondary | ICD-10-CM

## 2017-08-01 DIAGNOSIS — I10 Essential (primary) hypertension: Secondary | ICD-10-CM | POA: Diagnosis not present

## 2017-08-01 DIAGNOSIS — E7800 Pure hypercholesterolemia, unspecified: Secondary | ICD-10-CM

## 2017-08-01 DIAGNOSIS — R002 Palpitations: Secondary | ICD-10-CM

## 2017-08-01 NOTE — Patient Instructions (Signed)
Continue your current therapy  I will see you in 6 months.   

## 2017-08-25 ENCOUNTER — Encounter: Payer: Self-pay | Admitting: Cardiology

## 2017-09-12 ENCOUNTER — Telehealth: Payer: Self-pay | Admitting: Cardiology

## 2017-09-12 MED ORDER — ATORVASTATIN CALCIUM 10 MG PO TABS: 10.0000 mg | ORAL_TABLET | Freq: Every day | ORAL | 6 refills | Status: DC

## 2017-09-12 NOTE — Telephone Encounter (Signed)
OK to prescribe lipitor 10 mg daily  Peter Martinique MD, [facility]

## 2017-09-12 NOTE — Telephone Encounter (Signed)
New message   Pt said he has been off of his medication pravastatin for 3 weeks   He said that his pharmacy recommends Lipitor  If Dr.Jordan is okay with this can you fax a prescription

## 2017-09-12 NOTE — Telephone Encounter (Signed)
Returned call to patient.He stated he stopped pravastatin 3 weeks ago due to causing him to red in face.Stated he feels better since he stopped.Stated pharmacist advised him to try lipitor.Message sent to [place] for advice.

## 2017-09-12 NOTE — Telephone Encounter (Signed)
Returned call to patient Dr.Jordan's recommendations given.Patient will have fasting lipid and hepatic panels in 3 months.Advised to call sooner if needed.

## 2017-09-14 DIAGNOSIS — J309 Allergic rhinitis, unspecified: Secondary | ICD-10-CM | POA: Insufficient documentation

## 2017-12-15 LAB — BASIC METABOLIC PANEL
BUN/Creatinine Ratio: 23 (ref 10–24)
BUN: 23 mg/dL (ref 8–27)
CO2: 22 mmol/L (ref 20–29)
Calcium: 10.3 mg/dL — ABNORMAL HIGH (ref 8.6–10.2)
Chloride: 103 mmol/L (ref 96–106)
Creatinine, Ser: 1 mg/dL (ref 0.76–1.27)
GFR calc Af Amer: 88 mL/min/{1.73_m2} (ref >59)
GFR calc non Af Amer: 76 mL/min/{1.73_m2} (ref >59)
GLUCOSE: 96 mg/dL (ref 65–99)
POTASSIUM: 4.4 mmol/L (ref 3.5–5.2)
SODIUM: 138 mmol/L (ref 134–144)

## 2017-12-15 LAB — LIPID PANEL W/O CHOL/HDL RATIO
Cholesterol, Total: 191 mg/dL (ref 100–199)
HDL: 57 mg/dL (ref >39)
LDL Calculated: 114 mg/dL — ABNORMAL HIGH (ref 0–99)
Triglycerides: 100 mg/dL (ref 0–149)
VLDL CHOLESTEROL CAL: 20 mg/dL (ref 5–40)

## 2017-12-15 LAB — HEPATIC FUNCTION PANEL
ALBUMIN: 4.4 g/dL (ref 3.5–4.8)
ALK PHOS: 81 IU/L (ref 39–117)
ALT: 21 IU/L (ref 0–44)
AST: 23 IU/L (ref 0–40)
BILIRUBIN, DIRECT: 0.14 mg/dL (ref 0.00–0.40)
Bilirubin Total: 0.5 mg/dL (ref 0.0–1.2)
TOTAL PROTEIN: 6.5 g/dL (ref 6.0–8.5)

## 2017-12-19 NOTE — Progress Notes (Signed)
Robert [name] Date of Birth: 1946-01-22   History of Present Illness: Robert [name] is seen today for followup HTN and HL.  He was having  symptoms of facial flushing. Later switched to pravastatin and had the same problem. Now on Lipitor 10 mg daily. Was seen in January with concerns of bradycardia and fatigue. Toprol dose was reduced with improvement. Wore a HR monitor showing average rate of 64 with slowest rate of 50.  ETT in June 2018 was normal. Later developed palpitations and went back to prior Toprol dose.   On follow up today he states he feels great. No chest pain, palpitations, or SOB. He is  working out at [employer] 4 days/week. BP has been well controlled.  Current Outpatient Medications on File Prior to Visit  Medication Sig Dispense Refill  . acetaminophen (TYLENOL) 500 MG tablet Take 500 mg by mouth every 8 (eight) hours as needed.    Marland Kitchen aspirin EC 81 MG tablet Take 81 mg daily by mouth.    . CIALIS 5 MG tablet Take 5 mg by mouth daily.    Marland Kitchen diltiazem (CARDIZEM CD) 180 MG 24 hr capsule TAKE 1 CAPSULE (180 MG TOTAL) BY MOUTH AT BEDTIME. 90 capsule 2  . diphenhydrAMINE (BENYLIN) 12.5 MG/5ML syrup Take 12.5 mg by mouth at bedtime as needed for sleep.     Marland Kitchen ibuprofen (ADVIL,MOTRIN) 200 MG tablet Take 200 mg by mouth every 8 (eight) hours as needed.    Marland Kitchen lisinopril-hydrochlorothiazide (PRINZIDE,ZESTORETIC) 20-12.5 MG tablet TAKE 1 TABLET BY MOUTH EVERY MORNING. 90 tablet 3  . LORazepam (ATIVAN) 0.5 MG tablet Take 0.5 mg by mouth daily as needed for anxiety. When heart races    . Multiple Vitamin (MULTIVITAMIN WITH MINERALS) TABS tablet Take 1 tablet by mouth daily.    . NON FORMULARY CPAP MACHINE    . Omega-3 Fatty Acids (FISH OIL) 1000 MG CPDR Take 1,400 mg by mouth.     . TOPROL XL 25 MG 24 hr tablet Take 1 tablet (25 mg total) by mouth 2 (two) times daily. 180 tablet 3  . [DISCONTINUED] hyoscyamine (LEVSIN SL) 0.125 MG SL tablet      No current facility-administered medications  on file prior to visit.     Allergies  Allergen Reactions  . Betadine [Povidone Iodine] Other (See Comments)    Causes stinging of skin   . Oxycodone Other (See Comments)    nightmares    Past Medical History:  Diagnosis Date  . Anxiety   . Arthritis    Knee , Neck  . Colon cancer [facility])    a. s/p colon resection in 2004 and 2002  . Edema of right foot    ? muscle problem to be addressed after left knee TKA on 01/06/2015 per patient   . History of kidney stones   . Hyperlipidemia   . Hypertension   . 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 Nocturia associated with benign prostatic hypertrophy 03/21/2013  . Palpitations   . S/P cardiac catheterization 1995 and 1998   a. following abnormal stress tests. Both reportedly normal;  b. 02/2013 nl myoview.  . Sleep apnea with use of continuous positive airway pressure (CPAP)    wears CPAP nightly    Past Surgical History:  Procedure Laterality Date  . CALCANEAL OSTEOTOMY Right 04/29/2016   Procedure: RIGHT CALCANEAL OSTEOTOMY, RIGHT MEDIAL CUNEIFORM OSTEOTOMY;  Surgeon: Wylene Simmer, MD;  Location: [place];  Service: Orthopedics;  Laterality: Right;  . CARDIAC CATHETERIZATION  07/04/1997  . COLON RESECTION  2004   x 2 per patient   . COLON SURGERY     colon resection  . COLONOSCOPY    . ELBOW SURGERY Left 2011   INFECTED OLECRANON BURSITIS SURGERY  . FOOT SURGERY Right   . GASTROCNEMIUS RECESSION Right 04/29/2016   Procedure: RIGHT GASTROC RECESSION;  Surgeon: Wylene Simmer, MD;  Location: [place];  Service: Orthopedics;  Laterality: Right;  . INGUINAL HERNIA REPAIR Right 06/03/2015   Procedure: REPAIR RIGHT INGUINAL HERNIA;  Surgeon: Georganna Skeans, MD;  Location: [place];  Service: General;  Laterality: Right;  . INSERTION OF MESH Right 06/03/2015   Procedure: INSERTION OF MESH;  Surgeon: Georganna Skeans, MD;  Location: [place];  Service: General;  Laterality: Right;  . JOINT REPLACEMENT Left    . L4 DISCECTOMY    . RETINAL DETACHMENT SURGERY  1998  . SEPTIC JOINT Left    [place]  . TOTAL KNEE ARTHROPLASTY Left 01/06/2015   Procedure: LEFT TOTAL KNEE ARTHROPLASTY;  Surgeon: Gaynelle Arabian, MD;  Location: [facility];  Service: Orthopedics;  Laterality: Left;  . TOTAL KNEE ARTHROPLASTY Right 10/03/2015   Procedure: RIGHT TOTAL KNEE ARTHROPLASTY;  Surgeon: Gaynelle Arabian, MD;  Location: [facility];  Service: Orthopedics;  Laterality: Right;  . TRANSURETHRAL RESECTION OF PROSTATE      Social History   Tobacco Use  Smoking Status Never Smoker  Smokeless Tobacco Never Used    Social History   Substance and Sexual Activity  Alcohol Use Yes  . Alcohol/week: 10.2 oz  . Types: 8 Glasses of wine, 8 Shots of liquor, 1 Standard drinks or equivalent per week   Comment: each night 1 glass    Family History  Problem Relation Age of Onset  . Emphysema Mother   . Heart attack Father   . Hypertension Father   . Hypertension Other   . High Cholesterol Other     Review of Systems: As noted in HPI. All other systems are reviewed and are negative.  Physical Exam: BP 111/60   Pulse 60   Ht 5\' 9"  (1.753 m)   Wt 171 lb (77.6 kg)   BMI 25.25 kg/m  GENERAL:  Well appearing WM in NAD HEENT:  PERRL, EOMI, sclera are clear. Oropharynx is clear. NECK:  No jugular venous distention, carotid upstroke brisk and symmetric, no bruits, no thyromegaly or adenopathy LUNGS:  Clear to auscultation bilaterally CHEST:  Unremarkable HEART:  RRR,  PMI not displaced or sustained,S1 and S2 within normal limits, no S3, no S4: no clicks, no rubs, no murmurs ABD:  Soft, nontender. BS +, no masses or bruits. No hepatomegaly, no splenomegaly EXT:  2 + pulses throughout, no edema, no cyanosis no clubbing SKIN:  Warm and dry.  No rashes NEURO:  Alert and oriented x 3. Cranial nerves II through XII intact. PSYCH:  Cognitively intact      LABORATORY DATA: Lab Results  Component Value Date   WBC 4.8 08/26/2016    HGB 14.8 08/26/2016   HCT 44.8 08/26/2016   PLT 171 08/26/2016   GLUCOSE 96 12/15/2017   CHOL 191 12/15/2017   TRIG 100 12/15/2017   HDL 57 12/15/2017   LDLCALC 114 (H) 12/15/2017   ALT 21 12/15/2017   AST 23 12/15/2017   NA 138 12/15/2017   K 4.4 12/15/2017   CL 103 12/15/2017   CREATININE 1.00 12/15/2017   BUN 23 12/15/2017   CO2  22 12/15/2017   TSH 1.13 10/23/2013   PSA 1.00 03/09/2007   INR 0.99 09/25/2015    Dated 04/07/17: Hgb 14.7  ETT 03/15/17: Study Highlights    Upsloping ST segment depression ST segment depression was noted during stress in the II, III, aVF, V3, V4 and V5 leads, and returning to baseline after less than 1 minute of recovery.  No T wave inversion was noted during stress.  Negative, adequate stress test.  Excellent exercise capacity.  Blood pressure demonstrated a normal response to exercise.    Assessment / Plan: 1. Hypertension. Blood pressure is under excellent control currently. Continue current therapy.  2. Hypercholesterolemia. Now on lipitor but not at goal LDL < 100. Will increase Lipitor to 20 mg daily.  Will repeat fasting labs in 3 months.  3. Coronary calcification noted incidentally on a CT scan. This was in the mid RCA distribution. Stress Myoview study 03/23/13 was negative for ischemia. Ejection fraction was normal. ETT in June 2018 was normal. He is asymptomatic. Family history of CAD with father dying at 77 with MI. Continue risk factor modification.  4. Obstructive sleep apnea - on CPAP.  Follow up in 6 months.

## 2017-12-20 DIAGNOSIS — M545 Low back pain, unspecified: Secondary | ICD-10-CM | POA: Insufficient documentation

## 2017-12-22 ENCOUNTER — Encounter: Payer: Self-pay | Admitting: Cardiology

## 2017-12-22 ENCOUNTER — Ambulatory Visit: Payer: Medicare Other | Admitting: Cardiology

## 2017-12-22 VITALS — BP 111/60 | HR 60 | Ht 69.0 in | Wt 171.0 lb

## 2017-12-22 DIAGNOSIS — I10 Essential (primary) hypertension: Secondary | ICD-10-CM

## 2017-12-22 DIAGNOSIS — E7800 Pure hypercholesterolemia, unspecified: Secondary | ICD-10-CM

## 2017-12-22 DIAGNOSIS — I2510 Atherosclerotic heart disease of native coronary artery without angina pectoris: Secondary | ICD-10-CM | POA: Diagnosis not present

## 2017-12-22 MED ORDER — ATORVASTATIN CALCIUM 20 MG PO TABS: 20.0000 mg | ORAL_TABLET | Freq: Every day | ORAL | 3 refills | Status: DC

## 2017-12-22 NOTE — Patient Instructions (Signed)
Increase lipitor to 20 mg daily  We will repeat lab work in 3 months

## 2018-01-12 ENCOUNTER — Other Ambulatory Visit: Payer: Self-pay | Admitting: Cardiology

## 2018-01-12 NOTE — Telephone Encounter (Signed)
Rx(s) sent to pharmacy electronically.  

## 2018-02-03 ENCOUNTER — Telehealth: Payer: Self-pay | Admitting: Cardiology

## 2018-02-03 NOTE — Telephone Encounter (Signed)
New message:    Pt had some Angina yesterday,none today.He thought he should talked to you about this,it lasted longer than usual.

## 2018-02-07 NOTE — Telephone Encounter (Signed)
Spoke to patient he stated he wanted to let Dr.Jordan know he had a episode of left sided chest pain last Thursday after walking dog 1&1/2 miles.No chest while walking.No sob.No other symptoms.Stated pain lasted appox 5 mins.Stated he has occasional chest pain,but this pain lasted longer.Stated he took 1/2 tablet of lorazepam, rested no chest pain since.

## 2018-02-07 NOTE — Telephone Encounter (Signed)
Advised Dr.Jordan out of office today.I will speak to him tomorrow and call you back.

## 2018-02-08 NOTE — Telephone Encounter (Signed)
Returned call to patient Dr.Jordan advised if he continues to have chest pain to call back.Stated he has not had any more chest pain, he will call back if he has any more.

## 2018-03-13 ENCOUNTER — Encounter: Payer: Self-pay | Admitting: Nurse Practitioner

## 2018-04-19 ENCOUNTER — Ambulatory Visit: Payer: Medicare Other | Admitting: Nurse Practitioner

## 2018-05-29 ENCOUNTER — Other Ambulatory Visit: Payer: Self-pay | Admitting: Physician Assistant

## 2018-05-29 DIAGNOSIS — R002 Palpitations: Secondary | ICD-10-CM

## 2018-06-04 NOTE — Progress Notes (Signed)
Robert [name] Date of Birth: 1946/03/03   History of Present Illness: Robert [name] is seen today for followup HTN and HL.  He was having  symptoms of facial flushing. Later switched to pravastatin and had the same problem. Now on Lipitor 20 mg daily. Was seen in January with concerns of bradycardia and fatigue. Toprol dose was reduced with improvement. Wore a HR monitor showing average rate of 64 with slowest rate of 50.  ETT in June 2018 was normal. Later developed palpitations and went back to prior Toprol dose.   On follow up today he states he feels great. He is downsizing and building a new house. No significant chest pain, palpitations, or SOB. He is walking 3 miles/day.  BP has been well controlled in fact was running low so he reduced his evening Toprol dose.  Current Outpatient Medications on File Prior to Visit  Medication Sig Dispense Refill  . acetaminophen (TYLENOL) 500 MG tablet Take 500 mg by mouth every 8 (eight) hours as needed.    Marland Kitchen aspirin EC 81 MG tablet Take 81 mg daily by mouth.    Marland Kitchen atorvastatin (LIPITOR) 20 MG tablet Take 1 tablet (20 mg total) by mouth daily. 90 tablet 3  . CIALIS 5 MG tablet Take 5 mg by mouth daily.    Marland Kitchen diltiazem (CARDIZEM CD) 180 MG 24 hr capsule TAKE 1 CAPSULE (180 MG TOTAL) BY MOUTH AT BEDTIME. 90 capsule 3  . diphenhydrAMINE (BENYLIN) 12.5 MG/5ML syrup Take 12.5 mg by mouth at bedtime as needed for sleep.     . fluticasone (FLONASE) 50 MCG/ACT nasal spray fluticasone propionate 50 mcg/actuation nasal spray,suspension    . ibuprofen (ADVIL,MOTRIN) 200 MG tablet Take 200 mg by mouth every 8 (eight) hours as needed.    Marland Kitchen lisinopril-hydrochlorothiazide (PRINZIDE,ZESTORETIC) 20-12.5 MG tablet TAKE 1 TABLET BY MOUTH EVERY MORNING. 90 tablet 3  . LORazepam (ATIVAN) 0.5 MG tablet Take 0.5 mg by mouth daily as needed for anxiety. When heart races    . metoprolol succinate (TOPROL-XL) 25 MG 24 hr tablet TAKE 1 TABLET BY MOUTH TWICE A DAY (Patient taking  differently: No sig reported) 180 tablet 3  . Multiple Vitamin (MULTIVITAMIN WITH MINERALS) TABS tablet Take 1 tablet by mouth daily.    . NON FORMULARY CPAP MACHINE    . Omega-3 Fatty Acids (FISH OIL) 1000 MG CPDR Take 1,400 mg by mouth.     . [DISCONTINUED] hyoscyamine (LEVSIN SL) 0.125 MG SL tablet      No current facility-administered medications on file prior to visit.     Allergies  Allergen Reactions  . Betadine [Povidone Iodine] Other (See Comments)    Causes stinging of skin   . Oxycodone Other (See Comments)    nightmares    Past Medical History:  Diagnosis Date  . Anxiety   . Arthritis    Knee , Neck  . Colon cancer [facility])    a. s/p colon resection in 2004 and 2002  . Edema of right foot    ? muscle problem to be addressed after left knee TKA on 01/06/2015 per patient   . History of kidney stones   . Hyperlipidemia   . Hypertension   . 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 Nocturia associated with benign prostatic hypertrophy 03/21/2013  . Palpitations   . S/P cardiac catheterization 1995 and 1998   a. following abnormal stress tests. Both reportedly normal;  b. 02/2013 nl myoview.  . Sleep apnea  with use of continuous positive airway pressure (CPAP)    wears CPAP nightly    Past Surgical History:  Procedure Laterality Date  . CALCANEAL OSTEOTOMY Right 04/29/2016   Procedure: RIGHT CALCANEAL OSTEOTOMY, RIGHT MEDIAL CUNEIFORM OSTEOTOMY;  Surgeon: Wylene Simmer, MD;  Location: [place];  Service: Orthopedics;  Laterality: Right;  . CARDIAC CATHETERIZATION  07/04/1997  . COLON RESECTION  2004   x 2 per patient   . COLON SURGERY     colon resection  . COLONOSCOPY    . ELBOW SURGERY Left 2011   INFECTED OLECRANON BURSITIS SURGERY  . FOOT SURGERY Right   . GASTROCNEMIUS RECESSION Right 04/29/2016   Procedure: RIGHT GASTROC RECESSION;  Surgeon: Wylene Simmer, MD;  Location: [place];  Service: Orthopedics;  Laterality:  Right;  . INGUINAL HERNIA REPAIR Right 06/03/2015   Procedure: REPAIR RIGHT INGUINAL HERNIA;  Surgeon: Georganna Skeans, MD;  Location: [place];  Service: General;  Laterality: Right;  . INSERTION OF MESH Right 06/03/2015   Procedure: INSERTION OF MESH;  Surgeon: Georganna Skeans, MD;  Location: [place];  Service: General;  Laterality: Right;  . JOINT REPLACEMENT Left   . L4 DISCECTOMY    . RETINAL DETACHMENT SURGERY  1998  . SEPTIC JOINT Left    [place]  . TOTAL KNEE ARTHROPLASTY Left 01/06/2015   Procedure: LEFT TOTAL KNEE ARTHROPLASTY;  Surgeon: Gaynelle Arabian, MD;  [facility];  Service: Orthopedics;  Laterality: Left;  . TOTAL KNEE ARTHROPLASTY Right 10/03/2015   Procedure: RIGHT TOTAL KNEE ARTHROPLASTY;  Surgeon: Gaynelle Arabian, MD;  [facility];  Service: Orthopedics;  Laterality: Right;  . TRANSURETHRAL RESECTION OF PROSTATE      Social History   Tobacco Use  Smoking Status Never Smoker  Smokeless Tobacco Never Used    Social History   Substance and Sexual Activity  Alcohol Use Yes  . Alcohol/week: 17.0 standard drinks  . Types: 8 Glasses of wine, 8 Shots of liquor, 1 Standard drinks or equivalent per week   Comment: each night 1 glass    Family History  Problem Relation Age of Onset  . Emphysema Mother   . Heart attack Father   . Hypertension Father   . Hypertension Other   . High Cholesterol Other     Review of Systems: As noted in HPI. All other systems are reviewed and are negative.  Physical Exam: BP (!) 110/50 (BP Location: Left Arm, Patient Position: Sitting)   Pulse (!) 53   Ht 5\' 9"  (1.753 m)   Wt 174 lb (78.9 kg)   BMI 25.70 kg/m  GENERAL:  Well appearing WM in NAD HEENT:  PERRL, EOMI, sclera are clear. Oropharynx is clear. NECK:  No jugular venous distention, carotid upstroke brisk and symmetric, no bruits, no thyromegaly or adenopathy LUNGS:  Clear to auscultation bilaterally CHEST:  Unremarkable HEART:  RRR,  PMI not displaced or sustained,S1 and  S2 within normal limits, no S3, no S4: no clicks, no rubs, no murmurs ABD:  Soft, nontender. BS +, no masses or bruits. No hepatomegaly, no splenomegaly EXT:  2 + pulses throughout, no edema, no cyanosis no clubbing SKIN:  Warm and dry.  No rashes NEURO:  Alert and oriented x 3. Cranial nerves II through XII intact. PSYCH:  Cognitively intact      LABORATORY DATA: Lab Results  Component Value Date   WBC 4.8 08/26/2016   HGB 14.8 08/26/2016   HCT 44.8 08/26/2016   PLT 171  08/26/2016   GLUCOSE 90 06/06/2018   CHOL 163 06/06/2018   TRIG 107 06/06/2018   HDL 53 06/06/2018   LDLCALC 89 06/06/2018   ALT 22 06/06/2018   AST 21 06/06/2018   NA 137 06/06/2018   K 4.9 06/06/2018   CL 100 06/06/2018   CREATININE 0.98 06/06/2018   BUN 20 06/06/2018   CO2 24 06/06/2018   TSH 1.13 10/23/2013   PSA 1.00 03/09/2007   INR 0.99 09/25/2015    Dated 04/07/17: Hgb 14.7  Ecg today shows sinus brady rate 53. Normal. I have personally reviewed and interpreted this study.   ETT 03/15/17: Study Highlights    Upsloping ST segment depression ST segment depression was noted during stress in the II, III, aVF, V3, V4 and V5 leads, and returning to baseline after less than 1 minute of recovery.  No T wave inversion was noted during stress.  Negative, adequate stress test.  Excellent exercise capacity.  Blood pressure demonstrated a normal response to exercise.    Assessment / Plan: 1. Hypertension. Blood pressure is under excellent control currently. Continue current therapy.  2. Hypercholesterolemia. goal LDL < 100. Improved with increase Lipitor to 20 mg daily.   3. Coronary calcification noted incidentally on a CT scan. This was in the mid RCA distribution. Stress Myoview study 03/23/13 was negative for ischemia. Ejection fraction was normal. ETT in June 2018 was normal. He is asymptomatic. Family history of CAD with father dying at 20 with MI. Continue risk factor modification.  4.  Obstructive sleep apnea - on CPAP.  Follow up in 6 months.

## 2018-06-07 LAB — BASIC METABOLIC PANEL
BUN/Creatinine Ratio: 20 (ref 10–24)
BUN: 20 mg/dL (ref 8–27)
CALCIUM: 10.3 mg/dL — AB (ref 8.6–10.2)
CO2: 24 mmol/L (ref 20–29)
Chloride: 100 mmol/L (ref 96–106)
Creatinine, Ser: 0.98 mg/dL (ref 0.76–1.27)
GFR, EST AFRICAN AMERICAN: 89 mL/min/{1.73_m2} (ref >59)
GFR, EST NON AFRICAN AMERICAN: 77 mL/min/{1.73_m2} (ref >59)
Glucose: 90 mg/dL (ref 65–99)
POTASSIUM: 4.9 mmol/L (ref 3.5–5.2)
Sodium: 137 mmol/L (ref 134–144)

## 2018-06-07 LAB — HEPATIC FUNCTION PANEL
ALBUMIN: 4.5 g/dL (ref 3.5–4.8)
ALT: 22 IU/L (ref 0–44)
AST: 21 IU/L (ref 0–40)
Alkaline Phosphatase: 78 IU/L (ref 39–117)
BILIRUBIN TOTAL: 0.4 mg/dL (ref 0.0–1.2)
Bilirubin, Direct: 0.13 mg/dL (ref 0.00–0.40)
Total Protein: 6.5 g/dL (ref 6.0–8.5)

## 2018-06-07 LAB — LIPID PANEL W/O CHOL/HDL RATIO
Cholesterol, Total: 163 mg/dL (ref 100–199)
HDL: 53 mg/dL (ref >39)
LDL Calculated: 89 mg/dL (ref 0–99)
Triglycerides: 107 mg/dL (ref 0–149)
VLDL CHOLESTEROL CAL: 21 mg/dL (ref 5–40)

## 2018-06-08 ENCOUNTER — Encounter: Payer: Self-pay | Admitting: Cardiology

## 2018-06-08 ENCOUNTER — Ambulatory Visit: Payer: Medicare Other | Admitting: Cardiology

## 2018-06-08 VITALS — BP 110/50 | HR 53 | Ht 69.0 in | Wt 174.0 lb

## 2018-06-08 DIAGNOSIS — I2510 Atherosclerotic heart disease of native coronary artery without angina pectoris: Secondary | ICD-10-CM

## 2018-06-08 DIAGNOSIS — I10 Essential (primary) hypertension: Secondary | ICD-10-CM | POA: Diagnosis not present

## 2018-06-08 DIAGNOSIS — E7800 Pure hypercholesterolemia, unspecified: Secondary | ICD-10-CM | POA: Diagnosis not present

## 2018-06-08 NOTE — Patient Instructions (Signed)
Continue your current therapy  I will see you in 6 months.   

## 2018-06-16 ENCOUNTER — Other Ambulatory Visit: Payer: Self-pay | Admitting: Cardiology

## 2018-07-27 ENCOUNTER — Encounter: Payer: Self-pay | Admitting: Nurse Practitioner

## 2018-07-27 NOTE — Progress Notes (Signed)
[facility]  PATIENT: Robert [name] DOB: 1946-05-15   REASON FOR VISIT:follow-up for obstructive sleep apnea, CPAP compliance HISTORY FROM:patient    HISTORY OF PRESENT ILLNESS:Mr. Robert [name] begun having fragmented sleep and finally  would wake up 5-7 times at night to relieve himself. He tried Flomax without success and then by the recently changed urologists and 2013. [name] finally suspected that sleep apnea may be a contributing cause. He also had seen a dentist about the same time noticed him falling asleep in the dental chair and urgent to get an workup for obstructive sleep apnea. Dr. [name], his primary care physician ordered a sleep study and the patient tested positive for apnea he was titrated to CPAP in a split night procedure the study was performed. The split night procedure took place on 08-16-12 the patient's BMI at that time was 25 his Epworth sleepiness score 4/24 points his neck circumference 15-1/2 inches. He and was to exhalatory at one point. Apnea hypotony index was 18.5 and RDI 18.9 there was no strong REM complement the supine complement. Lowest desaturation was 78% supine REM sleep was 32.8 minutes of desaturation. The patient was titrated to 6 cm water pressure as an AHI of less than 5. He then underwent a  Auto- titration and compliance report through the December 2013 into January 2040,  he used his machine 6 hours 30 minutes at night was 100% compliant , his  AHI  was 2.9 in late January 2014 .Patient uses CPAP at 4 to 8 cm water autoset with a residual AHI of 1.7 and average user time 5 hours 27 minutes. Low air leak, 90 days of 93% compliance.He still has nocturia,  TURP procedure 1995, Epworth 5 and FSS 30 points, Patient has found a 'cocktail " that helps him to sleep , using an antihistamine along with prescription sleep aid.   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  He will [facility]. He has been having nightmares on unisom. He is treated with cialis for BPH(!). He is 80% better in terms of Nocturia.  He was on a [place] and woke up 3 times at night, but this was better than at home, and he thinks it was due to his higher level of physicial activity. Lorazepam is used PRN.  I would feel unconcerned about him using a benzodiazepine for the rest of his life.  Interval history from 04/20/2016.CD With the couple of just returned from his daughter's wedding, with a very exciting event he is 97% compliance with CPAP use uses an AutoSet between 4 and 8 cm water with 27 m EPR his residual AHI is close to ideal at 1.4 per hour. No adjustments need to be made. He has been losing weight , plans to retire this Friday.  He tore his right posterior tibial tendon and plans to undergo surgery.   UPDATE 07/24/2018[name], 72 year old male returns for follow-up with history of obstructive sleep  on CPAP. CPAP compliance for 27 days is 90% from 03/19/2017 to/03/2021 2018. Average usage 4 hours and 38 minutes.Auto set between 4 and 8 cm pressure. EPR 2. AHI 0.9. No adjustments need to be made. [facility].He returns for reevaluation UPDATE 11/1/2019[name], 72 year old male returns for follow-up with history of obstructive sleep apnea here for CPAP follow-up.  He is doing well with his CPAP.  Data dated 06/28/2018-07/27/2018 shows compliance  greater than 4 hours at 93%.  Average usage 6 hours 53 minutes.  Set pressure 4 to 8 cm.  EPR level 2 AHI 1.8.  ESS 68F FSS 24.  He returns for reevaluation   REVIEW OF SYSTEMS: Full 14 system review of systems performed and notable only for those listed, all others are neg:  Constitutional: neg  Cardiovascular: neg Ear/Nose/Throat: neg  Skin: neg Eyes: neg Respiratory: neg Gastroitestinal: neg  Hematology/Lymphatic: neg  Endocrine: neg Musculoskeletal: Back pain, neck  stiffness Allergy/Immunology: neg Neurological: neg Psychiatric: neg Sleep :obstructive sleep apnea with CPAP   ALLERGIES: Allergies  Allergen Reactions  . Betadine [Povidone Iodine] Other (See Comments)    Causes stinging of skin   . Oxycodone Other (See Comments)    nightmares    HOME MEDICATIONS: Outpatient Medications Prior to Visit  Medication Sig Dispense Refill  . acetaminophen (TYLENOL) 500 MG tablet Take 500 mg by mouth every 8 (eight) hours as needed.    Marland Kitchen aspirin EC 81 MG tablet Take 81 mg daily by mouth.    Marland Kitchen atorvastatin (LIPITOR) 20 MG tablet Take 1 tablet (20 mg total) by mouth daily. 90 tablet 3  . CIALIS 5 MG tablet Take 5 mg by mouth daily.    Marland Kitchen diltiazem (CARDIZEM CD) 180 MG 24 hr capsule TAKE 1 CAPSULE (180 MG TOTAL) BY MOUTH AT BEDTIME. 90 capsule 3  . diphenhydrAMINE (BENYLIN) 12.5 MG/5ML syrup Take 12.5 mg by mouth at bedtime as needed for sleep.     . fluticasone (FLONASE) 50 MCG/ACT nasal spray fluticasone propionate 50 mcg/actuation nasal spray,suspension    . ibuprofen (ADVIL,MOTRIN) 200 MG tablet Take 200 mg by mouth every 8 (eight) hours as needed.    Marland Kitchen lisinopril-hydrochlorothiazide (PRINZIDE,ZESTORETIC) 20-12.5 MG tablet TAKE 1 TABLET BY MOUTH EVERY MORNING. 90 tablet 3  . LORazepam (ATIVAN) 0.5 MG tablet Take 0.5 mg by mouth daily as needed for anxiety. When heart races    . metoprolol succinate (TOPROL-XL) 25 MG 24 hr tablet TAKE 1 TABLET BY MOUTH TWICE A DAY (Patient taking differently: No sig reported) 180 tablet 3  . Multiple Vitamin (MULTIVITAMIN WITH MINERALS) TABS tablet Take 1 tablet by mouth daily.    . NON FORMULARY CPAP MACHINE    . Omega-3 Fatty Acids (FISH OIL) 1000 MG CPDR Take 1,400 mg by mouth.      No facility-administered medications prior to visit.     PAST MEDICAL HISTORY: Past Medical History:  Diagnosis Date  . Anxiety   . Arthritis    Knee , Neck  . Colon cancer [facility])    a. s/p colon resection in 2004 and 2002  .  Edema of right foot    ? muscle problem to be addressed after left knee TKA on 01/06/2015 per patient   . History of kidney stones   . Hyperlipidemia   . Hypertension   . 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 Nocturia associated with benign prostatic hypertrophy 03/21/2013  . Palpitations   . S/P cardiac catheterization 1995 and 1998   a. following abnormal stress tests. Both reportedly normal;  b. 02/2013 nl myoview.  . Sleep apnea with use of continuous positive airway pressure (CPAP)    wears CPAP nightly    PAST SURGICAL HISTORY: Past Surgical History:  Procedure Laterality Date  . CALCANEAL OSTEOTOMY Right 04/29/2016   Procedure: RIGHT CALCANEAL OSTEOTOMY, RIGHT MEDIAL CUNEIFORM OSTEOTOMY;  Surgeon: Wylene Simmer, MD;  Location: [place];  Service:  Orthopedics;  Laterality: Right;  . CARDIAC CATHETERIZATION  07/04/1997  . COLON RESECTION  2004   x 2 per patient   . COLON SURGERY     colon resection  . COLONOSCOPY    . ELBOW SURGERY Left 2011   INFECTED OLECRANON BURSITIS SURGERY  . FOOT SURGERY Right   . GASTROCNEMIUS RECESSION Right 04/29/2016   Procedure: RIGHT GASTROC RECESSION;  Surgeon: Wylene Simmer, MD;  Location: [place];  Service: Orthopedics;  Laterality: Right;  . INGUINAL HERNIA REPAIR Right 06/03/2015   Procedure: REPAIR RIGHT INGUINAL HERNIA;  Surgeon: Georganna Skeans, MD;  Location: [place];  Service: General;  Laterality: Right;  . INSERTION OF MESH Right 06/03/2015   Procedure: INSERTION OF MESH;  Surgeon: Georganna Skeans, MD;  Location: [place];  Service: General;  Laterality: Right;  . JOINT REPLACEMENT Left   . L4 DISCECTOMY    . RETINAL DETACHMENT SURGERY  1998  . SEPTIC JOINT Left    [place]  . TOTAL KNEE ARTHROPLASTY Left 01/06/2015   Procedure: LEFT TOTAL KNEE ARTHROPLASTY;  Surgeon: Gaynelle Arabian, MD;  Location: [facility];  Service: Orthopedics;  Laterality: Left;  . TOTAL KNEE ARTHROPLASTY Right 10/03/2015   Procedure:  RIGHT TOTAL KNEE ARTHROPLASTY;  Surgeon: Gaynelle Arabian, MD;  Location: [facility];  Service: Orthopedics;  Laterality: Right;  . TRANSURETHRAL RESECTION OF PROSTATE      FAMILY HISTORY: Family History  Problem Relation Age of Onset  . Emphysema Mother   . Heart attack Father   . Hypertension Father   . Hypertension Other   . High Cholesterol Other     SOCIAL HISTORY: Social History   Socioeconomic History  . Marital status: Married    Spouse name: [name]  . Number of children: 3  . Years of education: Ph.D  . Highest education level: Not on file  Occupational History  . Occupation: ADMIN    Employer: [employer]    Comment: [place] Needs  . Financial resource strain: Not on file  . Food insecurity:    Worry: Not on file    Inability: Not on file  . Transportation needs:    Medical: Not on file    Non-medical: Not on file  Tobacco Use  . Smoking status: Never Smoker  . Smokeless tobacco: Never Used  Substance and Sexual Activity  . Alcohol use: Yes    Alcohol/week: 17.0 standard drinks    Types: 8 Glasses of wine, 8 Shots of liquor, 1 Standard drinks or equivalent per week    Comment: each night 1 glass  . Drug use: No  . Sexual activity: Yes  Lifestyle  . Physical activity:    Days per week: Not on file    Minutes per session: Not on file  . Stress: Not on file  Relationships  . Social connections:    Talks on phone: Not on file    Gets together: Not on file    Attends religious service: Not on file    Active member of club or organization: Not on file    Attends meetings of clubs or organizations: Not on file    Relationship status: Not on file  . Intimate partner violence:    Fear of current or ex partner: Not on file    Emotionally abused: Not on file    Physically abused: Not on file    Forced sexual activity: Not on file  Other Topics Concern  . Not on file  Social History Narrative   Sleep apnea 28 AHI was reduced to 3.4- set to   7 cm water pressure on nasal mask eson or wisp.  He sleep  nonsupine, less nocturia.  Tennis ball method explained.  Patient does not want machine set to a certain pressure.     Downloaded CMS compliance s etsablsihed .  Insomnia not improved further - will try seroquel as  medication approach and discussed behaviour therapy approach .   send for n beahiour therapy and started trial of SEROQUEL>  25 minute visit including CMS compliance and downloading.    Patient is married [name]) and lives at home with his wife.   Patient has three children and his wife has three children.   Patient is working full-time.   Patient has a Ph.D.   Patient is right-handed.   Patient drinks about 21 oz of coffee daily.           PHYSICAL EXAM  Vitals:   07/28/18 0804  BP: (!) 102/58  Pulse: (!) 50  Weight: 176 lb 3.2 oz (79.9 kg)  Height: 5\' 9"  (1.753 m)   Body mass index is 26.02 kg/m.  Generalized: Well developed, in no acute distress  Head: normocephalic and atraumatic,. Oropharynx benign mallopatti2 Neck: Supple, circumference 16 Lungs clear Musculoskeletal: No deformity  Skin no rash or edema Neurological examination   Mentation: Alert oriented to time, place, history taking. Attention span and concentration appropriate. Recent and remote memory intact.  Follows all commands speech and language fluent.   Cranial nerve II-XII: .Pupils were equal round reactive to light extraocular movements were full, visual field were full on confrontational test. Facial sensation and strength were normal. hearing was intact to finger rubbing bilaterally. Uvula tongue midline. head turning and shoulder shrug were normal and symmetric.Tongue protrusion into cheek strength was normal. Motor: normal bulk and tone, full strength in the BUE, BLE,  Sensory: normal and symmetric to light touch,in the upper and lower extremities Coordination: finger-nose-finger, heel-to-shin bilaterally, no dysmetria Gait and  Station: Rising up from seated position without assistance, normal stance,  moderate stride, good arm swing, smooth turning, able to perform tiptoe, and heel walking without difficulty. Tandem gait is steady  DIAGNOSTIC DATA (LABS, IMAGING, TESTING) - I reviewed patient records, labs, notes, testing and imaging myself where available.  Lab Results  Component Value Date   WBC 4.8 08/26/2016   HGB 14.8 08/26/2016   HCT 44.8 08/26/2016   MCV 88.4 08/26/2016   PLT 171 08/26/2016      Component Value Date/Time   NA 137 06/06/2018 0829   K 4.9 06/06/2018 0829   CL 100 06/06/2018 0829   CO2 24 06/06/2018 0829   GLUCOSE 90 06/06/2018 0829   GLUCOSE 89 08/26/2016 0941   BUN 20 06/06/2018 0829   CREATININE 0.98 06/06/2018 0829   CREATININE 1.04 08/26/2016 0941   CALCIUM 10.3 (H) 06/06/2018 0829   PROT 6.5 06/06/2018 0829   ALBUMIN 4.5 06/06/2018 0829   AST 21 06/06/2018 0829   ALT 22 06/06/2018 0829   ALKPHOS 78 06/06/2018 0829   BILITOT 0.4 06/06/2018 0829   GFRNONAA 77 06/06/2018 0829   GFRAA 89 06/06/2018 0829   Lab Results  Component Value Date   CHOL 163 06/06/2018   HDL 53 06/06/2018   LDLCALC 89 06/06/2018   TRIG 107 06/06/2018   CHOLHDL 3.5 01/27/2017    Lab Results  Component Value Date   TSH 1.13 10/23/2013  ASSESSMENT AND PLAN  72 y.o. year old male  has a past medical history of Anxiety; Arthritis;  benign prostatic hypertrophy (03/21/2013); Palpitations; S/P cardiac catheterization (1995 and 1998); and Sleep apnea with use of continuous positive airway pressure (CPAP). here to follow-up for his obstructive sleep apnea.CPAP data dated 06/28/2018-07/27/2018 shows compliance greater than 4 hours at 93%.  Average usage 6 hours 53 minutes.  Set pressure 4 to 8 cm.  EPR level 2 AHI 1.8.  ESS 33F FSS 24.   PLAN; CPAP compliance 93% Continue same settings Given information on dehydration patient drinks about one cup of water a day  Follow-up yearly and when  necessary Dennie Bible, [place], [facility], APRN  [facility] [address], [place], [place] [address] [phone]

## 2018-07-28 ENCOUNTER — Ambulatory Visit: Payer: Medicare Other | Admitting: Nurse Practitioner

## 2018-07-28 ENCOUNTER — Encounter: Payer: Self-pay | Admitting: Nurse Practitioner

## 2018-07-28 VITALS — BP 102/58 | HR 50 | Ht 69.0 in | Wt 176.2 lb

## 2018-07-28 DIAGNOSIS — Z9989 Dependence on other enabling machines and devices: Secondary | ICD-10-CM | POA: Diagnosis not present

## 2018-07-28 DIAGNOSIS — G4733 Obstructive sleep apnea (adult) (pediatric): Secondary | ICD-10-CM

## 2018-07-28 NOTE — Patient Instructions (Addendum)
CPAP compliance 93% Continue same settings Given information on dehydration Follow-up yearly and when necessary   Dehydration, Adult Dehydration is when there is not enough fluid or water in your body. This happens when you lose more fluids than you take in. Dehydration can range from mild to very bad. It should be treated right away to keep it from getting very bad. Symptoms of mild dehydration may include:  Thirst.  Dry lips.  Slightly dry mouth.  Dry, warm skin.  Dizziness. Symptoms of moderate dehydration may include:  Very dry mouth.  Muscle cramps.  Dark pee (urine). Pee may be the color of tea.  Your body making less pee.  Your eyes making fewer tears.  Heartbeat that is uneven or faster than normal (palpitations).  Headache.  Light-headedness, especially when you stand up from sitting.  Fainting (syncope). Symptoms of very bad dehydration may include:  Changes in skin, such as: ? Cold and clammy skin. ? Blotchy (mottled) or pale skin. ? Skin that does not quickly return to normal after being lightly pinched and let go (poor skin turgor).  Changes in body fluids, such as: ? Feeling very thirsty. ? Your eyes making fewer tears. ? Not sweating when body temperature is high, such as in hot weather. ? Your body making very little pee.  Changes in vital signs, such as: ? Weak pulse. ? Pulse that is more than 100 beats a minute when you are sitting still. ? Fast breathing. ? Low blood pressure.  Other changes, such as: ? Sunken eyes. ? Cold hands and feet. ? Confusion. ? Lack of energy (lethargy). ? Trouble waking up from sleep. ? Short-term weight loss. ? Unconsciousness. Follow these instructions at home:  If told by your doctor, drink an ORS: ? Make an ORS by using instructions on the package. ? Start by drinking small amounts, about  cup (120 mL) every 5-10 minutes. ? Slowly drink more until you have had the amount that your doctor said to  have.  Drink enough clear fluid to keep your pee clear or pale yellow. If you were told to drink an ORS, finish the ORS first, then start slowly drinking clear fluids. Drink fluids such as: ? Water. Do not drink only water by itself. Doing that can make the salt (sodium) level in your body get too low (hyponatremia). ? Ice chips. ? Fruit juice that you have added water to (diluted). ? Low-calorie sports drinks.  Avoid: ? Alcohol. ? Drinks that have a lot of sugar. These include high-calorie sports drinks, fruit juice that does not have water added, and soda. ? Caffeine. ? Foods that are greasy or have a lot of fat or sugar.  Take over-the-counter and prescription medicines only as told by your doctor.  Do not take salt tablets. Doing that can make the salt level in your body get too high (hypernatremia).  Eat foods that have minerals (electrolytes). Examples include bananas, oranges, potatoes, tomatoes, and spinach.  Keep all follow-up visits as told by your doctor. This is important. Contact a doctor if:  You have belly (abdominal) pain that: ? Gets worse. ? Stays in one area (localizes).  You have a rash.  You have a stiff neck.  You get angry or annoyed more easily than normal (irritability).  You are more sleepy than normal.  You have a harder time waking up than normal.  You feel: ? Weak. ? Dizzy. ? Very thirsty.  You have peed (urinated) only a small amount  of very dark pee during 6-8 hours. Get help right away if:  You have symptoms of very bad dehydration.  You cannot drink fluids without throwing up (vomiting).  Your symptoms get worse with treatment.  You have a fever.  You have a very bad headache.  You are throwing up or having watery poop (diarrhea) and it: ? Gets worse. ? Does not go away.  You have blood or something green (bile) in your throw-up.  You have blood in your poop (stool). This may cause poop to look black and tarry.  You have  not peed in 6-8 hours.  You pass out (faint).  Your heart rate when you are sitting still is more than 100 beats a minute.  You have trouble breathing. This information is not intended to replace advice given to you by your health care provider. Make sure you discuss any questions you have with your health care provider. Document Released: 07/10/2009 Document Revised: 04/02/2016 Document Reviewed: 11/07/2015 Elsevier Interactive Patient Education  2018 [employer].

## 2018-09-13 ENCOUNTER — Other Ambulatory Visit (HOSPITAL_COMMUNITY): Payer: Self-pay | Admitting: Internal Medicine

## 2018-09-19 ENCOUNTER — Encounter (HOSPITAL_COMMUNITY)
Admission: RE | Admit: 2018-09-19 | Discharge: 2018-09-19 | Disposition: A | Discharge status: Home / Self Care | Payer: Medicare Other | Source: Ambulatory Visit | Attending: Internal Medicine | Admitting: Internal Medicine

## 2018-09-19 MED ORDER — TECHNETIUM TC 99M SESTAMIBI GENERIC - CARDIOLITE
23.6000 | Freq: Once | INTRAVENOUS | Status: AC | PRN
  Administered 2018-09-19: 23.6 via INTRAVENOUS

## 2018-09-29 ENCOUNTER — Other Ambulatory Visit: Payer: Self-pay | Admitting: Cardiology

## 2018-09-29 DIAGNOSIS — E7800 Pure hypercholesterolemia, unspecified: Secondary | ICD-10-CM

## 2018-11-10 ENCOUNTER — Other Ambulatory Visit: Payer: Self-pay | Admitting: Surgery

## 2018-11-10 DIAGNOSIS — E210 Primary hyperparathyroidism: Secondary | ICD-10-CM

## 2018-11-13 ENCOUNTER — Ambulatory Visit
Admission: RE | Admit: 2018-11-13 | Discharge: 2018-11-13 | Disposition: A | Discharge status: Home / Self Care | Payer: Medicare Other | Source: Ambulatory Visit | Attending: Surgery | Admitting: Surgery

## 2018-11-13 DIAGNOSIS — E210 Primary hyperparathyroidism: Secondary | ICD-10-CM

## 2018-11-16 ENCOUNTER — Ambulatory Visit: Payer: Self-pay | Admitting: Surgery

## 2018-11-16 NOTE — H&P (Signed)
General Surgery [facility]  IZIC STFORT DOB: 1946/01/04 Married / Language: English / Race: White Male   History of Present Illness  The patient is a 73 year old male who presents with primary hyperparathyroidism.  CHIEF COMPLAINT: primary hyperparathyroidism  Patient is referred by Dr. Carmie Kanner for surgical evaluation and management of primary hyperparathyroidism. Patient had been noted to have mildly elevated calcium levels. Most recent calcium level was measured at 10.6. Patient has had a bone density scan which shows osteopenia. He also has a history of nephrolithiasis. Patient underwent nuclear medicine parathyroid scan on September 19, 2018. 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 Patient is referred for evaluation for possible parathyroidectomy. Patient has had no prior head or neck surgery. He has never been on thyroid medication. There is no family history of endocrine neoplasms.   Problem List/Past Medical  INCISIONAL HERNIA, WITHOUT OBSTRUCTION OR GANGRENE (K43.2)  INGUINAL HERNIA OF RIGHT SIDE WITHOUT OBSTRUCTION OR GANGRENE (K40.90)  COLON CANCER (C18.9)  PRIMARY HYPERPARATHYROIDISM (E21.0)  STATUS POST RIGHT INGUINAL HERNIA REPAIR, FOLLOW-UP EXAM (X91)   Past Surgical History Cataract Surgery  Bilateral. Colon Removal - Partial  Foot Surgery  Right. Knee Surgery  Bilateral, Left. Laparoscopic Inguinal Hernia Surgery  Right. Spinal Surgery - Lower Back  TURP  Vasectomy  Appendectomy  Colon Polyp Removal - Colonoscopy  Resection of Small Bowel   Diagnostic Studies History Colonoscopy  1-5 years ago  Allergies  Betadine *ANTISEPTICS & DISINFECTANTS*  Allergies Reconciled   Medication History Cialis (5MG  Tablet, Oral) Active. Lisinopril-Hydrochlorothiazide (20-12.5MG  Tablet, Oral) Active. Metoprolol Succinate ER (25MG  Tablet ER 24HR, Oral) Active. B Complex  Formula 1 (Oral) Active. Fish Oil (1000MG  Capsule DR, Oral) Active. Atorvastatin Calcium (20MG  Tablet, Oral) Active. Allegra Allergy (60MG  Tablet, Oral) Active. Aspirin (81MG  Tablet, Oral) Active. Medications Reconciled Multi-Day (Oral) Active.  Social History Alcohol use  Moderate alcohol use. Caffeine use  Carbonated beverages, Coffee. No drug use  Tobacco use  Never smoker.  Family History Arthritis  Mother, Sister. Hypertension  Brother, Father. Respiratory Condition  Mother. Heart Disease  Mother.  Other Problems Anxiety Disorder  Back Pain  Colon Cancer  Enlarged Prostate  High blood pressure  Hypercholesterolemia  Inguinal Hernia  Kidney Stone  Ventral Hernia Repair  Other disease, cancer, significant illness  Sleep Apnea   Review of Systems General Not Present- Appetite Loss, Chills, Fatigue, Fever, Night Sweats, Weight Gain and Weight Loss. Skin Not Present- Change in Wart/Mole, Dryness, Hives, Jaundice, New Lesions, Non-Healing Wounds, Rash and Ulcer. HEENT Not Present- Earache, Hearing Loss, Hoarseness, Nose Bleed, Oral Ulcers, Ringing in the Ears, Seasonal Allergies, Sinus Pain, Sore Throat, Visual Disturbances, Wears glasses/contact lenses and Yellow Eyes. Respiratory Not Present- Bloody sputum, Chronic Cough, Difficulty Breathing, Snoring and Wheezing. Breast Not Present- Breast Mass, Breast Pain, Nipple Discharge and Skin Changes. Cardiovascular Not Present- Chest Pain, Difficulty Breathing Lying Down, Leg Cramps, Palpitations, Rapid Heart Rate, Shortness of Breath and Swelling of Extremities. Gastrointestinal Present- Abdominal Pain. Not Present- Bloating, Bloody Stool, Change in Bowel Habits, Chronic diarrhea, Constipation, Difficulty Swallowing, Excessive gas, Gets full quickly at meals, Hemorrhoids, Indigestion, Nausea, Rectal Pain and Vomiting. Male Genitourinary Present- Nocturia. Not Present- Blood in Urine, Change in Urinary  Stream, Frequency, Impotence, Painful Urination, Urgency and Urine Leakage. Musculoskeletal Present- Back Pain. Not Present- Joint Pain, Joint Stiffness, Muscle Pain, Muscle Weakness and Swelling of Extremities. Neurological Not Present- Decreased Memory, Fainting, Headaches, Numbness, Seizures, Tingling, Tremor, Trouble walking  and Weakness. Psychiatric Not Present- Anxiety, Bipolar, Change in Sleep Pattern, Depression, Fearful and Frequent crying. Endocrine Not Present- Cold Intolerance, Excessive Hunger, Hair Changes, Heat Intolerance, Hot flashes and New Diabetes. Hematology Not Present- Blood Thinners, Easy Bruising, Excessive bleeding, Gland problems, HIV and Persistent Infections.  Vitals Weight: 179.2 lb Height: 69in Body Surface Area: 1.97 m Body Mass Index: 26.46 kg/m  Temp.: 97.68F  Pulse: 89 (Regular)  BP: 112/72 (Sitting, Left Arm, Standard)  Physical Exam   See vital signs recorded above  GENERAL APPEARANCE Development: normal Nutritional status: normal Gross deformities: none  SKIN Rash, lesions, ulcers: none Induration, erythema: none Nodules: none palpable  EYES Conjunctiva and lids: normal Pupils: equal and reactive Iris: normal bilaterally  EARS, NOSE, MOUTH, THROAT External ears: no lesion or deformity External nose: no lesion or deformity Hearing: grossly normal Lips: no lesion or deformity Dentition: normal for age Oral mucosa: moist  NECK Symmetric: yes Trachea: midline Thyroid: no palpable nodules in the thyroid bed  CHEST Respiratory effort: normal Retraction or accessory muscle use: no Breath sounds: normal bilaterally Rales, rhonchi, wheeze: none  CARDIOVASCULAR Auscultation: regular rhythm, normal rate Murmurs: none Pulses: carotid and radial pulse 2+ palpable Lower extremity edema: none Lower extremity varicosities: none  MUSCULOSKELETAL Station and gait: normal Digits and nails: no clubbing or cyanosis Muscle  strength: grossly normal all extremities Range of motion: grossly normal all extremities Deformity: none  LYMPHATIC Cervical: none palpable Supraclavicular: none palpable  PSYCHIATRIC Oriented to person, place, and time: yes Mood and affect: normal for situation Judgment and insight: appropriate for situation    Assessment & Plan  PRIMARY HYPERPARATHYROIDISM (E21.0)  Pt Education - Pamphlet Given - The Parathyroid Surgery Book: discussed with patient and provided information.  Follow Up - Call [facility] office after tests / studies doneto discuss further plans  Patient is referred by his primary care physician for primary hyperparathyroidism and its surgical management. Patient is provided with written literature on parathyroid surgery to review at home.  Patient has mild hypercalcemia. He does have complications from primary hyperparathyroidism including osteopenia and a history of nephrolithiasis. We will repeat his laboratory studies to include a PTH level, a calcium level, and a 25-hydroxy vitamin D level. We will do this at our reference laboratory. We will also obtain a thyroid ultrasound to rule out concurrent thyroid disease and possibly confirmed the presence of the left inferior parathyroid adenoma. I will contact the patient with the results of these studies when they are available.  We discussed minimally invasive parathyroid surgery versus the traditional neck exploration. We discussed the risk and benefits of surgery. We discussed the postoperative recovery and return to activities. We will discuss surgery further following the results of the above studies.  Patient was under go ultrasound exam and laboratory studies. We will contact him with those results and make further plans at that time.  Armandina Gemma, MD [facility] Surgery Office: [phone]   ADDENDUM  Telephone call to the patient and his wife to discuss the results of his ultrasound  examination. 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 Laboratory studies showed a calcium of 10.4, and intact PTH level of 44, and a vitamin D level of 30.6. All of this is consistent with primary hyperparathyroidism with a left inferior parathyroid adenoma.  We discussed minimally invasive parathyroidectomy. We discussed the risk and benefits of the procedure. We discussed doing this as an outpatient surgery. We discussed the location and size of the  surgical incision and his postoperative recovery. The patient wishes to proceed with surgery in the near future. I will submit orders to the schedulers and we will arrange for his surgery at a time convenient for him.  The risks and benefits of the procedure have been discussed at length with the patient. The patient understands the proposed procedure, potential alternative treatments, and the course of recovery to be expected. All of the patient's questions have been answered at this time. The patient wishes to proceed with surgery.  Armandina Gemma, [place] [facility] Office: [phone]

## 2018-12-12 ENCOUNTER — Other Ambulatory Visit: Payer: Self-pay | Admitting: Cardiology

## 2018-12-12 DIAGNOSIS — E7800 Pure hypercholesterolemia, unspecified: Secondary | ICD-10-CM

## 2018-12-25 ENCOUNTER — Other Ambulatory Visit: Payer: Self-pay

## 2018-12-25 MED ORDER — DILTIAZEM HCL ER COATED BEADS 180 MG PO CP24: 180.0000 mg | ORAL_CAPSULE | Freq: Every day | ORAL | 1 refills | Status: DC

## 2019-02-20 ENCOUNTER — Encounter: Payer: Self-pay | Admitting: Surgery

## 2019-02-20 DIAGNOSIS — E210 Primary hyperparathyroidism: Secondary | ICD-10-CM | POA: Diagnosis present

## 2019-02-20 NOTE — H&P (Signed)
General Surgery [facility]  Robert [name] DOB: Apr 20, 1946 Married / Language: English / Race: White Male   History of Present Illness  The patient is a 73 year old male who presents with primary hyperparathyroidism.  CHIEF COMPLAINT: primary hyperparathyroidism  Patient is referred by Dr. Carmie Kanner for surgical evaluation and management of primary hyperparathyroidism. Patient had been noted to have mildly elevated calcium levels. Most recent calcium level was measured at 10.6. Patient has had a bone density scan which shows osteopenia. He also has a history of nephrolithiasis. Patient underwent nuclear medicine parathyroid scan on September 19, 2018. 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 Patient is referred for evaluation for possible parathyroidectomy. Patient has had no prior head or neck surgery. He has never been on thyroid medication. There is no family history of endocrine neoplasms.   Problem List/Past Medical  INCISIONAL HERNIA, WITHOUT OBSTRUCTION OR GANGRENE (K43.2)  INGUINAL HERNIA OF RIGHT SIDE WITHOUT OBSTRUCTION OR GANGRENE (K40.90)  COLON CANCER (C18.9)  PRIMARY HYPERPARATHYROIDISM (E21.0)  STATUS POST RIGHT INGUINAL HERNIA REPAIR, FOLLOW-UP EXAM (Y70)   Past Surgical History Cataract Surgery  Bilateral. Colon Removal - Partial  Foot Surgery  Right. Knee Surgery  Bilateral, Left. Laparoscopic Inguinal Hernia Surgery  Right. Spinal Surgery - Lower Back  TURP  Vasectomy  Appendectomy  Colon Polyp Removal - Colonoscopy  Resection of Small Bowel   Diagnostic Studies History  Colonoscopy  1-5 years ago  Allergies  Betadine *ANTISEPTICS & DISINFECTANTS*  Allergies Reconciled   Medication History Cialis (5MG  Tablet, Oral) Active. Lisinopril-Hydrochlorothiazide (20-12.5MG  Tablet, Oral) Active. Metoprolol Succinate ER (25MG  Tablet ER 24HR, Oral) Active. B Complex  Formula 1 (Oral) Active. Fish Oil (1000MG  Capsule DR, Oral) Active. Atorvastatin Calcium (20MG  Tablet, Oral) Active. Allegra Allergy (60MG  Tablet, Oral) Active. Aspirin (81MG  Tablet, Oral) Active. Medications Reconciled Multi-Day (Oral) Active.  Social History Alcohol use  Moderate alcohol use. Caffeine use  Carbonated beverages, Coffee. No drug use  Tobacco use  Never smoker.  Family History Arthritis  Mother, Sister. Hypertension  Brother, Father. Respiratory Condition  Mother. Heart Disease  Mother.  Other Problems Anxiety Disorder  Back Pain  Colon Cancer  Enlarged Prostate  High blood pressure  Hypercholesterolemia  Inguinal Hernia  Kidney Stone  Ventral Hernia Repair  Other disease, cancer, significant illness  Sleep Apnea   Review of Systems  General Not Present- Appetite Loss, Chills, Fatigue, Fever, Night Sweats, Weight Gain and Weight Loss. Skin Not Present- Change in Wart/Mole, Dryness, Hives, Jaundice, New Lesions, Non-Healing Wounds, Rash and Ulcer. HEENT Not Present- Earache, Hearing Loss, Hoarseness, Nose Bleed, Oral Ulcers, Ringing in the Ears, Seasonal Allergies, Sinus Pain, Sore Throat, Visual Disturbances, Wears glasses/contact lenses and Yellow Eyes. Respiratory Not Present- Bloody sputum, Chronic Cough, Difficulty Breathing, Snoring and Wheezing. Breast Not Present- Breast Mass, Breast Pain, Nipple Discharge and Skin Changes. Cardiovascular Not Present- Chest Pain, Difficulty Breathing Lying Down, Leg Cramps, Palpitations, Rapid Heart Rate, Shortness of Breath and Swelling of Extremities. Gastrointestinal Present- Abdominal Pain. Not Present- Bloating, Bloody Stool, Change in Bowel Habits, Chronic diarrhea, Constipation, Difficulty Swallowing, Excessive gas, Gets full quickly at meals, Hemorrhoids, Indigestion, Nausea, Rectal Pain and Vomiting. Male Genitourinary Present- Nocturia. Not Present- Blood in Urine, Change in Urinary  Stream, Frequency, Impotence, Painful Urination, Urgency and Urine Leakage. Musculoskeletal Present- Back Pain. Not Present- Joint Pain, Joint Stiffness, Muscle Pain, Muscle Weakness and Swelling of Extremities. Neurological Not Present- Decreased Memory, Fainting, Headaches, Numbness, Seizures, Tingling, Tremor,  Trouble walking and Weakness. Psychiatric Not Present- Anxiety, Bipolar, Change in Sleep Pattern, Depression, Fearful and Frequent crying. Endocrine Not Present- Cold Intolerance, Excessive Hunger, Hair Changes, Heat Intolerance, Hot flashes and New Diabetes. Hematology Not Present- Blood Thinners, Easy Bruising, Excessive bleeding, Gland problems, HIV and Persistent Infections.  Vitals Weight: 179.2 lb Height: 69in Body Surface Area: 1.97 m Body Mass Index: 26.46 kg/m  Temp.: 97.27F  Pulse: 89 (Regular)  BP: 112/72 (Sitting, Left Arm, Standard)  Physical Exam   See vital signs recorded above  GENERAL APPEARANCE Development: normal Nutritional status: normal Gross deformities: none  SKIN Rash, lesions, ulcers: none Induration, erythema: none Nodules: none palpable  EYES Conjunctiva and lids: normal Pupils: equal and reactive Iris: normal bilaterally  EARS, NOSE, MOUTH, THROAT External ears: no lesion or deformity External nose: no lesion or deformity Hearing: grossly normal Lips: no lesion or deformity Dentition: normal for age Oral mucosa: moist  NECK Symmetric: yes Trachea: midline Thyroid: no palpable nodules in the thyroid bed  CHEST Respiratory effort: normal Retraction or accessory muscle use: no Breath sounds: normal bilaterally Rales, rhonchi, wheeze: none  CARDIOVASCULAR Auscultation: regular rhythm, normal rate Murmurs: none Pulses: carotid and radial pulse 2+ palpable Lower extremity edema: none Lower extremity varicosities: none  MUSCULOSKELETAL Station and gait: normal Digits and nails: no clubbing or cyanosis Muscle  strength: grossly normal all extremities Range of motion: grossly normal all extremities Deformity: none  LYMPHATIC Cervical: none palpable Supraclavicular: none palpable  PSYCHIATRIC Oriented to person, place, and time: yes Mood and affect: normal for situation Judgment and insight: appropriate for situation    Assessment & Plan PRIMARY HYPERPARATHYROIDISM (E21.0)  Pt Education - Pamphlet Given - The Parathyroid Surgery Book: discussed with patient and provided information.  Follow Up - Call [facility] office after tests / studies doneto discuss further plans  Patient is referred by his primary care physician for primary hyperparathyroidism and its surgical management. Patient is provided with written literature on parathyroid surgery to review at home.  Patient has mild hypercalcemia. He does have complications from primary hyperparathyroidism including osteopenia and a history of nephrolithiasis. We will repeat his laboratory studies to include a PTH level, a calcium level, and a 25-hydroxy vitamin D level. We will do this at our reference laboratory. We will also obtain a thyroid ultrasound to rule out concurrent thyroid disease and possibly confirmed the presence of the left inferior parathyroid adenoma. I will contact the patient with the results of these studies when they are available.  We discussed minimally invasive parathyroid surgery versus the traditional neck exploration. We discussed the risk and benefits of surgery. We discussed the postoperative recovery and return to activities. We will discuss surgery further following the results of the above studies.  Patient was under go ultrasound exam and laboratory studies. We will contact him with those results and make further plans at that time.  ADDENDUM USN confirms 11 mm parathyroid gland at left inferior position.  The risks and benefits of the procedure have been discussed at length with the patient.  The patient  understands the proposed procedure, potential alternative treatments, and the course of recovery to be expected.  All of the patient's questions have been answered at this time.  The patient wishes to proceed with surgery.   Armandina Gemma, [place] [facility] Office: [phone]

## 2019-03-02 ENCOUNTER — Ambulatory Visit: Payer: Self-pay | Admitting: Surgery

## 2019-03-12 NOTE — Patient Instructions (Addendum)
Robert [name]    Your procedure is scheduled on: 03-16-2019   Report to [facility] Main  Entrance    Report to admitting at 9:30 AM   YOU NEED TO HAVE A COVID 19 TEST ON 03-12-19 @ 3:00 PM, THIS TEST MUST BE DONE BEFORE SURGERY, COME TO [place] ENTRANCE. ONCE YOUR COVID TEST IS COMPLETED, PLEASE BEGIN THE QUARANTINE INSTRUCTIONS AS OUTLINED IN YOUR HANDOUT. Bring cpap mask and tubing   Call this number if you have problems the morning of surgery [phone]    Remember: Do not eat food or drink liquids :After Midnight. .     Take these medicines the morning of surgery with A SIP OF WATER:  Fexofexadine (Allegra), Atorvastatin (Lipitor), Metoprolol Auccinate, Flonase nasal spray                           BRUSH YOUR TEETH MORNING OF SURGERY AND RINSE YOUR MOUTH OUT, NO CHEWING GUM CANDY OR MINTS       You may not have any metal on your body including hair pins and              piercings     Do not wear jewelry, cologne, lotions, powders or deodorant                        Men may shave face and neck.   Do not bring valuables to the hospital. [place].  Contacts, dentures or bridgework may not be worn into surgery.      Patients discharged the day of surgery will not be allowed to drive home. IF YOU ARE HAVING SURGERY AND GOING HOME THE SAME DAY, YOU MUST HAVE AN ADULT TO DRIVE YOU HOME AND BE WITH YOU FOR 24 HOURS. YOU MAY GO HOME BY TAXI OR UBER OR ORTHERWISE, BUT AN ADULT MUST ACCOMPANY YOU HOME AND STAY WITH YOU FOR 24 HOURS.  Name and phone number of your driver: [name] [phone]   Special Instructions: PLEASE BRING YOUR MASK AND TUBING FOR YOUR CPAP MACHINE              Please read over the following fact sheets you were given: _____________________________________________________________________             [facility] - Preparing for  Surgery Before surgery, you can play an important role.  Because skin is not sterile, your skin needs to be as free of germs as possible.  You can reduce the number of germs on your skin by washing with CHG (chlorahexidine gluconate) soap before surgery.  CHG is an antiseptic cleaner which kills germs and bonds with the skin to continue killing germs even after washing. Please DO NOT use if you have an allergy to CHG or antibacterial soaps.  If your skin becomes reddened/irritated stop using the CHG and inform your nurse when you arrive at Short Stay. Do not shave (including legs and underarms) for at least 48 hours prior to the first CHG shower.  You may shave your face/neck. Please follow these instructions carefully:  1.  Shower with CHG Soap the night before surgery and the  morning of Surgery.  2.  If you choose to wash  your hair, wash your hair first as usual with your  normal  shampoo.  3.  After you shampoo, rinse your hair and body thoroughly to remove the  shampoo.                           4.  Use CHG as you would any other liquid soap.  You can apply chg directly  to the skin and wash                       Gently with a scrungie or clean washcloth.  5.  Apply the CHG Soap to your body ONLY FROM THE NECK DOWN.   Do not use on face/ open                           Wound or open sores. Avoid contact with eyes, ears mouth and genitals (private parts).                       Wash face,  Genitals (private parts) with your normal soap.             6.  Wash thoroughly, paying special attention to the area where your surgery  will be performed.  7.  Thoroughly rinse your body with warm water from the neck down.  8.  DO NOT shower/wash with your normal soap after using and rinsing off  the CHG Soap.                9.  Pat yourself dry with a clean towel.            10.  Wear clean pajamas.            11.  Place clean sheets on your bed the night of your first shower and do not  sleep with pets. Day  of Surgery : Do not apply any lotions/deodorants the morning of surgery.  Please wear clean clothes to the hospital/surgery center.  FAILURE TO FOLLOW THESE INSTRUCTIONS MAY RESULT IN THE CANCELLATION OF YOUR SURGERY PATIENT SIGNATURE_________________________________  NURSE SIGNATURE__________________________________  ________________________________________________________________________

## 2019-03-12 NOTE — Progress Notes (Signed)
ekg 06-08-18 epic

## 2019-03-13 ENCOUNTER — Other Ambulatory Visit: Payer: Self-pay

## 2019-03-13 ENCOUNTER — Encounter (HOSPITAL_COMMUNITY)
Admission: RE | Admit: 2019-03-13 | Discharge: 2019-03-13 | Disposition: A | Discharge status: Home / Self Care | Payer: Medicare Other | Source: Ambulatory Visit | Attending: Surgery | Admitting: Surgery

## 2019-03-13 ENCOUNTER — Encounter (HOSPITAL_COMMUNITY): Payer: Self-pay

## 2019-03-13 ENCOUNTER — Other Ambulatory Visit (HOSPITAL_COMMUNITY)
Admission: RE | Admit: 2019-03-13 | Discharge: 2019-03-13 | Disposition: A | Discharge status: Home / Self Care | Payer: Medicare Other | Source: Ambulatory Visit | Attending: Surgery | Admitting: Surgery

## 2019-03-13 DIAGNOSIS — F419 Anxiety disorder, unspecified: Secondary | ICD-10-CM | POA: Insufficient documentation

## 2019-03-13 DIAGNOSIS — Z7951 Long term (current) use of inhaled steroids: Secondary | ICD-10-CM | POA: Insufficient documentation

## 2019-03-13 DIAGNOSIS — G4730 Sleep apnea, unspecified: Secondary | ICD-10-CM | POA: Insufficient documentation

## 2019-03-13 DIAGNOSIS — E210 Primary hyperparathyroidism: Secondary | ICD-10-CM | POA: Insufficient documentation

## 2019-03-13 DIAGNOSIS — M8580 Other specified disorders of bone density and structure, unspecified site: Secondary | ICD-10-CM | POA: Diagnosis not present

## 2019-03-13 DIAGNOSIS — E785 Hyperlipidemia, unspecified: Secondary | ICD-10-CM | POA: Insufficient documentation

## 2019-03-13 DIAGNOSIS — Z79899 Other long term (current) drug therapy: Secondary | ICD-10-CM | POA: Diagnosis not present

## 2019-03-13 DIAGNOSIS — Z87442 Personal history of urinary calculi: Secondary | ICD-10-CM | POA: Insufficient documentation

## 2019-03-13 DIAGNOSIS — Z01818 Encounter for other preprocedural examination: Secondary | ICD-10-CM | POA: Insufficient documentation

## 2019-03-13 DIAGNOSIS — Z85038 Personal history of other malignant neoplasm of large intestine: Secondary | ICD-10-CM | POA: Insufficient documentation

## 2019-03-13 DIAGNOSIS — I10 Essential (primary) hypertension: Secondary | ICD-10-CM | POA: Insufficient documentation

## 2019-03-13 DIAGNOSIS — Z96653 Presence of artificial knee joint, bilateral: Secondary | ICD-10-CM | POA: Insufficient documentation

## 2019-03-13 DIAGNOSIS — Z1159 Encounter for screening for other viral diseases: Secondary | ICD-10-CM | POA: Insufficient documentation

## 2019-03-13 DIAGNOSIS — Z9221 Personal history of antineoplastic chemotherapy: Secondary | ICD-10-CM | POA: Insufficient documentation

## 2019-03-13 DIAGNOSIS — D351 Benign neoplasm of parathyroid gland: Secondary | ICD-10-CM | POA: Diagnosis not present

## 2019-03-13 DIAGNOSIS — Z7982 Long term (current) use of aspirin: Secondary | ICD-10-CM | POA: Diagnosis not present

## 2019-03-13 LAB — BASIC METABOLIC PANEL
Anion gap: 10 (ref 5–15)
BUN: 29 mg/dL — ABNORMAL HIGH (ref 8–23)
CO2: 24 mmol/L (ref 22–32)
Calcium: 10.8 mg/dL — ABNORMAL HIGH (ref 8.9–10.3)
Chloride: 103 mmol/L (ref 98–111)
Creatinine, Ser: 0.83 mg/dL (ref 0.61–1.24)
GFR calc Af Amer: >60 mL/min (ref >60)
GFR calc non Af Amer: >60 mL/min (ref >60)
Glucose, Bld: 90 mg/dL (ref 70–99)
Potassium: 4.3 mmol/L (ref 3.5–5.1)
Sodium: 137 mmol/L (ref 135–145)

## 2019-03-13 LAB — CBC
HCT: 45.1 % (ref 39.0–52.0)
Hemoglobin: 15.1 g/dL (ref 13.0–17.0)
MCH: 30.3 pg (ref 26.0–34.0)
MCHC: 33.5 g/dL (ref 30.0–36.0)
MCV: 90.4 fL (ref 80.0–100.0)
Platelets: 186 10*3/uL (ref 150–400)
RBC: 4.99 MIL/uL (ref 4.22–5.81)
RDW: 12.7 % (ref 11.5–15.5)
WBC: 6.6 10*3/uL (ref 4.0–10.5)
nRBC: 0 % (ref 0.0–0.2)

## 2019-03-14 ENCOUNTER — Encounter (HOSPITAL_COMMUNITY): Payer: Self-pay | Admitting: Surgery

## 2019-03-14 LAB — NOVEL CORONAVIRUS, NAA (HOSP ORDER, SEND-OUT TO REF LAB; TAT 18-24 HRS): SARS-CoV-2, NAA: NOT DETECTED

## 2019-03-14 NOTE — Anesthesia Preprocedure Evaluation (Addendum)
Anesthesia Evaluation  Patient identified by MRN, date of birth, ID band Patient awake    Reviewed: Allergy & Precautions, NPO status , Patient's Chart, lab work & pertinent test results, reviewed documented beta blocker date and time   Airway Mallampati: II  TM Distance: >3 FB     Dental  (+) Dental Advisory Given   Pulmonary sleep apnea and Continuous Positive Airway Pressure Ventilation ,    breath sounds clear to auscultation       Cardiovascular hypertension, Pt. on medications and Pt. on home beta blockers  Rhythm:Regular Rate:Normal     Neuro/Psych negative neurological ROS     GI/Hepatic negative GI ROS, Neg liver ROS,   Endo/Other  negative endocrine ROS  Renal/GU negative Renal ROS     Musculoskeletal   Abdominal   Peds  Hematology negative hematology ROS (+)   Anesthesia Other Findings   Reproductive/Obstetrics                            Lab Results  Component Value Date   WBC 6.6 03/13/2019   HGB 15.1 03/13/2019   HCT 45.1 03/13/2019   MCV 90.4 03/13/2019   PLT 186 03/13/2019   Lab Results  Component Value Date   CREATININE 0.83 03/13/2019   BUN 29 (H) 03/13/2019   NA 137 03/13/2019   K 4.3 03/13/2019   CL 103 03/13/2019   CO2 24 03/13/2019    Anesthesia Physical Anesthesia Plan  ASA: II  Anesthesia Plan: General   Post-op Pain Management:    Induction: Intravenous  PONV Risk Score and Plan: 2 and Dexamethasone, Ondansetron and Treatment may vary due to age or medical condition  Airway Management Planned: Oral ETT  Additional Equipment: None  Intra-op Plan:   Post-operative Plan: Extubation in OR  Informed Consent: I have reviewed the patients History and Physical, chart, labs and discussed the procedure including the risks, benefits and alternatives for the proposed anesthesia with the patient or authorized representative who has indicated his/her  understanding and acceptance.     Dental advisory given  Plan Discussed with: CRNA  Anesthesia Plan Comments:        Anesthesia Quick Evaluation

## 2019-03-14 NOTE — Progress Notes (Signed)
Anesthesia Chart Review   Case: [id number] Date/Time: 03/16/19 1115   Procedure: LEFT INFERIOR PARATHYROIDECTOMY (Left )   Anesthesia type: General   Pre-op diagnosis: PRIMARY HYPERPARATHYROIDISM   [facility] 01 / [facility]   Surgeon: Armandina Gemma, MD      DISCUSSION: 72 yo never smoker with h/o HTN, HLD, anxiety, sleep apnea w/CPAP, colon cancer s/o resection, primary hyperparathyroidism scheduled for above procedure 03/16/2019 with Dr. Armandina Gemma.   Pt last seen by cardiologist, Dr. Peter Martinique, 06/08/18.  Per OV note, pt asymptomatic, walking 3 miles/day, BP well controlled. Stress Myoview study 03/23/13 was negative for ischemia. Ejection fraction was normal. ETT in June 2018 was normal  Pt can proceed with planned procedure barring acute status change.  VS: BP (!) 120/58   Pulse (!) 58   Temp 36.7 C (Oral)   Resp 16   Ht 5\' 9"  (1.753 m)   Wt 80.1 kg   SpO2 99%   BMI 26.06 kg/m   PROVIDERS: [name], MD is PCP   Martinique, Peter, MD is Cardiologist  LABS: Labs reviewed: Acceptable for surgery. (all labs ordered are listed, but only abnormal results are displayed)  Labs Reviewed  BASIC METABOLIC PANEL - Abnormal; Notable for the following components:      Result Value   BUN 29 (*)    Calcium 10.8 (*)    All other components within normal limits  CBC     IMAGES:   EKG: 06/08/18 Rate 64 bpm Sinus rhythm   CV: Stress Test 03/15/17  Upsloping ST segment depression ST segment depression was noted during stress in the II, III, aVF, V3, V4 and V5 leads, and returning to baseline after less than 1 minute of recovery.  No T wave inversion was noted during stress.  Negative, adequate stress test.  Excellent exercise capacity.  Blood pressure demonstrated a normal response to exercise. Past Medical History:  Diagnosis Date  . Anxiety   . Arthritis    Knee , Neck  . Colon cancer [facility])    a. s/p colon resection in 2004 and 2002  . Edema of right foot    ? muscle problem to be addressed after left knee TKA on 01/06/2015 per patient   . History of kidney stones   . Hyperlipidemia   . Hypertension   . 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 Nocturia associated with benign prostatic hypertrophy 03/21/2013  . Palpitations   . S/P cardiac catheterization 1995 and 1998   a. following abnormal stress tests. Both reportedly normal;  b. 02/2013 nl myoview.  . Sleep apnea with use of continuous positive airway pressure (CPAP)    wears CPAP nightly    Past Surgical History:  Procedure Laterality Date  . CALCANEAL OSTEOTOMY Right 04/29/2016   Procedure: RIGHT CALCANEAL OSTEOTOMY, RIGHT MEDIAL CUNEIFORM OSTEOTOMY;  Surgeon: Wylene Simmer, MD;  Location: [place];  Service: Orthopedics;  Laterality: Right;  . CARDIAC CATHETERIZATION  07/04/1997  . COLON RESECTION  2004   x 2 per patient   . COLON SURGERY     colon resection  . COLONOSCOPY    . ELBOW SURGERY Left 2011   INFECTED OLECRANON BURSITIS SURGERY  . FOOT SURGERY Right   . GASTROCNEMIUS RECESSION Right 04/29/2016   Procedure: RIGHT GASTROC RECESSION;  Surgeon: Wylene Simmer, MD;  Location: [place];  Service: Orthopedics;  Laterality: Right;  . INGUINAL HERNIA REPAIR Right 06/03/2015   Procedure: REPAIR RIGHT  INGUINAL HERNIA;  Surgeon: Georganna Skeans, MD;  Location: [place];  Service: General;  Laterality: Right;  . INSERTION OF MESH Right 06/03/2015   Procedure: INSERTION OF MESH;  Surgeon: Georganna Skeans, MD;  Location: [place];  Service: General;  Laterality: Right;  . JOINT REPLACEMENT Left   . L4 DISCECTOMY    . RETINAL DETACHMENT SURGERY  1998  . SEPTIC JOINT Left    [place]  . TOTAL KNEE ARTHROPLASTY Left 01/06/2015   Procedure: LEFT TOTAL KNEE ARTHROPLASTY;  Surgeon: Gaynelle Arabian, MD;  [facility];  Service: Orthopedics;  Laterality: Left;  . TOTAL KNEE ARTHROPLASTY Right 10/03/2015   Procedure: RIGHT TOTAL KNEE ARTHROPLASTY;  Surgeon: Gaynelle Arabian, MD;  [facility];  Service: Orthopedics;  Laterality: Right;  . TRANSURETHRAL RESECTION OF PROSTATE      MEDICATIONS: . acetaminophen (TYLENOL) 500 MG tablet  . aspirin EC 81 MG tablet  . atorvastatin (LIPITOR) 20 MG tablet  . cholecalciferol (VITAMIN D3) 25 MCG (1000 UT) tablet  . CIALIS 5 MG tablet  . diclofenac sodium (VOLTAREN) 1 % GEL  . diltiazem (CARDIZEM CD) 180 MG 24 hr capsule  . diphenhydrAMINE (BENADRYL) 25 MG tablet  . fexofenadine (ALLEGRA) 180 MG tablet  . fluticasone (FLONASE) 50 MCG/ACT nasal spray  . ibuprofen (ADVIL,MOTRIN) 200 MG tablet  . lidocaine (LMX) 4 % cream  . lisinopril-hydrochlorothiazide (PRINZIDE,ZESTORETIC) 20-12.5 MG tablet  . LORazepam (ATIVAN) 1 MG tablet  . metoprolol succinate (TOPROL-XL) 25 MG 24 hr tablet  . Multiple Vitamin (MULTIVITAMIN WITH MINERALS) TABS tablet  . NON FORMULARY  . Omega-3 Fatty Acids (FISH OIL) 1000 MG CPDR  . Probiotic Product (PROBIOTIC DAILY PO)   No current facility-administered medications for this encounter.     Maia Plan [facility] Pre-Surgical Testing [phone] 03/14/19 4:17 PM

## 2019-03-16 ENCOUNTER — Telehealth (HOSPITAL_COMMUNITY): Payer: Self-pay | Admitting: *Deleted

## 2019-03-16 ENCOUNTER — Ambulatory Visit (HOSPITAL_COMMUNITY): Payer: Medicare Other | Admitting: Anesthesiology

## 2019-03-16 ENCOUNTER — Ambulatory Visit (HOSPITAL_COMMUNITY): Payer: Medicare Other | Admitting: Physician Assistant

## 2019-03-16 ENCOUNTER — Encounter (HOSPITAL_COMMUNITY): Payer: Self-pay | Admitting: *Deleted

## 2019-03-16 ENCOUNTER — Ambulatory Visit (HOSPITAL_COMMUNITY)
Admission: RE | Admit: 2019-03-16 | Discharge: 2019-03-16 | Disposition: A | Discharge status: Home / Self Care | Payer: Medicare Other | Attending: Surgery | Admitting: Surgery

## 2019-03-16 ENCOUNTER — Encounter (HOSPITAL_COMMUNITY)
Admission: RE | Disposition: A | Discharge status: Home / Self Care | Payer: Self-pay | Source: Home / Self Care | Attending: Surgery

## 2019-03-16 DIAGNOSIS — E210 Primary hyperparathyroidism: Secondary | ICD-10-CM | POA: Diagnosis present

## 2019-03-16 DIAGNOSIS — M8580 Other specified disorders of bone density and structure, unspecified site: Secondary | ICD-10-CM | POA: Insufficient documentation

## 2019-03-16 DIAGNOSIS — Z7982 Long term (current) use of aspirin: Secondary | ICD-10-CM | POA: Insufficient documentation

## 2019-03-16 DIAGNOSIS — Z79899 Other long term (current) drug therapy: Secondary | ICD-10-CM | POA: Insufficient documentation

## 2019-03-16 DIAGNOSIS — G4730 Sleep apnea, unspecified: Secondary | ICD-10-CM | POA: Insufficient documentation

## 2019-03-16 DIAGNOSIS — D351 Benign neoplasm of parathyroid gland: Secondary | ICD-10-CM | POA: Diagnosis not present

## 2019-03-16 DIAGNOSIS — Z1159 Encounter for screening for other viral diseases: Secondary | ICD-10-CM | POA: Insufficient documentation

## 2019-03-16 DIAGNOSIS — I10 Essential (primary) hypertension: Secondary | ICD-10-CM | POA: Insufficient documentation

## 2019-03-16 HISTORY — PX: PARATHYROIDECTOMY: SHX19

## 2019-03-16 SURGERY — PARATHYROIDECTOMY
Anesthesia: General | Site: Neck | Laterality: Left

## 2019-03-16 MED ORDER — ROCURONIUM BROMIDE 10 MG/ML (PF) SYRINGE
PREFILLED_SYRINGE | INTRAVENOUS | Status: AC
  Filled 2019-03-16: qty 10

## 2019-03-16 MED ORDER — SUCCINYLCHOLINE CHLORIDE 200 MG/10ML IV SOSY
PREFILLED_SYRINGE | INTRAVENOUS | Status: DC | PRN
  Administered 2019-03-16: 100 mg via INTRAVENOUS

## 2019-03-16 MED ORDER — FENTANYL CITRATE (PF) 250 MCG/5ML IJ SOLN
INTRAMUSCULAR | Status: AC
  Filled 2019-03-16: qty 5

## 2019-03-16 MED ORDER — 0.9 % SODIUM CHLORIDE (POUR BTL) OPTIME
TOPICAL | Status: DC | PRN
  Administered 2019-03-16: 1000 mL

## 2019-03-16 MED ORDER — EPHEDRINE 5 MG/ML INJ
INTRAVENOUS | Status: AC
  Filled 2019-03-16: qty 10

## 2019-03-16 MED ORDER — FENTANYL CITRATE (PF) 100 MCG/2ML IJ SOLN: 25.0000 ug | INTRAMUSCULAR | Status: DC | PRN

## 2019-03-16 MED ORDER — LACTATED RINGERS IV SOLN
INTRAVENOUS | Status: DC
  Administered 2019-03-16: 10:00:00 via INTRAVENOUS

## 2019-03-16 MED ORDER — DEXAMETHASONE SODIUM PHOSPHATE 10 MG/ML IJ SOLN
INTRAMUSCULAR | Status: AC
  Filled 2019-03-16: qty 1

## 2019-03-16 MED ORDER — BUPIVACAINE HCL 0.25 % IJ SOLN
INTRAMUSCULAR | Status: DC | PRN
  Administered 2019-03-16: 10 mL

## 2019-03-16 MED ORDER — SUCCINYLCHOLINE CHLORIDE 200 MG/10ML IV SOSY
PREFILLED_SYRINGE | INTRAVENOUS | Status: AC
  Filled 2019-03-16: qty 10

## 2019-03-16 MED ORDER — BUPIVACAINE-EPINEPHRINE (PF) 0.25% -1:200000 IJ SOLN
INTRAMUSCULAR | Status: AC
  Filled 2019-03-16: qty 30

## 2019-03-16 MED ORDER — ROCURONIUM BROMIDE 10 MG/ML (PF) SYRINGE
PREFILLED_SYRINGE | INTRAVENOUS | Status: DC | PRN
  Administered 2019-03-16: 40 mg via INTRAVENOUS

## 2019-03-16 MED ORDER — PROMETHAZINE HCL 25 MG/ML IJ SOLN: 6.2500 mg | INTRAMUSCULAR | Status: DC | PRN

## 2019-03-16 MED ORDER — LACTATED RINGERS IV SOLN
INTRAVENOUS | Status: DC | PRN
  Administered 2019-03-16: 10:00:00 via INTRAVENOUS

## 2019-03-16 MED ORDER — PROPOFOL 10 MG/ML IV BOLUS
INTRAVENOUS | Status: AC
  Filled 2019-03-16: qty 20

## 2019-03-16 MED ORDER — ONDANSETRON HCL 4 MG/2ML IJ SOLN
INTRAMUSCULAR | Status: DC | PRN
  Administered 2019-03-16: 4 mg via INTRAVENOUS

## 2019-03-16 MED ORDER — SUGAMMADEX SODIUM 200 MG/2ML IV SOLN
INTRAVENOUS | Status: AC
  Filled 2019-03-16: qty 2

## 2019-03-16 MED ORDER — DEXAMETHASONE SODIUM PHOSPHATE 10 MG/ML IJ SOLN
INTRAMUSCULAR | Status: DC | PRN
  Administered 2019-03-16: 10 mg via INTRAVENOUS

## 2019-03-16 MED ORDER — LIDOCAINE 2% (20 MG/ML) 5 ML SYRINGE
INTRAMUSCULAR | Status: AC
  Filled 2019-03-16: qty 5

## 2019-03-16 MED ORDER — CEFAZOLIN SODIUM-DEXTROSE 2-4 GM/100ML-% IV SOLN
2.0000 g | INTRAVENOUS | Status: AC
  Administered 2019-03-16: 11:00:00 2 g via INTRAVENOUS

## 2019-03-16 MED ORDER — TRAMADOL HCL 50 MG PO TABS: 50.0000 mg | ORAL_TABLET | Freq: Four times a day (QID) | ORAL | 0 refills | Status: DC | PRN

## 2019-03-16 MED ORDER — MIDAZOLAM HCL 2 MG/2ML IJ SOLN
INTRAMUSCULAR | Status: AC
  Filled 2019-03-16: qty 2

## 2019-03-16 MED ORDER — CHLORHEXIDINE GLUCONATE CLOTH 2 % EX PADS: 6.0000 | MEDICATED_PAD | Freq: Once | CUTANEOUS | Status: DC

## 2019-03-16 MED ORDER — ONDANSETRON HCL 4 MG/2ML IJ SOLN
INTRAMUSCULAR | Status: AC
  Filled 2019-03-16: qty 2

## 2019-03-16 MED ORDER — PROPOFOL 10 MG/ML IV BOLUS
INTRAVENOUS | Status: DC | PRN
  Administered 2019-03-16: 150 mg via INTRAVENOUS

## 2019-03-16 MED ORDER — BUPIVACAINE HCL (PF) 0.25 % IJ SOLN
INTRAMUSCULAR | Status: AC
  Filled 2019-03-16: qty 30

## 2019-03-16 MED ORDER — EPHEDRINE SULFATE-NACL 50-0.9 MG/10ML-% IV SOSY
PREFILLED_SYRINGE | INTRAVENOUS | Status: DC | PRN
  Administered 2019-03-16: 10 mg via INTRAVENOUS

## 2019-03-16 MED ORDER — CEFAZOLIN SODIUM-DEXTROSE 2-4 GM/100ML-% IV SOLN
INTRAVENOUS | Status: AC
  Filled 2019-03-16: qty 100

## 2019-03-16 MED ORDER — SUGAMMADEX SODIUM 200 MG/2ML IV SOLN
INTRAVENOUS | Status: DC | PRN
  Administered 2019-03-16: 175 mg via INTRAVENOUS

## 2019-03-16 MED ORDER — FENTANYL CITRATE (PF) 250 MCG/5ML IJ SOLN
INTRAMUSCULAR | Status: DC | PRN
  Administered 2019-03-16: 50 ug via INTRAVENOUS
  Administered 2019-03-16: 100 ug via INTRAVENOUS

## 2019-03-16 SURGICAL SUPPLY — 32 items
ADH SKN CLS APL DERMABOND .7 (GAUZE/BANDAGES/DRESSINGS) ×1
APL PRP STRL LF DISP 70% ISPRP (MISCELLANEOUS) ×1
ATTRACTOMAT 16X20 MAGNETIC DRP (DRAPES) ×2 IMPLANT
BLADE SURG 15 STRL LF DISP TIS (BLADE) ×1 IMPLANT
BLADE SURG 15 STRL SS (BLADE) ×2
CHLORAPREP W/TINT 26 (MISCELLANEOUS) ×2 IMPLANT
CLIP VESOCCLUDE MED 6/CT (CLIP) ×4 IMPLANT
CLIP VESOCCLUDE SM WIDE 6/CT (CLIP) ×4 IMPLANT
COVER SURGICAL LIGHT HANDLE (MISCELLANEOUS) ×2 IMPLANT
COVER WAND RF STERILE (DRAPES) ×2 IMPLANT
DERMABOND ADVANCED (GAUZE/BANDAGES/DRESSINGS) ×1
DERMABOND ADVANCED .7 DNX12 (GAUZE/BANDAGES/DRESSINGS) IMPLANT
DRAPE LAPAROTOMY T 98X78 PEDS (DRAPES) ×2 IMPLANT
ELECT PENCIL ROCKER SW 15FT (MISCELLANEOUS) ×2 IMPLANT
ELECT REM PT RETURN 15FT ADLT (MISCELLANEOUS) ×2 IMPLANT
GAUZE 4X4 16PLY RFD (DISPOSABLE) ×2 IMPLANT
GLOVE SURG ORTHO 8.0 STRL STRW (GLOVE) ×2 IMPLANT
GOWN STRL REUS W/TWL XL LVL3 (GOWN DISPOSABLE) ×6 IMPLANT
HEMOSTAT SURGICEL 2X4 FIBR (HEMOSTASIS) IMPLANT
ILLUMINATOR WAVEGUIDE N/F (MISCELLANEOUS) IMPLANT
KIT BASIN OR (CUSTOM PROCEDURE TRAY) ×2 IMPLANT
KIT TURNOVER KIT A (KITS) IMPLANT
NDL HYPO 25X1 1.5 SAFETY (NEEDLE) ×1 IMPLANT
NEEDLE HYPO 25X1 1.5 SAFETY (NEEDLE) ×2 IMPLANT
PACK BASIC VI WITH GOWN DISP (CUSTOM PROCEDURE TRAY) ×2 IMPLANT
SUT MNCRL AB 4-0 PS2 18 (SUTURE) ×2 IMPLANT
SUT VIC AB 3-0 SH 18 (SUTURE) ×2 IMPLANT
SYR BULB IRRIGATION 50ML (SYRINGE) ×2 IMPLANT
SYR CONTROL 10ML LL (SYRINGE) ×2 IMPLANT
TOWEL OR 17X26 10 PK STRL BLUE (TOWEL DISPOSABLE) ×2 IMPLANT
TOWEL OR NON WOVEN STRL DISP B (DISPOSABLE) ×2 IMPLANT
TUBING CONNECTING 10 (TUBING) ×2 IMPLANT

## 2019-03-16 NOTE — Interval H&P Note (Signed)
History and Physical Interval Note:  03/16/2019 10:58 AM  Robert [name]  has presented today for surgery, with the diagnosis of PRIMARY HYPERPARATHYROIDISM.  The various methods of treatment have been discussed with the patient and family. After consideration of risks, benefits and other options for treatment, the patient has consented to    Procedure(s): LEFT INFERIOR PARATHYROIDECTOMY (Left) as a surgical intervention.    The patient's history has been reviewed, patient examined, no change in status, stable for surgery.  I have reviewed the patient's chart and labs.  Questions were answered to the patient's satisfaction.    [facility] [place] [facility] Office: [place]

## 2019-03-16 NOTE — Op Note (Signed)
OPERATIVE REPORT - PARATHYROIDECTOMY  Preoperative diagnosis: Primary hyperparathyroidism  Postop diagnosis: Same  Procedure: Left minimally invasive parathyroidectomy  Surgeon:  Armandina Gemma, MD  Anesthesia: General endotracheal  Estimated blood loss: Minimal  Preparation: ChloraPrep  Indications: Patient is referred by Dr. Carmie Kanner for surgical evaluation and management of primary hyperparathyroidism. Patient had been noted to have mildly elevated calcium levels. Most recent calcium level was measured at 10.6. Patient has had a bone density scan which shows osteopenia. He also has a history of nephrolithiasis. Patient underwent nuclear medicine parathyroid scan on September 19, 2018. 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 Patient is referred for evaluation for possible parathyroidectomy.  Procedure: The patient was prepared in the pre-operative holding area. The patient was brought to the operating room and placed in a supine position on the operating room table. Following administration of general anesthesia, the patient was positioned and then prepped and draped in the usual strict aseptic fashion. After ascertaining that an adequate level of anesthesia been achieved, a neck incision was made with a #15 blade. Dissection was carried through subcutaneous tissues and platysma. Hemostasis was obtained with the electrocautery. Skin flaps were developed circumferentially and a Weitlander retractor was placed for exposure.  Strap muscles were incised in the midline. Strap muscles were reflected lateralley exposing the thyroid lobe. With gentle blunt dissection the thyroid lobe was mobilized.  Dissection was carried through adipose tissue and an enlarged parathyroid gland was identified. It was gently mobilized. Vascular structures were divided between small ligaclips. Care was taken to avoid the recurrent laryngeal nerve and the esophagus. The  parathyroid gland was completely excised. It was submitted to pathology where frozen section confirmed parathyroid tissue consistent with adenoma.  Neck was irrigated with warm saline and good hemostasis was noted. Fibrillar was placed in the operative field. Strap muscles were approximated in the midline with interrupted 3-0 Vicryl sutures. Platysma was closed with interrupted 3-0 Vicryl sutures. Marcaine was infiltrated circumferentially. Skin was closed with a running 4-0 Monocryl subcuticular suture. Wound was washed and dried and Dermabond was applied. Patient was awakened from anesthesia and brought to the recovery room. The patient tolerated the procedure well.   Armandina Gemma, MD [facility] Office: [phone]

## 2019-03-16 NOTE — Anesthesia Postprocedure Evaluation (Signed)
Anesthesia Post Note  Patient: Robert [name]  Procedure(s) Performed: LEFT PARATHYROIDECTOMY (Left Neck)     Patient location during evaluation: PACU Anesthesia Type: General Level of consciousness: awake and alert Pain management: pain level controlled Vital Signs Assessment: post-procedure vital signs reviewed and stable Respiratory status: spontaneous breathing, nonlabored ventilation, respiratory function stable and patient connected to nasal cannula oxygen Cardiovascular status: blood pressure returned to baseline and stable Postop Assessment: no apparent nausea or vomiting Anesthetic complications: no    Last Vitals:  Vitals:   03/16/19 1250 03/16/19 1340  BP: (!) 111/55 112/60  Pulse: (!) 52 (!) 47  Resp: 12 12  Temp: 36.5 C 36.5 C  SpO2: 98% 96%    Last Pain:  Vitals:   03/16/19 1340  TempSrc:   PainSc: 0-No pain                 Tiajuana Amass

## 2019-03-16 NOTE — Anesthesia Procedure Notes (Signed)
Procedure Name: Intubation Date/Time: 03/16/2019 11:22 AM Performed by: Sharlette Dense, CRNA Patient Re-evaluated:Patient Re-evaluated prior to induction Oxygen Delivery Method: Circle system utilized Preoxygenation: Pre-oxygenation with 100% oxygen Induction Type: IV induction and Rapid sequence Laryngoscope Size: Miller and 3 Grade View: Grade I Tube type: Reinforced Tube size: 7.5 mm Number of attempts: 1 Airway Equipment and Method: Stylet Placement Confirmation: ETT inserted through vocal cords under direct vision,  positive ETCO2 and breath sounds checked- equal and bilateral Secured at: 22 cm Tube secured with: Tape Dental Injury: Teeth and Oropharynx as per pre-operative assessment

## 2019-03-16 NOTE — Transfer of Care (Signed)
Immediate Anesthesia Transfer of Care Note  Patient: Robert [name]  Procedure(s) Performed: LEFT PARATHYROIDECTOMY (Left Neck)  Patient Location: PACU  Anesthesia Type:General  Level of Consciousness: awake and alert   Airway & Oxygen Therapy: Patient Spontanous Breathing and Patient connected to face mask oxygen  Post-op Assessment: Report given to RN and Post -op Vital signs reviewed and stable  Post vital signs: Reviewed and stable  Last Vitals:  Vitals Value Taken Time  BP    Temp    Pulse 64 03/16/19 1217  Resp 10 03/16/19 1217  SpO2 100 % 03/16/19 1217  Vitals shown include unvalidated device data.  Last Pain:  Vitals:   03/16/19 0911  TempSrc: Oral  PainSc: 0-No pain      Patients Stated Pain Goal: 4 (16/10/96 0454)  Complications: No apparent anesthesia complications

## 2019-03-17 ENCOUNTER — Encounter (HOSPITAL_COMMUNITY): Payer: Self-pay | Admitting: Surgery

## 2019-03-19 ENCOUNTER — Telehealth: Payer: Self-pay | Admitting: Cardiology

## 2019-03-19 NOTE — Telephone Encounter (Signed)
patient wants Video set up on his computer/smartphone/ consent/ my chart/ pre reg completed

## 2019-03-23 NOTE — Progress Notes (Signed)
Virtual Visit via Video Note   This visit type was conducted due to national recommendations for restrictions regarding the COVID-19 Pandemic (e.g. social distancing) in an effort to limit this patient's exposure and mitigate transmission in our community.  Due to his co-morbid illnesses, this patient is at least at moderate risk for complications without adequate follow up.  This format is felt to be most appropriate for this patient at this time.  All issues noted in this document were discussed and addressed.  A limited physical exam was performed with this format.  Please refer to the patient's chart for his consent to telehealth for [facility].   Date:  03/26/2019   ID:  Robert [name], DOB 08-10-1946, MRN [id number]  Patient Location: Home Provider Location: Home  PCP:  [name], MD  Cardiologist:  Audwin Semper Martinique MD  Electrophysiologist:  None   Evaluation Performed:  Follow-Up Visit  Chief Complaint:  HTN  History of Present Illness:    Robert [name] is a 73 y.o. male with a history of HTN and HL.  He was having  symptoms of facial flushing. Later switched to pravastatin and had the same problem. Now on Lipitor 20 mg daily and tolerating. Was seen in January 2018 with concerns of bradycardia and fatigue. Toprol dose was reduced with improvement. Wore a HR monitor showing average rate of 64 with slowest rate of 50.  ETT in June 2018 was normal. Later developed palpitations and went back to prior Toprol dose. He also has a history of OSA on CPAP and history of coronary calcification. This past month he underwent parathyroidectomy for primary hyperparathyroidism.   The patient does not have symptoms concerning for COVID-19 infection (fever, chills, cough, or new shortness of breath).    Past Medical History:  Diagnosis Date   Anxiety    Arthritis    Knee , Neck   Colon cancer ([place])    a. s/p colon resection in 6294 and 7654   Complication of anesthesia    difficulty urinating after   Edema of right foot    ? 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 following abnormal stress tests. Both reportedly normal;  b. 02/2013 nl myoview.   Sleep apnea with use of continuous positive airway pressure (CPAP)    wears CPAP nightly   Past Surgical History:  Procedure Laterality Date   CALCANEAL OSTEOTOMY Right 04/29/2016   Procedure: RIGHT CALCANEAL OSTEOTOMY, RIGHT MEDIAL CUNEIFORM OSTEOTOMY;  Surgeon: Wylene Simmer, MD;  Location: [place];  Service: Orthopedics;  Laterality: Right;   CARDIAC CATHETERIZATION  07/04/1997   COLON RESECTION  2004   x 2 per patient    COLON SURGERY     colon resection   COLONOSCOPY     ELBOW SURGERY Left 2011   INFECTED OLECRANON BURSITIS SURGERY   FOOT SURGERY Right    GASTROCNEMIUS RECESSION Right 04/29/2016   Procedure: RIGHT GASTROC RECESSION;  Surgeon: Wylene Simmer, MD;  Location: [place];  Service: Orthopedics;  Laterality: Right;   INGUINAL HERNIA REPAIR Right 06/03/2015   Procedure: REPAIR RIGHT INGUINAL HERNIA;  Surgeon: Georganna Skeans, MD;  Location: [place];  Service: General;  Laterality: Right;   INSERTION OF MESH Right 06/03/2015   Procedure: INSERTION OF MESH;  Surgeon: Georganna Skeans, MD;  Location: [place];  Service: General;  Laterality: Right;   JOINT REPLACEMENT Left    L4 DISCECTOMY     PARATHYROIDECTOMY Left 03/16/2019   Procedure: LEFT PARATHYROIDECTOMY;  Surgeon: Armandina Gemma, MD;  [facility];  Service: General;  Laterality: Left;   RETINAL DETACHMENT SURGERY  1998   SEPTIC JOINT Left    [place]bow   TOTAL KNEE ARTHROPLASTY Left 01/06/2015   Procedure: LEFT TOTAL KNEE  ARTHROPLASTY;  Surgeon: Gaynelle Arabian, MD;  [facility];  Service: Orthopedics;  Laterality: Left;   TOTAL KNEE ARTHROPLASTY Right 10/03/2015   Procedure: RIGHT TOTAL KNEE ARTHROPLASTY;  Surgeon: Gaynelle Arabian, MD;  [facility];  Service: Orthopedics;  Laterality: Right;   TRANSURETHRAL RESECTION OF PROSTATE       Current Meds  Medication Sig   acetaminophen (TYLENOL) 500 MG tablet Take 500 mg by mouth at bedtime.   aspirin EC 81 MG tablet Take 81 mg daily by mouth.   atorvastatin (LIPITOR) 20 MG tablet TAKE 1 TABLET BY MOUTH EVERY DAY (Patient taking differently: Take 20 mg by mouth daily. )   cholecalciferol (VITAMIN D3) 25 MCG (1000 UT) tablet Take 1,000 Units by mouth daily.   CIALIS 5 MG tablet Take 5 mg by mouth daily at 3 pm.    diltiazem (CARDIZEM CD) 180 MG 24 hr capsule Take 1 capsule (180 mg total) by mouth at bedtime. (Patient taking differently: Take 180 mg by mouth daily at 3 pm. )   diphenhydrAMINE (BENADRYL) 25 MG tablet Take 25 mg by mouth at bedtime.   fexofenadine (ALLEGRA) 180 MG tablet Take 180 mg by mouth daily.   fluticasone (FLONASE) 50 MCG/ACT nasal spray Place 1 spray into both nostrils daily.    ibuprofen (ADVIL,MOTRIN) 200 MG tablet Take 200 mg by mouth at bedtime.    lidocaine (LMX) 4 % cream Apply 1 application topically at bedtime as needed (foot pain).   lisinopril-hydrochlorothiazide (PRINZIDE,ZESTORETIC) 20-12.5 MG tablet TAKE 1 TABLET BY MOUTH EVERY MORNING.   LORazepam (ATIVAN) 1 MG tablet Take 0.5 mg by mouth 2 (two) times daily as needed for anxiety.   metoprolol succinate (TOPROL-XL) 25 MG 24 hr tablet Take 1 tablet (25 mg total) by mouth daily.   Multiple Vitamin (MULTIVITAMIN WITH MINERALS) TABS tablet Take 1 tablet by mouth daily.   NON FORMULARY CPAP MACHINE   Omega-3 Fatty Acids (FISH OIL) 1000 MG CPDR Take 1,000 mg by mouth daily.    Probiotic Product (PROBIOTIC DAILY PO) Take 1 capsule by mouth daily.    [DISCONTINUED] metoprolol succinate (TOPROL-XL) 25 MG 24 hr tablet TAKE 1 TABLET BY MOUTH TWICE A DAY (Patient taking differently: Take 12.5 mg by mouth 2 (two) times a day. )     Allergies:   Betadine [povidone iodine] and Oxycodone   Social History   Tobacco Use   Smoking status: Never Smoker   Smokeless tobacco: Never Used  Substance Use Topics   Alcohol use: Yes    Alcohol/week: 17.0 standard drinks    Types: 8 Glasses of wine, 8 Shots of liquor, 1 Standard drinks or equivalent per week    Comment: each night 1 glass   Drug use: No     Family Hx: The patient's family history includes Emphysema in his mother; Heart attack in his father; High Cholesterol in an other family member; Hypertension in  his father and another family member.  ROS:   Please see the history of present illness.    All other systems reviewed and are negative.   Prior CV studies:   The following studies were reviewed today:  ETT 03/15/17: Study Highlights   Upsloping ST segment depression ST segment depression was noted during stress in the II, III, aVF, V3, V4 and V5 leads, and returning to baseline after less than 1 minute of recovery.  No T wave inversion was noted during stress.  Negative, adequate stress test.  Excellent exercise capacity.  Blood pressure demonstrated a normal response to exercise.     Labs/Other Tests and Data Reviewed:    EKG:  No ECG reviewed.  Recent Labs: 06/06/2018: ALT 22 03/13/2019: BUN 29; Creatinine, Ser 0.83; Hemoglobin 15.1; Platelets 186; Potassium 4.3; Sodium 137   Recent Lipid Panel Lab Results  Component Value Date/Time   CHOL 163 06/06/2018 08:29 AM   TRIG 107 06/06/2018 08:29 AM   HDL 53 06/06/2018 08:29 AM   CHOLHDL 3.5 01/27/2017 08:09 AM   LDLCALC 89 06/06/2018 08:29 AM   Dated 06/20/18: cholesterol 186, triglycerides 108, HDL 53, LDL 111.   Wt Readings from Last 3 Encounters:  03/26/19 168 lb (76.2 kg)  03/13/19 176 lb 8 oz (80.1 kg)    07/28/18 176 lb 3.2 oz (79.9 kg)     Objective:    Vital Signs:  BP 126/65    Pulse 62    Ht 5\' 9"  (1.753 m)    Wt 168 lb (76.2 kg)    BMI 24.81 kg/m    VITAL SIGNS:  reviewed  General no distress HEENT. Healed neck scar. Sclera are clear Respiration unlabored. Skin dry Neuro nonfocal. Alert and oriented x 3 Mood normal  ASSESSMENT & PLAN:    1. Hypertension. Blood pressure is under excellent control currently. Continue current therapy.  2. Hypercholesterolemia. goal LDL < 100. Improved with increase Lipitor to 20 mg daily.   3. Coronary calcification noted incidentally on a CT scan. This was in the mid RCA distribution. Stress Myoview study 03/23/13 was negative for ischemia. Ejection fraction was normal. ETT in June 2018 was normal. He is asymptomatic. Family history of CAD with father dying at 53 with MI. Continue risk factor modification.  4. Obstructive sleep apnea - on CPAP.  COVID-19 Education: The signs and symptoms of COVID-19 were discussed with the patient and how to seek care for testing (follow up with PCP or arrange E-visit).  The importance of social distancing was discussed today.  Time:   Today, I have spent 10 minutes with the patient with telehealth technology discussing the above problems.     Medication Adjustments/Labs and Tests Ordered: Current medicines are reviewed at length with the patient today.  Concerns regarding medicines are outlined above.   Tests Ordered: No orders of the defined types were placed in this encounter.   Medication Changes: Meds ordered this encounter  Medications   metoprolol succinate (TOPROL-XL) 25 MG 24 hr tablet    Sig: Take 1 tablet (25 mg total) by mouth daily.    Dispense:  180 tablet    Refill:  3    Follow Up:  In Person in 6 month(s)  Signed, Mousa Prout Martinique, MD  03/26/2019 9:16 AM    [place]

## 2019-03-23 NOTE — Telephone Encounter (Signed)
LVM for pt, reminding him of his appt on 03-26-19 with Dr Martinique

## 2019-03-26 ENCOUNTER — Telehealth (INDEPENDENT_AMBULATORY_CARE_PROVIDER_SITE_OTHER): Payer: Medicare Other | Admitting: Cardiology

## 2019-03-26 ENCOUNTER — Encounter: Payer: Self-pay | Admitting: Cardiology

## 2019-03-26 VITALS — BP 126/65 | HR 62 | Ht 69.0 in | Wt 168.0 lb

## 2019-03-26 DIAGNOSIS — E7800 Pure hypercholesterolemia, unspecified: Secondary | ICD-10-CM

## 2019-03-26 DIAGNOSIS — R002 Palpitations: Secondary | ICD-10-CM

## 2019-03-26 DIAGNOSIS — I2510 Atherosclerotic heart disease of native coronary artery without angina pectoris: Secondary | ICD-10-CM

## 2019-03-26 DIAGNOSIS — I10 Essential (primary) hypertension: Secondary | ICD-10-CM | POA: Diagnosis not present

## 2019-03-26 MED ORDER — METOPROLOL SUCCINATE ER 25 MG PO TB24: 25.0000 mg | ORAL_TABLET | Freq: Every day | ORAL | 3 refills | Status: DC

## 2019-03-26 NOTE — Patient Instructions (Addendum)
Medication Instructions:  Change Toprol XL to 25 mg once a day  If you need a refill on your cardiac medications before your next appointment, please call your pharmacy.   Lab work: None  Testing/Procedures: None  Follow-Up: At [facility], you and your health needs are our priority.  As part of our continuing mission to provide you with exceptional heart care, we have created designated Provider Care Teams.  These Care Teams include your primary Cardiologist (physician) and Advanced Practice Providers (APPs -  Physician Assistants and Nurse Practitioners) who all work together to provide you with the care you need, when you need it. You will need a follow up appointment in 6 months.  Please call our office 2 months in advance to schedule this appointment.  You may see Dr. Martinique or one of the following Advanced Practice Providers on your designated Care Team: Almyra Deforest, [place] . Fabian Sharp, PA-C

## 2019-04-17 ENCOUNTER — Other Ambulatory Visit: Payer: Self-pay | Admitting: *Deleted

## 2019-04-17 DIAGNOSIS — Z20822 Contact with and (suspected) exposure to covid-19: Secondary | ICD-10-CM

## 2019-04-21 LAB — NOVEL CORONAVIRUS, NAA: SARS-CoV-2, NAA: NOT DETECTED

## 2019-06-13 ENCOUNTER — Other Ambulatory Visit: Payer: Self-pay | Admitting: Cardiology

## 2019-06-13 DIAGNOSIS — E7800 Pure hypercholesterolemia, unspecified: Secondary | ICD-10-CM

## 2019-06-26 ENCOUNTER — Other Ambulatory Visit: Payer: Self-pay | Admitting: Cardiology

## 2019-07-30 ENCOUNTER — Ambulatory Visit: Payer: Medicare Other | Admitting: Neurology

## 2019-08-17 ENCOUNTER — Other Ambulatory Visit: Payer: Self-pay

## 2019-08-17 DIAGNOSIS — Z20822 Contact with and (suspected) exposure to covid-19: Secondary | ICD-10-CM

## 2019-08-20 LAB — NOVEL CORONAVIRUS, NAA: SARS-CoV-2, NAA: DETECTED — AB

## 2019-08-27 ENCOUNTER — Telehealth: Payer: Self-pay | Admitting: Nurse Practitioner

## 2019-08-27 NOTE — Telephone Encounter (Signed)
Pt is asking for a call to discuss his new CPAP and how to obtain the readings, please call

## 2019-08-27 NOTE — Telephone Encounter (Signed)
Called the patient and there was no answer. I left a detailed message informing the patient that typically the company that he gets the machine from can get him set up on the airview with his machine. It is an app that can be downloaded on the phone or accessed through the computer and the patient can see the same that we see when reviewing the download. As far as how to get set up on that I dont have the answer and I encouraged the patient to reach out to the company he is established with on the CPAP to have them walk him through how to access this.   **If pt calls back please advise that I do have the capability to see his download so we are good on our end but I don't know how to set him up with that feature. Please ask the patient who he gets his machine and supplies from and defer to them to help assist with that.

## 2019-08-27 NOTE — Telephone Encounter (Signed)
Called the patient and ad

## 2019-08-29 ENCOUNTER — Ambulatory Visit: Payer: Medicare Other | Admitting: Neurology

## 2019-09-12 ENCOUNTER — Ambulatory Visit: Payer: Medicare Other | Admitting: Neurology

## 2019-09-12 ENCOUNTER — Encounter: Payer: Self-pay | Admitting: Neurology

## 2019-09-12 ENCOUNTER — Other Ambulatory Visit: Payer: Self-pay

## 2019-09-12 VITALS — BP 112/64 | HR 65 | Temp 96.8°F | Ht 69.0 in | Wt 170.0 lb

## 2019-09-12 DIAGNOSIS — Z87898 Personal history of other specified conditions: Secondary | ICD-10-CM | POA: Insufficient documentation

## 2019-09-12 DIAGNOSIS — G4733 Obstructive sleep apnea (adult) (pediatric): Secondary | ICD-10-CM

## 2019-09-12 DIAGNOSIS — Z9989 Dependence on other enabling machines and devices: Secondary | ICD-10-CM | POA: Diagnosis not present

## 2019-09-12 NOTE — Patient Instructions (Signed)

## 2019-09-12 NOTE — Progress Notes (Signed)
[facility]   SLEEP MEDICINE CLINIC   Provider:  Dr. Asencion Partridge Dorisann Schwanke Referring Provider: [name], MD Primary Care Physician:  [name], MD  Chief Complaint  Patient presents with  . Follow-up    pt alone, rm 11. pt states that new machine is working well. DME [employer]. he has a old machine he brought and would like to donate his old machine.      HPI:  Robert [name] is a 73 y.o. male here as a revisit for Insomnia and OSA , referred originally  from [name] , Urologist.  Dr. [name] is this patient's  PCP.   Robert [name] begun having fragmented sleep and finally  would wake up 5-7 times at night to relieve himself. He tried Flomax without success and then by the recently changed urologists and 2013. [name] finally suspected that sleep apnea may be a contributing cause. He also had seen a dentist about the same time noticed him falling asleep in the dental chair and urgent to get an workup for obstructive sleep apnea. Dr. [name], his primary care physician ordered a sleep study and the patient tested positive for apnea he was titrated to CPAP in a split night procedure the study was performed. The split night procedure took place on 08-16-12 the patient's BMI at that time was 25 his Epworth sleepiness score 4/24 points his neck circumference 15-1/2 inches. He and was to exhalatory at one point. Apnea hypotony index was 18.5 and RDI 18.9 there was no strong REM complement the supine complement. Lowest desaturation was 78% supine REM sleep was 32.8 minutes of desaturation. The patient was titrated to 6 cm water pressure as an AHI of less than 5. He then underwent a  Auto- titration and compliance report through the December 2013 into January 2040,  he used his machine 6 hours 30 minutes at night was 100% compliant , his  AHI  was 2.9 in late January 2014 .Patient uses CPAP at 4 to 8 cm water autoset with a residual AHI of 1.7 and average user time 5 hours  27 minutes. Low air leak, 90 days of 93% compliance.He still has nocturia,  TURP procedure 1995, Epworth 5 and FSS 30 points, Patient has found a 'cocktail " that helps him to sleep , using an antihistamine along with prescription sleep aid.   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  He will bring her CPAP machine [facility] the [facility]. He has been having nightmares on unisom. He is treated with cialis for BPH(!). He is 80% better in terms of Nocturia.  He was on a [place] and woke up 3 times at night, but this was better than at home, and he thinks it was due to his higher level of physicial activity. Lorazepam is used PRN.  I would feel unconcerned about him using a benzodiazepine for the rest of his life.  Interval history from 04/20/2016. With the couple of just returned from his daughter's wedding, with a very exciting event he is 97% compliance with CPAP use uses an AutoSet between 4 and 8 cm water with 27 m EPR his residual AHI is close to ideal at 1.4 per hour. No adjustments need to be made. He has been losing weight , plans to retire this Friday.  He tore his right posterior tibial tendon and plans to undergo surgery.   Patient seen here on 09-12-2019, meanwhile 72 year old-  He sleeps best on sialis, now generic and CPAP, having reduced his nocturia to 2 times from 5-6 times. He can some nights sleep 5 hours straight.  He enjoyed wine, taking Winter, and [employer] cruises, enjoys these tremendously.  He underwent bilateral knee replacement time deferred, and is now walking 5 miles each day pain-free.  He has been able to control weight, hypertension.  His new CPAP machine is an air sense 10 AutoSet between 4 and 8 cmH2O with 1 cm water pressure expiratory relief he has used the machine 29 out of 30 days with over 4 hours with an average user time of 7 5 hours 7 minutes.  Residual AHI is 1.0 apneas per hour which is  physiologically normal the 95th percentile pressure is 7.4 he has mild air leakage, he is not excessively daytime sleepy, he would like to donate his S9 AutoSet machine which she on before with similar settings we will for now and followed by modem the serial number machine [id number].       Review of Systems: Out of a complete 14 system review, the patient complains of only the following symptoms, and all other reviewed systems are negative. Nocturia  is down  from 6-7 times at night to 3 times.   How likely are you to doze in the following situations: 0 = not likely, 1 = slight chance, 2 = moderate chance, 3 = high chance  Sitting and Reading? Watching Television? Sitting inactive in a public place (theater or meeting)? Lying down in the afternoon when circumstances permit? Sitting and talking to someone? Sitting quietly after lunch without alcohol? In a car, while stopped for a few minutes in traffic? As a passenger in a car for an hour without a break?  Total = 6   Epworth Sleepiness Scale today is score today is 5/ 24  points, and  FSS 29 points, and GDS  depression score zero  Point.  No longer nightmares , reduced Nocturia- but not resolved .    Dicylomine as sleep aid permitted, takes Malatonin.  Social History   Socioeconomic History  . Marital status: Married    Spouse name: [name]  . Number of children: 3  . Years of education: Ph.D  . Highest education level: Not on file  Occupational History  . Occupation: ADMIN    Employer: [employer]    Comment: [employer]  Tobacco Use  . Smoking status: Never Smoker  . Smokeless tobacco: Never Used  Substance and Sexual Activity  . Alcohol use: Yes    Alcohol/week: 17.0 standard drinks    Types: 8 Glasses of wine, 8 Shots of liquor, 1 Standard drinks or equivalent per week    Comment: each night 1 glass  . Drug use: No  . Sexual activity: Yes  Other Topics Concern  . Not on file  Social History  Narrative   Sleep apnea 28 AHI was reduced to 3.4- set to  7 cm water pressure on nasal mask eson or wisp.  He sleep  nonsupine, less nocturia.  Tennis ball method explained.  Patient does not want machine set to a certain pressure.     Downloaded CMS compliance s etsablsihed .  Insomnia not improved further - will try seroquel as  medication approach and discussed behaviour therapy approach .   send for n beahiour therapy and started trial of SEROQUEL>  25 minute visit including CMS compliance and downloading.    Patient is married [name])  and lives at home with his wife.   Patient has three children and his wife has three children.   Patient is working full-time.   Patient has a Ph.D.   Patient is right-handed.   Patient drinks about 21 oz of coffee daily.         Social Determinants of Health   Financial Resource Strain:   . Difficulty of Paying Living Expenses: Not on file  Food Insecurity:   . Worried About Charity fundraiser in the Last Year: Not on file  . Ran Out of Food in the Last Year: Not on file  Transportation Needs:   . Lack of Transportation (Medical): Not on file  . Lack of Transportation (Non-Medical): Not on file  Physical Activity:   . Days of Exercise per Week: Not on file  . Minutes of Exercise per Session: Not on file  Stress:   . Feeling of Stress : Not on file  Social Connections:   . Frequency of Communication with Friends and Family: Not on file  . Frequency of Social Gatherings with Friends and Family: Not on file  . Attends Religious Services: Not on file  . Active Member of Clubs or Organizations: Not on file  . Attends Archivist Meetings: Not on file  . Marital Status: Not on file  Intimate Partner Violence:   . Fear of Current or Ex-Partner: Not on file  . Emotionally Abused: Not on file  . Physically Abused: Not on file  . Sexually Abused: Not on file    Family History  Problem Relation Age of Onset  . Emphysema Mother   .  Heart attack Father   . Hypertension Father   . Hypertension Other   . High Cholesterol Other     Past Medical History:  Diagnosis Date  . Anxiety   . Arthritis    Knee , Neck  . Colon cancer [facility])    a. s/p colon resection in 2004 and 2002  . Complication of anesthesia    difficulty urinating after  . Edema of right foot    ? muscle problem to be addressed after left knee TKA on 01/06/2015 per patient   . History of kidney stones   . Hyperlipidemia   . Hypertension   . 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 Nocturia associated with benign prostatic hypertrophy 03/21/2013  . Palpitations   . S/P cardiac catheterization 1995 and 1998   a. following abnormal stress tests. Both reportedly normal;  b. 02/2013 nl myoview.  . Sleep apnea with use of continuous positive airway pressure (CPAP)    wears CPAP nightly    Past Surgical History:  Procedure Laterality Date  . CALCANEAL OSTEOTOMY Right 04/29/2016   Procedure: RIGHT CALCANEAL OSTEOTOMY, RIGHT MEDIAL CUNEIFORM OSTEOTOMY;  Surgeon: Wylene Simmer, MD;  Location: [facility];  Service: Orthopedics;  Laterality: Right;  . CARDIAC CATHETERIZATION  07/04/1997  . COLON RESECTION  2004   x 2 per patient   . COLON SURGERY     colon resection  . COLONOSCOPY    . ELBOW SURGERY Left 2011   INFECTED OLECRANON BURSITIS SURGERY  . FOOT SURGERY Right   . GASTROCNEMIUS RECESSION Right 04/29/2016   Procedure: RIGHT GASTROC RECESSION;  Surgeon: Wylene Simmer, MD;  Location: [facility];  Service: Orthopedics;  Laterality: Right;  . INGUINAL HERNIA REPAIR Right 06/03/2015   Procedure: REPAIR RIGHT INGUINAL HERNIA;  Surgeon: Georganna Skeans, MD;  Location: [facility];  Service: General;  Laterality: Right;  . INSERTION OF MESH Right 06/03/2015   Procedure: INSERTION OF MESH;  Surgeon: Georganna Skeans, MD;  Location: [facility];  Service: General;  Laterality: Right;  . JOINT REPLACEMENT Left   . L4 DISCECTOMY    .  PARATHYROIDECTOMY Left 03/16/2019   Procedure: LEFT PARATHYROIDECTOMY;  Surgeon: Armandina Gemma, MD;  Location: [facility];  Service: General;  Laterality: Left;  . [place]  . SEPTIC JOINT Left    [place]  . TOTAL KNEE ARTHROPLASTY Left 01/06/2015   Procedure: LEFT TOTAL KNEE ARTHROPLASTY;  Surgeon: Gaynelle Arabian, MD;  Location: [facility];  Service: Orthopedics;  Laterality: Left;  . TOTAL KNEE ARTHROPLASTY Right 10/03/2015   Procedure: RIGHT TOTAL KNEE ARTHROPLASTY;  Surgeon: Gaynelle Arabian, MD;  Location: [facility];  Service: Orthopedics;  Laterality: Right;  . TRANSURETHRAL RESECTION OF PROSTATE      Current Outpatient Medications  Medication Sig Dispense Refill  . acetaminophen (TYLENOL) 500 MG tablet Take 500 mg by mouth at bedtime.    Marland Kitchen aspirin EC 81 MG tablet Take 81 mg daily by mouth.    Marland Kitchen atorvastatin (LIPITOR) 40 MG tablet Take 40 mg by mouth daily.    . cholecalciferol (VITAMIN D3) 25 MCG (1000 UT) tablet Take 1,000 Units by mouth daily.    Marland Kitchen CIALIS 5 MG tablet Take 5 mg by mouth daily at 3 pm.     . diltiazem (CARDIZEM CD) 180 MG 24 hr capsule TAKE 1 CAPSULE (180 MG TOTAL) BY MOUTH AT BEDTIME. 90 capsule 1  . diphenhydrAMINE (BENADRYL) 25 MG tablet Take 25 mg by mouth at bedtime.    . fexofenadine (ALLEGRA) 180 MG tablet Take 180 mg by mouth daily.    . fluticasone (FLONASE) 50 MCG/ACT nasal spray Place 1 spray into both nostrils daily.     Marland Kitchen ibuprofen (ADVIL,MOTRIN) 200 MG tablet Take 200 mg by mouth at bedtime.     . lidocaine (LMX) 4 % cream Apply 1 application topically at bedtime as needed (foot pain).    Marland Kitchen lisinopril-hydrochlorothiazide (ZESTORETIC) 20-12.5 MG tablet TAKE 1 TABLET BY MOUTH EVERY MORNING. 90 tablet 3  . LORazepam (ATIVAN) 1 MG tablet Take 0.5 mg by mouth 2 (two) times daily as needed for anxiety.    . metoprolol succinate (TOPROL-XL) 25 MG 24 hr tablet Take 1 tablet (25 mg total) by mouth daily. (Patient taking differently: Take 12.5 mg by mouth  daily. ) 180 tablet 3  . Multiple Vitamin (MULTIVITAMIN WITH MINERALS) TABS tablet Take 1 tablet by mouth daily.    . NON FORMULARY CPAP MACHINE    . Omega-3 Fatty Acids (FISH OIL) 1000 MG CPDR Take 1,000 mg by mouth daily.     . Probiotic Product (PROBIOTIC DAILY PO) Take 1 capsule by mouth daily.    . traMADol (ULTRAM) 50 MG tablet Take 1-2 tablets (50-100 mg total) by mouth every 6 (six) hours as needed. 15 tablet 0   No current facility-administered medications for this visit.    Allergies as of 09/12/2019 - Review Complete 09/12/2019  Allergen Reaction Noted  . Betadine [povidone iodine] Other (See Comments) 12/31/2014  . Oxycodone Other (See Comments) 10/03/2015    Vitals: BP 112/64   Pulse 65   Temp (!) 96.8 F (36 C)   Ht 5\' 9"  (1.753 m)   Wt 170 lb (77.1 kg)   BMI 25.10 kg/m  Last Weight:  Wt Readings from Last 1 Encounters:  09/12/19 170 lb (77.1 kg)   Last Height:   Ht Readings from Last 1 Encounters:  09/12/19 5\' 9"  (1.753 m)   Physical exam:  General: The patient is awake, alert and appears not in acute distress. The patient is well groomed. Head: Normocephalic, atraumatic. Neck is supple. Mallampati 2 , neck circumference:16. No retrognathia.  Cardiovascular:  Regular rate and rhythm , without  murmurs or carotid bruit, and without distended neck veins. Respiratory: Lungs are clear to auscultation. Skin:  Without evidence of edema, or rash Trunk: BMI is  normal posture.  Neurologic exam : The patient is awake and alert, oriented to place and time. Speech is fluent without  dysarthria, dysphonia or aphasia. Mood and affect are appropriate. Cranial nerves:  No loss of smell or taste reported. Pupils are equal and briskly reactive to light.   Extraocular movements  in vertical and horizontal planes intact and without nystagmus. Visual fields by finger perimetry are intact. Hearing to finger rub intact.   Facial sensation intact to fine touch.  Facial motor  strength is symmetric and tongue and uvula move midline. Motor exam:   Normal tone and normal muscle bulk . Sensory:  Fine touch, pinprick and vibration were tested in all extremities. Proprioception is  normal.   Assessment:  After physical and neurologic examination, review of laboratory studies, imaging, neurophysiology testing and pre-existing records, assessment :  OSA is best controlled, there is no further improvement likely. He shall continue to use CPAP ,auto titration 4-8 cm water.  He has had maximum benefit of apnea treatment on nocturia, insomnia.   I will follow yearly.      Larey Seat, MD  09-12-2019

## 2019-09-26 ENCOUNTER — Other Ambulatory Visit: Payer: Self-pay

## 2019-09-26 ENCOUNTER — Ambulatory Visit: Payer: Medicare Other | Admitting: Physician Assistant

## 2019-09-26 ENCOUNTER — Encounter: Payer: Self-pay | Admitting: Physician Assistant

## 2019-09-26 VITALS — BP 109/61 | HR 68 | Temp 97.7°F | Ht 69.0 in | Wt 176.4 lb

## 2019-09-26 DIAGNOSIS — I10 Essential (primary) hypertension: Secondary | ICD-10-CM

## 2019-09-26 DIAGNOSIS — G4733 Obstructive sleep apnea (adult) (pediatric): Secondary | ICD-10-CM

## 2019-09-26 DIAGNOSIS — I2584 Coronary atherosclerosis due to calcified coronary lesion: Secondary | ICD-10-CM

## 2019-09-26 DIAGNOSIS — E7800 Pure hypercholesterolemia, unspecified: Secondary | ICD-10-CM

## 2019-09-26 DIAGNOSIS — Z01810 Encounter for preprocedural cardiovascular examination: Secondary | ICD-10-CM

## 2019-09-26 DIAGNOSIS — R079 Chest pain, unspecified: Secondary | ICD-10-CM | POA: Diagnosis not present

## 2019-09-26 DIAGNOSIS — R072 Precordial pain: Secondary | ICD-10-CM

## 2019-09-26 DIAGNOSIS — I2510 Atherosclerotic heart disease of native coronary artery without angina pectoris: Secondary | ICD-10-CM

## 2019-09-26 NOTE — Patient Instructions (Addendum)
Medication Instructions:   The morning of your Coronary CT you will need to take 2 tablets (50 mg) of your Metoprolol Succinate-Toprol XL 2 hours before   Your physician recommends that you continue on your current medications as directed. Please refer to the Current Medication list given to you today.  *If you need a refill on your cardiac medications before your next appointment, please call your pharmacy*  Lab Work: You will need to have labs (blood work) drawn 1 week prior to your Coronary CT:  BMET If you have labs (blood work) drawn today and your tests are completely normal, you will receive your results only by: Marland Kitchen MyChart Message (if you have MyChart) OR . A paper copy in the mail If you have any lab test that is abnormal or we need to change your treatment, we will call you to review the results.  Testing/Procedures: Your physician has requested that you have cardiac CT. Cardiac computed tomography (CT) is a painless test that uses an x-ray machine to take clear, detailed pictures of your heart. For further information please visit [email or web address][email or web address]. Please follow instruction sheet as given.   Follow-Up: At [facility], you and your health needs are our priority.  As part of our continuing mission to provide you with exceptional heart care, we have created designated Provider Care Teams.  These Care Teams include your primary Cardiologist (physician) and Advanced Practice Providers (APPs -  Physician Assistants and Nurse Practitioners) who all work together to provide you with the care you need, when you need it.  Your next appointment:   3 month(s)  The format for your next appointment:   In Person  Provider:   Peter Martinique, MD  Other Instructions   Your cardiac CT will be scheduled at one of the below locations:   [facility] [address] [place], [place] [address] [phone]  [place] [address] [place], [place] [address] [phone]  If scheduled at [facility], please arrive at the [facility] main entrance of [facility] 30-45 minutes prior to test start time. Proceed to the [facility] Radiology Department (first floor) to check-in and test prep.  If scheduled at [facility], please arrive 15 mins early for check-in and test prep.  Please follow these instructions carefully (unless otherwise directed):  Hold all erectile dysfunction medications at least 3 days (72 hrs) prior to test.  On the Night Before the Test: . Be sure to Drink plenty of water. . Do not consume any caffeinated/decaffeinated beverages or chocolate 12 hours prior to your test. . Do not take any antihistamines 12 hours prior to your test. . If the patient has contrast allergy: ? Patient will need a prescription for Prednisone and very clear instructions (as follows): 1. Prednisone 50 mg - take 13 hours prior to test 2. Take another Prednisone 50 mg 7 hours prior to test 3. Take another Prednisone 50 mg 1 hour prior to test 4. Take Benadryl 50 mg 1 hour prior to test . Patient must complete all four doses of above prophylactic medications. . Patient will need a ride after test due to Benadryl.  On the Day of the Test: . Drink plenty of water. Do not drink any water within one hour of the test. . Do not eat any food 4 hours prior to the test. . You may take your regular medications prior to the test.  .  Take metoprolol (Lopressor) two hours prior to test. . HOLD Furosemide/Hydrochlorothiazide morning of the test. Zestoretic . FEMALES- please wear underwire-free bra if available   *For Clinical Staff only. Please instruct patient the following:*        -Drink plenty of water       -Hold Furosemide/hydrochlorothiazide morning of the test       -Take metoprolol (Lopressor) 2 hours prior to test (if applicable).                  -If HR is less  than 55 BPM- No Beta Blocker                -IF HR is greater than 55 BPM and patient is less than or equal to 16 yrs old Lopressor 100mg  x1.                -If HR is greater than 55 BPM and patient is greater than 65 yrs old Lopressor 50 mg x1.     Do not give Lopressor to patients with an allergy to lopressor or anyone with asthma or active COPD symptoms (currently taking steroids).       After the Test: . Drink plenty of water. . After receiving IV contrast, you may experience a mild flushed feeling. This is normal. . On occasion, you may experience a mild rash up to 24 hours after the test. This is not dangerous. If this occurs, you can take Benadryl 25 mg and increase your fluid intake. . If you experience trouble breathing, this can be serious. If it is severe call 911 IMMEDIATELY. If it is mild, please call our office. . If you take any of these medications: Glipizide/Metformin, Avandament, Glucavance, please do not take 48 hours after completing test unless otherwise instructed.   Once we have confirmed authorization from your insurance company, we will call you to set up a date and time for your test.   For non-scheduling related questions, please contact the cardiac imaging nurse navigator should you have any questions/concerns: Marchia Bond, RN Navigator Cardiac Imaging [facility] [phone] Office

## 2019-09-26 NOTE — Progress Notes (Signed)
Cardiology Office Note:    Date:  09/28/2019   ID:  [name], DOB [birth year], MRN [id number]  PCP:  [name], MD  Cardiologist:  Peter Martinique, MD  Electrophysiologist:  None   Referring MD: [name], MD   Chief Complaint  Patient presents with  . Follow-up    seen for Dr. Martinique.    History of Present Illness:    Robert [name] is a 73 y.o. male with a hx of coronary calcification, OSA on CPAP, HTN and HLD.  There was incidental finding of coronary calcification in mid RCA distribution.  Previous Myoview in June 2014 was negative for ischemia.  EF was normal on the Myoview.  He had history of facial flushing with crestor and pravastatin however able to tolerate Lipitor.  There was concern of bradycardia and fatigue in January 2018.  Toprol dose was reduced with improvement.  Her monitor showed average heart rate of 64 with lowest heart rate 50.  EGD in June 2018 was normal.  He later developed her palpitation and went back to prior Toprol-XL dose.  He was last seen by Dr. Martinique in June 2020 at which time he was doing well.  He did undergo parathyroidectomy for hyperparathyroidism.  Mr. [name] presents today for cardiology office visit.  He denies any worsening shortness of breath or lower extremity edema, however recently he has been noticing at least one episode of chest pressure during walk.  He was diagnosed with Covid a month ago however did not have any symptom at all.  The test was done as part of his travel plan to visit his daughter who graduated from medical school and is currently a first year medical resident.  Unfortunately he had to cancel his travel plans and self quarantine himself for 2 weeks.  Subsequent nucleic acid study was negative 5 days later.  Therefore he is not sure if he truly had Covid.  This past week, while walking outside with his wife he started noticing chest pressure.  This only occurred once and has not recurred since.  We discussed  various options, he is quite interested about coronary CT.    Past Medical History:  Diagnosis Date  . Anxiety   . Arthritis    Knee , Neck  . Colon cancer [facility])    a. s/p colon resection in 2004 and 2002  . Complication of anesthesia    difficulty urinating after  . Edema of right foot    ? muscle problem to be addressed after left knee TKA on 01/06/2015 per patient   . History of kidney stones   . Hyperlipidemia   . Hypertension   . 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 Nocturia associated with benign prostatic hypertrophy 03/21/2013  . Palpitations   . S/P cardiac catheterization 1995 and 1998   a. following abnormal stress tests. Both reportedly normal;  b. 02/2013 nl myoview.  . Sleep apnea with use of continuous positive airway pressure (CPAP)    wears CPAP nightly    Past Surgical History:  Procedure Laterality Date  . CALCANEAL OSTEOTOMY Right 04/29/2016   Procedure: RIGHT CALCANEAL OSTEOTOMY, RIGHT MEDIAL CUNEIFORM OSTEOTOMY;  Surgeon: Wylene Simmer, MD;  Location: [place];  Service: Orthopedics;  Laterality: Right;  . CARDIAC CATHETERIZATION  07/04/1997  . COLON RESECTION  2004   x 2 per patient   . COLON SURGERY     colon resection  . COLONOSCOPY    .  ELBOW SURGERY Left 2011   INFECTED OLECRANON BURSITIS SURGERY  . FOOT SURGERY Right   . GASTROCNEMIUS RECESSION Right 04/29/2016   Procedure: RIGHT GASTROC RECESSION;  Surgeon: Wylene Simmer, MD;  Location: [place];  Service: Orthopedics;  Laterality: Right;  . INGUINAL HERNIA REPAIR Right 06/03/2015   Procedure: REPAIR RIGHT INGUINAL HERNIA;  Surgeon: Georganna Skeans, MD;  Location: [place];  Service: General;  Laterality: Right;  . INSERTION OF MESH Right 06/03/2015   Procedure: INSERTION OF MESH;  Surgeon: Georganna Skeans, MD;  Location: [place];  Service: General;  Laterality: Right;  . JOINT REPLACEMENT Left   . L4 DISCECTOMY    . PARATHYROIDECTOMY Left 03/16/2019    Procedure: LEFT PARATHYROIDECTOMY;  Surgeon: Armandina Gemma, MD;  Location: [facility];  Service: General;  Laterality: Left;  . [place]  . SEPTIC JOINT Left    [place]  . TOTAL KNEE ARTHROPLASTY Left 01/06/2015   Procedure: LEFT TOTAL KNEE ARTHROPLASTY;  Surgeon: Gaynelle Arabian, MD;  Location: [facility];  Service: Orthopedics;  Laterality: Left;  . TOTAL KNEE ARTHROPLASTY Right 10/03/2015   Procedure: RIGHT TOTAL KNEE ARTHROPLASTY;  Surgeon: Gaynelle Arabian, MD;  Location: [facility];  Service: Orthopedics;  Laterality: Right;  . TRANSURETHRAL RESECTION OF PROSTATE      Current Medications: Current Meds  Medication Sig  . acetaminophen (TYLENOL) 500 MG tablet Take 500 mg by mouth at bedtime.  Marland Kitchen aspirin EC 81 MG tablet Take 81 mg daily by mouth.  Marland Kitchen atorvastatin (LIPITOR) 40 MG tablet Take 40 mg by mouth daily.  . cholecalciferol (VITAMIN D3) 25 MCG (1000 UT) tablet Take 1,000 Units by mouth daily.  Marland Kitchen CIALIS 5 MG tablet Take 5 mg by mouth daily at 3 pm.   . diltiazem (CARDIZEM CD) 180 MG 24 hr capsule TAKE 1 CAPSULE (180 MG TOTAL) BY MOUTH AT BEDTIME.  . diphenhydrAMINE (BENADRYL) 25 MG tablet Take 25 mg by mouth at bedtime.  . fexofenadine (ALLEGRA) 180 MG tablet Take 180 mg by mouth daily.  . fluticasone (FLONASE) 50 MCG/ACT nasal spray Place 1 spray into both nostrils daily.   Marland Kitchen ibuprofen (ADVIL,MOTRIN) 200 MG tablet Take 200 mg by mouth at bedtime.   . lidocaine (LMX) 4 % cream Apply 1 application topically at bedtime as needed (foot pain).  Marland Kitchen lisinopril-hydrochlorothiazide (ZESTORETIC) 20-12.5 MG tablet TAKE 1 TABLET BY MOUTH EVERY MORNING.  . LORazepam (ATIVAN) 1 MG tablet Take 0.5 mg by mouth 2 (two) times daily as needed for anxiety.  . metoprolol succinate (TOPROL-XL) 25 MG 24 hr tablet Take 1 tablet (25 mg total) by mouth daily. (Patient taking differently: Take 12.5 mg by mouth daily. )  . Multiple Vitamin (MULTIVITAMIN WITH MINERALS) TABS tablet Take 1 tablet by mouth  daily.  . NON FORMULARY CPAP MACHINE  . Omega-3 Fatty Acids (FISH OIL) 1000 MG CPDR Take 1,000 mg by mouth daily.   . Probiotic Product (PROBIOTIC DAILY PO) Take 1 capsule by mouth daily.     Allergies:   Betadine [povidone iodine] and Oxycodone   Social History   Socioeconomic History  . Marital status: Married    Spouse name: [name]  . Number of children: 3  . Years of education: Ph.D  . Highest education level: Not on file  Occupational History  . Occupation: ADMIN    Employer: [employer]    Comment: [employer]  Tobacco Use  . Smoking status: Never Smoker  . Smokeless tobacco: Never Used  Substance and Sexual Activity  . Alcohol use: Yes    Alcohol/week: 17.0 standard drinks    Types: 8 Glasses of wine, 8 Shots of liquor, 1 Standard drinks or equivalent per week    Comment: each night 1 glass  . Drug use: No  . Sexual activity: Yes  Other Topics Concern  . Not on file  Social History Narrative   Sleep apnea 28 AHI was reduced to 3.4- set to  7 cm water pressure on nasal mask eson or wisp.  He sleep  nonsupine, less nocturia.  Tennis ball method explained.  Patient does not want machine set to a certain pressure.     Downloaded CMS compliance s etsablsihed .  Insomnia not improved further - will try seroquel as  medication approach and discussed behaviour therapy approach .   send for n beahiour therapy and started trial of SEROQUEL>  25 minute visit including CMS compliance and downloading.    Patient is married [name]) and lives at home with his wife.   Patient has three children and his wife has three children.   Patient is working full-time.   Patient has a Ph.D.   Patient is right-handed.   Patient drinks about 21 oz of coffee daily.         Social Determinants of Health   Financial Resource Strain:   . Difficulty of Paying Living Expenses: Not on file  Food Insecurity:   . Worried About Charity fundraiser in the Last Year: Not on file  . Ran Out  of Food in the Last Year: Not on file  Transportation Needs:   . Lack of Transportation (Medical): Not on file  . Lack of Transportation (Non-Medical): Not on file  Physical Activity:   . Days of Exercise per Week: Not on file  . Minutes of Exercise per Session: Not on file  Stress:   . Feeling of Stress : Not on file  Social Connections:   . Frequency of Communication with Friends and Family: Not on file  . Frequency of Social Gatherings with Friends and Family: Not on file  . Attends Religious Services: Not on file  . Active Member of Clubs or Organizations: Not on file  . Attends Archivist Meetings: Not on file  . Marital Status: Not on file     Family History: The patient's family history includes Emphysema in his mother; Heart attack in his father; High Cholesterol in an other family member; Hypertension in his father and another family member.  ROS:   Please see the history of present illness.     All other systems reviewed and are negative.  EKGs/Labs/Other Studies Reviewed:    The following studies were reviewed today:  ETT 03/15/2017 Study Highlights   Upsloping ST segment depression ST segment depression was noted during stress in the II, III, aVF, V3, V4 and V5 leads, and returning to baseline after less than 1 minute of recovery.  No T wave inversion was noted during stress.  Negative, adequate stress test.  Excellent exercise capacity.  Blood pressure demonstrated a normal response to exercise     EKG:  EKG is ordered today.  The ekg ordered today demonstrates normal sinus rhythm, no significant ST-T wave changes  Recent Labs: 03/13/2019: BUN 29; Creatinine, Ser 0.83; Hemoglobin 15.1; Platelets 186; Potassium 4.3; Sodium 137  Recent Lipid Panel    Component Value Date/Time   CHOL 163 06/06/2018 0829   TRIG 107 06/06/2018 0829  HDL 53 06/06/2018 0829   CHOLHDL 3.5 01/27/2017 0809   VLDL 34 (H) 01/27/2017 0809   LDLCALC 89 06/06/2018 0829      Physical Exam:    VS:  BP 109/61   Pulse 68   Temp 97.7 F (36.5 C)   Ht 5\' 9"  (1.753 m)   Wt 176 lb 6.4 oz (80 kg)   SpO2 98%   BMI 26.05 kg/m     Wt Readings from Last 3 Encounters:  09/26/19 176 lb 6.4 oz (80 kg)  09/12/19 170 lb (77.1 kg)  03/26/19 168 lb (76.2 kg)     GEN:  Well nourished, well developed in no acute distress HEENT: Normal NECK: No JVD; No carotid bruits LYMPHATICS: No lymphadenopathy CARDIAC: RRR, no murmurs, rubs, gallops RESPIRATORY:  Clear to auscultation without rales, wheezing or rhonchi  ABDOMEN: Soft, non-tender, non-distended MUSCULOSKELETAL:  No edema; No deformity  SKIN: Warm and dry NEUROLOGIC:  Alert and oriented x 3 PSYCHIATRIC:  Normal affect   ASSESSMENT:    1. Chest pain of uncertain etiology   2. Precordial pain   3. Pre-procedural cardiovascular examination   4. Coronary artery calcification   5. OSA (obstructive sleep apnea)   6. Essential hypertension   7. Pure hypercholesterolemia    PLAN:    In order of problems listed above:  1. Chest pain: Patient had prior history of coronary artery calcification noted on CT.  Recently, he had an episode of exertional chest discomfort.  We discussed various options, at this time, we will proceed with coronary CT  2. Coronary artery calcification: Previous CT image showed coronary calcification in mid RCA territory  3. Hypertension: Blood pressure stable  4. Hyperlipidemia: On Lipitor 40 mg daily.  5. Obstructive sleep apnea: On CPAP.   Medication Adjustments/Labs and Tests Ordered: Current medicines are reviewed at length with the patient today.  Concerns regarding medicines are outlined above.  Orders Placed This Encounter  Procedures  . CT CORONARY MORPH W/CTA COR W/SCORE W/CA W/CM &/OR WO/CM  . CT CORONARY FRACTIONAL FLOW RESERVE DATA PREP  . CT CORONARY FRACTIONAL FLOW RESERVE FLUID ANALYSIS  . Basic metabolic panel  . EKG 12-Lead   No orders of the defined types  were placed in this encounter.   Patient Instructions  Medication Instructions:   The morning of your Coronary CT you will need to take 2 tablets (50 mg) of your Metoprolol Succinate-Toprol XL 2 hours before   Your physician recommends that you continue on your current medications as directed. Please refer to the Current Medication list given to you today.  *If you need a refill on your cardiac medications before your next appointment, please call your pharmacy*  Lab Work: You will need to have labs (blood work) drawn 1 week prior to your Coronary CT:  BMET If you have labs (blood work) drawn today and your tests are completely normal, you will receive your results only by: Marland Kitchen MyChart Message (if you have MyChart) OR . A paper copy in the mail If you have any lab test that is abnormal or we need to change your treatment, we will call you to review the results.  Testing/Procedures: Your physician has requested that you have cardiac CT. Cardiac computed tomography (CT) is a painless test that uses an x-ray machine to take clear, detailed pictures of your heart. For further information please visit [facility]uge[facility]iesta[facility]tn. Please follow instruction sheet as given.   Follow-Up: At [facility], you and your health needs  are our priority.  As part of our continuing mission to provide you with exceptional heart care, we have created designated Provider Care Teams.  These Care Teams include your primary Cardiologist (physician) and Advanced Practice Providers (APPs -  Physician Assistants and Nurse Practitioners) who all work together to provide you with the care you need, when you need it.  Your next appointment:   3 month(s)  The format for your next appointment:   In Person  Provider:   Peter Martinique, MD  Other Instructions   Your cardiac CT will be scheduled at one of the below locations:   [facility] [address] [place], [place] [address] [phone]  [place] [address] [place], [place] [address] [phone]  If scheduled at [facility], please arrive at the [facility] main entrance of [facility] 30-45 minutes prior to test start time. Proceed to the [facility] Radiology Department (first floor) to check-in and test prep.  If scheduled at [facility], please arrive 15 mins early for check-in and test prep.  Please follow these instructions carefully (unless otherwise directed):  Hold all erectile dysfunction medications at least 3 days (72 hrs) prior to test.  On the Night Before the Test: . Be sure to Drink plenty of water. . Do not consume any caffeinated/decaffeinated beverages or chocolate 12 hours prior to your test. . Do not take any antihistamines 12 hours prior to your test. . If the patient has contrast allergy: ? Patient will need a prescription for Prednisone and very clear instructions (as follows): 1. Prednisone 50 mg - take 13 hours prior to test 2. Take another Prednisone 50 mg 7 hours prior to test 3. Take another Prednisone 50 mg 1 hour prior to test 4. Take Benadryl 50 mg 1 hour prior to test . Patient must complete all four doses of above prophylactic medications. . Patient will need a ride after test due to Benadryl.  On the Day of the Test: . Drink plenty of water. Do not drink any water within one hour of the test. . Do not eat any food 4 hours prior to the test. . You may take your regular medications prior to the test.  . Take metoprolol (Lopressor) two hours prior to test. . HOLD Furosemide/Hydrochlorothiazide morning of the test. Zestoretic . FEMALES- please wear underwire-free bra if available   *For Clinical Staff only. Please instruct patient the following:*        -Drink plenty of water       -Hold Furosemide/hydrochlorothiazide morning of the test       -Take metoprolol  (Lopressor) 2 hours prior to test (if applicable).                  -If HR is less than 55 BPM- No Beta Blocker                -IF HR is greater than 55 BPM and patient is less than or equal to 71 yrs old Lopressor 100mg  x1.                -If HR is greater than 55 BPM and patient is greater than 13 yrs old Lopressor 50 mg x1.     Do not give Lopressor to patients with an allergy to lopressor or anyone with asthma or active COPD symptoms (currently taking steroids).       After  the Test: . Drink plenty of water. . After receiving IV contrast, you may experience a mild flushed feeling. This is normal. . On occasion, you may experience a mild rash up to 24 hours after the test. This is not dangerous. If this occurs, you can take Benadryl 25 mg and increase your fluid intake. . If you experience trouble breathing, this can be serious. If it is severe call 911 IMMEDIATELY. If it is mild, please call our office. . If you take any of these medications: Glipizide/Metformin, Avandament, Glucavance, please do not take 48 hours after completing test unless otherwise instructed.   Once we have confirmed authorization from your insurance company, we will call you to set up a date and time for your test.   For non-scheduling related questions, please contact the cardiac imaging nurse navigator should you have any questions/concerns: Marchia Bond, RN Navigator Cardiac Imaging [facility] [phone] Office        Signed, Almyra Deforest, [place]  09/28/2019 10:17 PM    [place]

## 2019-10-25 ENCOUNTER — Ambulatory Visit: Payer: Medicare Other

## 2019-10-26 LAB — BASIC METABOLIC PANEL
BUN/Creatinine Ratio: 20 (ref 10–24)
BUN: 21 mg/dL (ref 8–27)
CO2: 24 mmol/L (ref 20–29)
Calcium: 9.7 mg/dL (ref 8.6–10.2)
Chloride: 99 mmol/L (ref 96–106)
Creatinine, Ser: 1.05 mg/dL (ref 0.76–1.27)
GFR calc Af Amer: 82 mL/min/{1.73_m2} (ref >59)
GFR calc non Af Amer: 71 mL/min/{1.73_m2} (ref >59)
Glucose: 89 mg/dL (ref 65–99)
Potassium: 4.5 mmol/L (ref 3.5–5.2)
Sodium: 136 mmol/L (ref 134–144)

## 2019-10-30 ENCOUNTER — Other Ambulatory Visit: Payer: Self-pay

## 2019-10-30 DIAGNOSIS — R002 Palpitations: Secondary | ICD-10-CM

## 2019-10-30 MED ORDER — METOPROLOL SUCCINATE ER 25 MG PO TB24: 25.0000 mg | ORAL_TABLET | Freq: Every day | ORAL | 3 refills | Status: DC

## 2019-10-31 ENCOUNTER — Encounter (HOSPITAL_COMMUNITY): Payer: Self-pay

## 2019-10-31 ENCOUNTER — Telehealth (HOSPITAL_COMMUNITY): Payer: Self-pay | Admitting: Emergency Medicine

## 2019-10-31 NOTE — Telephone Encounter (Signed)
Reaching out to patient to offer assistance regarding upcoming cardiac imaging study; pt verbalizes understanding of appt date/time, parking situation and where to check in, pre-test NPO status and medications ordered, and verified current allergies; name and call back number provided for further questions should they arise Duchess Armendarez RN Navigator Cardiac Imaging [facility] [phone] office [phone] cell 

## 2019-11-01 ENCOUNTER — Ambulatory Visit
Admission: RE | Admit: 2019-11-01 | Discharge: 2019-11-01 | Disposition: A | Discharge status: Home / Self Care | Payer: Medicare Other | Source: Ambulatory Visit | Attending: Physician Assistant | Admitting: Physician Assistant

## 2019-11-01 ENCOUNTER — Other Ambulatory Visit: Payer: Self-pay

## 2019-11-01 DIAGNOSIS — R072 Precordial pain: Secondary | ICD-10-CM

## 2019-11-01 MED ORDER — IOHEXOL 350 MG/ML SOLN
75.0000 mL | Freq: Once | INTRAVENOUS | Status: AC | PRN
  Administered 2019-11-01: 09:00:00 75 mL via INTRAVENOUS

## 2019-11-01 MED ORDER — NITROGLYCERIN 0.4 MG SL SUBL
0.8000 mg | SUBLINGUAL_TABLET | Freq: Once | SUBLINGUAL | Status: AC
  Administered 2019-11-01: 09:00:00 0.8 mg via SUBLINGUAL

## 2019-11-01 NOTE — Progress Notes (Signed)
Patient tolerated CT without well. Drank coffee and water along with eating crackers and peanut butter. Ambulated steady gait to exit.

## 2019-11-04 ENCOUNTER — Ambulatory Visit: Payer: Medicare Other

## 2019-11-21 ENCOUNTER — Ambulatory Visit: Payer: Medicare Other

## 2019-11-26 ENCOUNTER — Other Ambulatory Visit: Payer: Self-pay

## 2019-11-26 DIAGNOSIS — I2510 Atherosclerotic heart disease of native coronary artery without angina pectoris: Secondary | ICD-10-CM

## 2019-11-26 DIAGNOSIS — I10 Essential (primary) hypertension: Secondary | ICD-10-CM

## 2019-11-26 DIAGNOSIS — E7800 Pure hypercholesterolemia, unspecified: Secondary | ICD-10-CM

## 2019-11-26 DIAGNOSIS — I2584 Coronary atherosclerosis due to calcified coronary lesion: Secondary | ICD-10-CM

## 2019-11-26 NOTE — Progress Notes (Signed)
bmet  

## 2019-11-27 ENCOUNTER — Other Ambulatory Visit: Payer: Self-pay

## 2019-11-27 ENCOUNTER — Telehealth: Payer: Self-pay

## 2019-11-27 LAB — HEPATIC FUNCTION PANEL
ALT: 24 IU/L (ref 0–44)
AST: 22 IU/L (ref 0–40)
Albumin: 4.5 g/dL (ref 3.7–4.7)
Alkaline Phosphatase: 86 IU/L (ref 39–117)
Bilirubin Total: 0.3 mg/dL (ref 0.0–1.2)
Bilirubin, Direct: 0.11 mg/dL (ref 0.00–0.40)
Total Protein: 6.4 g/dL (ref 6.0–8.5)

## 2019-11-27 LAB — BASIC METABOLIC PANEL
BUN/Creatinine Ratio: 18 (ref 10–24)
BUN: 19 mg/dL (ref 8–27)
CO2: 25 mmol/L (ref 20–29)
Calcium: 9.5 mg/dL (ref 8.6–10.2)
Chloride: 99 mmol/L (ref 96–106)
Creatinine, Ser: 1.06 mg/dL (ref 0.76–1.27)
GFR calc Af Amer: 81 mL/min/{1.73_m2} (ref >59)
GFR calc non Af Amer: 70 mL/min/{1.73_m2} (ref >59)
Glucose: 95 mg/dL (ref 65–99)
Potassium: 4.6 mmol/L (ref 3.5–5.2)
Sodium: 137 mmol/L (ref 134–144)

## 2019-11-27 LAB — LIPID PANEL
Chol/HDL Ratio: 3.2 ratio (ref 0.0–5.0)
Cholesterol, Total: 177 mg/dL (ref 100–199)
HDL: 56 mg/dL (ref >39)
LDL Chol Calc (NIH): 105 mg/dL — ABNORMAL HIGH (ref 0–99)
Triglycerides: 85 mg/dL (ref 0–149)
VLDL Cholesterol Cal: 16 mg/dL (ref 5–40)

## 2019-11-27 MED ORDER — LISINOPRIL 20 MG PO TABS: 20.0000 mg | ORAL_TABLET | Freq: Every day | ORAL | 6 refills | Status: DC

## 2019-11-27 NOTE — Telephone Encounter (Signed)
Spoke to patient about email sent today.Dr.Jordan advised to stop lisinopril/hctz.Advised to take lisinopril 20 mg daily.Advised to keep appointment with Dr.Jordan 12/03/19.

## 2019-11-30 NOTE — Progress Notes (Signed)
Cardiology Office Note:    Date:  12/03/2019   ID:  Robert [name], DOB 1946-09-27, MRN [id number]  PCP:  [name], MD  Cardiologist:  Donelle Baba Martinique, MD  Electrophysiologist:  None   Referring MD: [name], MD   Chief Complaint  Patient presents with  . Hypertension    History of Present Illness:    Robert [name] is a 74 y.o. male with a hx of coronary calcification, OSA on CPAP, HTN and HLD.  There was incidental finding of coronary calcification in mid RCA distribution.  Previous Myoview in June 2014 was negative for ischemia.  EF was normal on the Myoview.  He had history of facial flushing with crestor and pravastatin however able to tolerate Lipitor.  There was concern of bradycardia and fatigue in January 2018.  Toprol dose was reduced with improvement.  His monitor showed average heart rate of 64 with lowest heart rate 50.  EGD in June 2018 was normal.  He later developed her palpitation and went back to prior Toprol-XL dose.    He did undergo parathyroidectomy for hyperparathyroidism.  He was seen in December with symptoms of chest pain/pressure.  He was diagnosed with Covid in November  however did not have any symptoms at all.  Subsequent nucleic acid study was negative 5 days later.  Therefore he is not sure if he truly had Covid. He was scheduled for coronary CTA. This showed minimal nonobstructive CAD. Later he went to give blood and had  low BP readings of 100/50 and 98/48 and we recommended stopping HCTZ.  He notes that with the reduction in medication his face feels more ruddy. BP at home 130/80. He feels well. No chest pain. Eats a very heart healthy diet.     Past Medical History:  Diagnosis Date  . Anxiety   . Arthritis    Knee , Neck  . Colon cancer [facility])    a. s/p colon resection in 2004 and 2002  . Complication of anesthesia    difficulty urinating after  . Edema of right foot    ? muscle problem to be addressed after left knee TKA on 01/06/2015  per patient   . History of kidney stones   . Hyperlipidemia   . Hypertension   . 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 Nocturia associated with benign prostatic hypertrophy 03/21/2013  . Palpitations   . S/P cardiac catheterization 1995 and 1998   a. following abnormal stress tests. Both reportedly normal;  b. 02/2013 nl myoview.  . Sleep apnea with use of continuous positive airway pressure (CPAP)    wears CPAP nightly    Past Surgical History:  Procedure Laterality Date  . CALCANEAL OSTEOTOMY Right 04/29/2016   Procedure: RIGHT CALCANEAL OSTEOTOMY, RIGHT MEDIAL CUNEIFORM OSTEOTOMY;  Surgeon: Wylene Simmer, MD;  Location: [place];  Service: Orthopedics;  Laterality: Right;  . CARDIAC CATHETERIZATION  07/04/1997  . COLON RESECTION  2004   x 2 per patient   . COLON SURGERY     colon resection  . COLONOSCOPY    . ELBOW SURGERY Left 2011   INFECTED OLECRANON BURSITIS SURGERY  . FOOT SURGERY Right   . GASTROCNEMIUS RECESSION Right 04/29/2016   Procedure: RIGHT GASTROC RECESSION;  Surgeon: Wylene Simmer, MD;  Location: [place];  Service: Orthopedics;  Laterality: Right;  . INGUINAL HERNIA REPAIR Right 06/03/2015   Procedure: REPAIR RIGHT INGUINAL HERNIA;  Surgeon: Georganna Skeans, MD;  Location:  [place] [facility];  Service: General;  Laterality: Right;  . INSERTION OF MESH Right 06/03/2015   Procedure: INSERTION OF MESH;  Surgeon: Georganna Skeans, MD;  Location: [place];  Service: General;  Laterality: Right;  . JOINT REPLACEMENT Left   . L4 DISCECTOMY    . PARATHYROIDECTOMY Left 03/16/2019   Procedure: LEFT PARATHYROIDECTOMY;  Surgeon: Armandina Gemma, MD;  Location: [facility];  Service: General;  Laterality: Left;  . [place]  . SEPTIC JOINT Left    [place]  . TOTAL KNEE ARTHROPLASTY Left 01/06/2015   Procedure: LEFT TOTAL KNEE ARTHROPLASTY;  Surgeon: Gaynelle Arabian, MD;  Location: [facility];  Service: Orthopedics;  Laterality: Left;  . TOTAL KNEE  ARTHROPLASTY Right 10/03/2015   Procedure: RIGHT TOTAL KNEE ARTHROPLASTY;  Surgeon: Gaynelle Arabian, MD;  Location: [facility];  Service: Orthopedics;  Laterality: Right;  . TRANSURETHRAL RESECTION OF PROSTATE      Current Medications: Current Meds  Medication Sig  . acetaminophen (TYLENOL) 500 MG tablet Take 500 mg by mouth at bedtime.  Marland Kitchen aspirin EC 81 MG tablet Take 81 mg daily by mouth.  Marland Kitchen atorvastatin (LIPITOR) 40 MG tablet Take 40 mg by mouth daily.  . cholecalciferol (VITAMIN D3) 25 MCG (1000 UT) tablet Take 1,000 Units by mouth daily.  Marland Kitchen CIALIS 5 MG tablet Take 5 mg by mouth daily at 3 pm.   . diltiazem (CARDIZEM CD) 180 MG 24 hr capsule TAKE 1 CAPSULE (180 MG TOTAL) BY MOUTH AT BEDTIME.  . diphenhydrAMINE (BENADRYL) 25 MG tablet Take 25 mg by mouth at bedtime.  . fexofenadine (ALLEGRA) 180 MG tablet Take 180 mg by mouth daily.  . fluticasone (FLONASE) 50 MCG/ACT nasal spray Place 1 spray into both nostrils daily.   Marland Kitchen ibuprofen (ADVIL,MOTRIN) 200 MG tablet Take 200 mg by mouth at bedtime.   . lidocaine (LMX) 4 % cream Apply 1 application topically at bedtime as needed (foot pain).  Marland Kitchen lisinopril (ZESTRIL) 20 MG tablet Take 1 tablet (20 mg total) by mouth daily.  Marland Kitchen LORazepam (ATIVAN) 1 MG tablet Take 0.5 mg by mouth 2 (two) times daily as needed for anxiety.  . metoprolol succinate (TOPROL-XL) 25 MG 24 hr tablet Take 1 tablet (25 mg total) by mouth daily.  . Multiple Vitamin (MULTIVITAMIN WITH MINERALS) TABS tablet Take 1 tablet by mouth daily.  . NON FORMULARY CPAP MACHINE  . Omega-3 Fatty Acids (FISH OIL) 1000 MG CPDR Take 1,000 mg by mouth daily.   . Probiotic Product (PROBIOTIC DAILY PO) Take 1 capsule by mouth daily.     Allergies:   Betadine [povidone iodine] and Oxycodone   Social History   Socioeconomic History  . Marital status: Married    Spouse name: [name]  . Number of children: 3  . Years of education: Ph.D  . Highest education level: Not on file  Occupational History   . Occupation: ADMIN    Employer: [employer]    Comment: [employer]  Tobacco Use  . Smoking status: Never Smoker  . Smokeless tobacco: Never Used  Substance and Sexual Activity  . Alcohol use: Yes    Alcohol/week: 17.0 standard drinks    Types: 8 Glasses of wine, 8 Shots of liquor, 1 Standard drinks or equivalent per week    Comment: each night 1 glass  . Drug use: No  . Sexual activity: Yes  Other Topics Concern  . Not on file  Social History Narrative   Sleep apnea 28 AHI was reduced  to 3.4- set to  7 cm water pressure on nasal mask eson or wisp.  He sleep  nonsupine, less nocturia.  Tennis ball method explained.  Patient does not want machine set to a certain pressure.     Downloaded CMS compliance s etsablsihed .  Insomnia not improved further - will try seroquel as  medication approach and discussed behaviour therapy approach .   send for n beahiour therapy and started trial of SEROQUEL>  25 minute visit including CMS compliance and downloading.    Patient is married [name]) and lives at home with his wife.   Patient has three children and his wife has three children.   Patient is working full-time.   Patient has a Ph.D.   Patient is right-handed.   Patient drinks about 21 oz of coffee daily.         Social Determinants of Health   Financial Resource Strain:   . Difficulty of Paying Living Expenses: Not on file  Food Insecurity:   . Worried About Charity fundraiser in the Last Year: Not on file  . Ran Out of Food in the Last Year: Not on file  Transportation Needs:   . Lack of Transportation (Medical): Not on file  . Lack of Transportation (Non-Medical): Not on file  Physical Activity:   . Days of Exercise per Week: Not on file  . Minutes of Exercise per Session: Not on file  Stress:   . Feeling of Stress : Not on file  Social Connections:   . Frequency of Communication with Friends and Family: Not on file  . Frequency of Social Gatherings with Friends  and Family: Not on file  . Attends Religious Services: Not on file  . Active Member of Clubs or Organizations: Not on file  . Attends Archivist Meetings: Not on file  . Marital Status: Not on file     Family History: The patient's family history includes Emphysema in his mother; Heart attack in his father; High Cholesterol in an other family member; Hypertension in his father and another family member.  ROS:   Please see the history of present illness.     All other systems reviewed and are negative.  EKGs/Labs/Other Studies Reviewed:    The following studies were reviewed today:  ETT 03/15/2017 Study Highlights   Upsloping ST segment depression ST segment depression was noted during stress in the II, III, aVF, V3, V4 and V5 leads, and returning to baseline after less than 1 minute of recovery.  No T wave inversion was noted during stress.  Negative, adequate stress test.  Excellent exercise capacity.  Blood pressure demonstrated a normal response to exercise   Coronary CTA 11/18/19:  OVER-READ INTERPRETATION  CT CHEST  The following report is an over-read performed by radiologist Dr. Norlene [facility] on 11/02/2019. This over-read does not include interpretation of cardiac or coronary anatomy or pathology. The coronary CTA interpretation by the cardiologist is attached.  COMPARISON:  None.  FINDINGS: Cardiovascular: Mild cardiac enlargement. No pericardial effusion. Mild aortic atherosclerosis identified.  Mediastinum/nodes: No mass or adenopathy identified.  Lungs/pleura: No pleural effusion or airspace consolidation. Dependent changes including increased ground-glass attenuation and interstitial thickening identified in the posterior lung bases.  Upper abdomen: No acute abnormality.  Musculoskeletal: No acute or significant osseous findings.  IMPRESSION: 1. No significant noncardiac findings identified. Dependent  type changes noted within both lung bases. 2.  Aortic Atherosclerosis (ICD10-I70.0).   Electronically Signed  By: Kerby Moors M.D.   On: 11/02/2019 08:35   Addended by Kerby Moors, MD on 11/02/2019 8:37 AM    Study Result  CLINICAL DATA:  Hx of htn, hld obesity, abnormal ETT  EXAM: Cardiac/Coronary  CTA  TECHNIQUE: The patient was scanned on a Siemens Somatoform go.Top scanner.  FINDINGS: A retrospective scan was triggered in the descending thoracic aorta. Axial non-contrast 3 mm slices were carried out through the heart. The data set was analyzed on a dedicated work station and scored using the [place]. Gantry rotation speed was 330 msecs and collimation was .6 mm. 50mg  of metoprolol and 0.8 mg of sl NTG was given. The 3D data set was reconstructed in 5% intervals of the 60-95 % of the R-R cycle. Diastolic phases were analyzed on a dedicated work station using MPR, MIP and VRT modes. The patient received 75 cc of contrast.  Aorta: Normal size. Mild aortic wall calcifications noted in the ascending and descending thoracic segment. No dissection.  Aortic Valve:  Trileaflet.  No calcifications.  Coronary Arteries:  Normal coronary origin.  Right dominance.  RCA is a large dominant artery that gives rise to PDA and PLA. There is minimal calcified plaque in the mid RCA causing non obstructive disease (0-24%).  Left main is a large artery that gives rise to LAD and LCX arteries.  LAD is a large vessel that has mild calcified plaque the mid segment causing minimal nonobstructive disease (0-24%).  LCX is a non-dominant artery that gives rise to 2 obtuse marginal branches. There is no plaque.  Other findings:  Normal pulmonary vein drainage into the left atrium.  Normal left atrial appendage without a thrombus.  Normal size of the pulmonary artery.  IMPRESSION: 1. Coronary calcium score of 25.7. This was 23rd percentile for age and  sex matched control.  2. Mild ascending and descending thoracic aortic wall calcifications  3. Normal coronary origin with right dominance.  4.  Minimal non obstructive calcified plaque in the mid RCA and LAD  5. CAD-RADS 1. Minimal non-obstructive CAD (0-24%). Consider non-atherosclerotic causes of chest pain. Consider preventive therapy and risk factor modification.  Electronically Signed: By: Kate Sable M.D. On: 11/01/2019 15:47       EKG:  EKG is not ordered today.    Recent Labs: 03/13/2019: Hemoglobin 15.1; Platelets 186 11/27/2019: ALT 24; BUN 19; Creatinine, Ser 1.06; Potassium 4.6; Sodium 137  Recent Lipid Panel    Component Value Date/Time   CHOL 177 11/27/2019 0819   TRIG 85 11/27/2019 0819   HDL 56 11/27/2019 0819   CHOLHDL 3.2 11/27/2019 0819   CHOLHDL 3.5 01/27/2017 0809   VLDL 34 (H) 01/27/2017 0809   LDLCALC 105 (H) 11/27/2019 0819   Dated 07/17/19: cholesterol 183, triglycerides 84, HDL 54, LDL 112   Physical Exam:    VS:  BP 131/78   Pulse 65   Temp (!) 97.5 F (36.4 C)   Resp (!) 21   Ht 5\' 9"  (1.753 m)   Wt 175 lb 9.6 oz (79.7 kg)   SpO2 98%   BMI 25.93 kg/m     Wt Readings from Last 3 Encounters:  12/03/19 175 lb 9.6 oz (79.7 kg)  09/26/19 176 lb 6.4 oz (80 kg)  09/12/19 170 lb (77.1 kg)     GEN:  Well nourished, well developed in no acute distress HEENT: Normal NECK: No JVD; No carotid bruits LYMPHATICS: No lymphadenopathy CARDIAC: RRR, no murmurs, rubs, gallops RESPIRATORY:  Clear  to auscultation without rales, wheezing or rhonchi  ABDOMEN: Soft, non-tender, non-distended MUSCULOSKELETAL:  No edema; No deformity  SKIN: Warm and dry NEUROLOGIC:  Alert and oriented x 3 PSYCHIATRIC:  Normal affect   ASSESSMENT:    No diagnosis found. PLAN:    In order of problems listed above:  1. CAD/Coronary artery calcification: Previous CT image showed coronary calcification in mid RCA territory. Calcium score is low for his  age and could have been related to his history of hyperparathyroidism. His CTA is very low risk with minimal atherosclerosis. reassures  2. Hypertension: Blood pressure stable. Will monitor off HCTZ for now.   3. Hyperlipidemia: On Lipitor 40 mg daily. Reports last LDL 105. Given results of CTA I am happy continuing current dose of statin.  4. Obstructive sleep apnea: On CPAP.   Medication Adjustments/Labs and Tests Ordered: Current medicines are reviewed at length with the patient today.  Concerns regarding medicines are outlined above.  No orders of the defined types were placed in this encounter.  No orders of the defined types were placed in this encounter.   There are no Patient Instructions on file for this visit.   Signed, Erman Thum [place], MD  12/03/2019 9:40 AM    [place]

## 2019-12-03 ENCOUNTER — Encounter: Payer: Self-pay | Admitting: Cardiology

## 2019-12-03 ENCOUNTER — Other Ambulatory Visit: Payer: Self-pay

## 2019-12-03 ENCOUNTER — Ambulatory Visit: Payer: Medicare Other | Admitting: Cardiology

## 2019-12-03 VITALS — BP 131/78 | HR 65 | Temp 97.5°F | Resp 21 | Ht 69.0 in | Wt 175.6 lb

## 2019-12-03 DIAGNOSIS — E7800 Pure hypercholesterolemia, unspecified: Secondary | ICD-10-CM

## 2019-12-03 DIAGNOSIS — I10 Essential (primary) hypertension: Secondary | ICD-10-CM

## 2019-12-03 DIAGNOSIS — I2510 Atherosclerotic heart disease of native coronary artery without angina pectoris: Secondary | ICD-10-CM

## 2019-12-07 DIAGNOSIS — M25521 Pain in right elbow: Secondary | ICD-10-CM | POA: Insufficient documentation

## 2019-12-20 ENCOUNTER — Ambulatory Visit: Payer: Medicare Other | Admitting: Cardiology

## 2019-12-31 ENCOUNTER — Other Ambulatory Visit: Payer: Self-pay | Admitting: Cardiology

## 2020-02-19 ENCOUNTER — Other Ambulatory Visit: Payer: Self-pay | Admitting: Cardiology

## 2020-02-19 MED ORDER — LISINOPRIL 20 MG PO TABS: 20.0000 mg | ORAL_TABLET | Freq: Every day | ORAL | 3 refills | Status: DC

## 2020-04-16 DIAGNOSIS — M79672 Pain in left foot: Secondary | ICD-10-CM | POA: Insufficient documentation

## 2020-04-16 DIAGNOSIS — M25511 Pain in right shoulder: Secondary | ICD-10-CM | POA: Insufficient documentation

## 2020-05-05 ENCOUNTER — Telehealth: Payer: Self-pay | Admitting: Cardiology

## 2020-05-05 NOTE — Telephone Encounter (Signed)
LVM for patient to return call to get follow up scheduled with [facility] from recall list 

## 2020-05-15 DIAGNOSIS — M7021 Olecranon bursitis, right elbow: Secondary | ICD-10-CM | POA: Insufficient documentation

## 2020-06-03 NOTE — Progress Notes (Deleted)
Cardiology Office Note:    Date:  06/03/2020   ID:  Robert [name], DOB 1946/07/11, MRN [id number]  PCP:  [name], MD  Cardiologist:  Scottie Metayer Martinique, MD  Electrophysiologist:  None   Referring MD: [name], MD   No chief complaint on file.   History of Present Illness:    Robert [name] is a 74 y.o. male with a hx of coronary calcification, OSA on CPAP, HTN and HLD.  There was incidental finding of coronary calcification in mid RCA distribution.  Previous Myoview in June 2014 was negative for ischemia.  EF was normal on the Myoview.  He had history of facial flushing with crestor and pravastatin however able to tolerate Lipitor.  There was concern of bradycardia and fatigue in January 2018.  Toprol dose was reduced with improvement.  His monitor showed average heart rate of 64 with lowest heart rate 50.  EGD in June 2018 was normal.  He later developed her palpitation and went back to prior Toprol-XL dose.    He did undergo parathyroidectomy for hyperparathyroidism.  He was seen in December with symptoms of chest pain/pressure.  He was diagnosed with Covid in November  however did not have any symptoms at all.  Subsequent nucleic acid study was negative 5 days later.  Therefore he is not sure if he truly had Covid. He was scheduled for coronary CTA. This showed minimal nonobstructive CAD. Later he went to give blood and had  low BP readings of 100/50 and 98/48 and we recommended stopping HCTZ.  He notes that with the reduction in medication his face feels more ruddy. BP at home 130/80. He feels well. No chest pain. Eats a very heart healthy diet.     Past Medical History:  Diagnosis Date  . Anxiety   . Arthritis    Knee , Neck  . Colon cancer [facility])    a. s/p colon resection in 2004 and 2002  . Complication of anesthesia    difficulty urinating after  . Edema of right foot    ? muscle problem to be addressed after left knee TKA on 01/06/2015 per patient   . History of  kidney stones   . Hyperlipidemia   . Hypertension   . 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 Nocturia associated with benign prostatic hypertrophy 03/21/2013  . Palpitations   . S/P cardiac catheterization 1995 and 1998   a. following abnormal stress tests. Both reportedly normal;  b. 02/2013 nl myoview.  . Sleep apnea with use of continuous positive airway pressure (CPAP)    wears CPAP nightly    Past Surgical History:  Procedure Laterality Date  . CALCANEAL OSTEOTOMY Right 04/29/2016   Procedure: RIGHT CALCANEAL OSTEOTOMY, RIGHT MEDIAL CUNEIFORM OSTEOTOMY;  Surgeon: Wylene Simmer, MD;  Location: [place];  Service: Orthopedics;  Laterality: Right;  . CARDIAC CATHETERIZATION  07/04/1997  . COLON RESECTION  2004   x 2 per patient   . COLON SURGERY     colon resection  . COLONOSCOPY    . ELBOW SURGERY Left 2011   INFECTED OLECRANON BURSITIS SURGERY  . FOOT SURGERY Right   . GASTROCNEMIUS RECESSION Right 04/29/2016   Procedure: RIGHT GASTROC RECESSION;  Surgeon: Wylene Simmer, MD;  Location: [place];  Service: Orthopedics;  Laterality: Right;  . INGUINAL HERNIA REPAIR Right 06/03/2015   Procedure: REPAIR RIGHT INGUINAL HERNIA;  Surgeon: Georganna Skeans, MD;  Location: [place];  Service: General;  Laterality: Right;  . INSERTION OF MESH Right 06/03/2015   Procedure: INSERTION OF MESH;  Surgeon: Georganna Skeans, MD;  Location: [place];  Service: General;  Laterality: Right;  . JOINT REPLACEMENT Left   . L4 DISCECTOMY    . PARATHYROIDECTOMY Left 03/16/2019   Procedure: LEFT PARATHYROIDECTOMY;  Surgeon: Armandina Gemma, MD;  Location: [facility];  Service: General;  Laterality: Left;  . [place]  . SEPTIC JOINT Left    Elbow  . TOTAL KNEE ARTHROPLASTY Left 01/06/2015   Procedure: LEFT TOTAL KNEE ARTHROPLASTY;  Surgeon: Gaynelle Arabian, MD;  Location: [facility];  Service: Orthopedics;  Laterality: Left;  . TOTAL KNEE ARTHROPLASTY Right 10/03/2015    Procedure: RIGHT TOTAL KNEE ARTHROPLASTY;  Surgeon: Gaynelle Arabian, MD;  Location: [facility];  Service: Orthopedics;  Laterality: Right;  . TRANSURETHRAL RESECTION OF PROSTATE      Current Medications: No outpatient medications have been marked as taking for the 06/09/20 encounter (Appointment) with Martinique, Tayquan Gassman M, MD.     Allergies:   Betadine [povidone iodine] and Oxycodone   Social History   Socioeconomic History  . Marital status: Married    Spouse name: [name]  . Number of children: 3  . Years of education: Ph.D  . Highest education level: Not on file  Occupational History  . Occupation: ADMIN    Employer: [employer]    Comment: [employer]  Tobacco Use  . Smoking status: Never Smoker  . Smokeless tobacco: Never Used  Vaping Use  . Vaping Use: Never used  Substance and Sexual Activity  . Alcohol use: Yes    Alcohol/week: 17.0 standard drinks    Types: 8 Glasses of wine, 8 Shots of liquor, 1 Standard drinks or equivalent per week    Comment: each night 1 glass  . Drug use: No  . Sexual activity: Yes  Other Topics Concern  . Not on file  Social History Narrative   Sleep apnea 28 AHI was reduced to 3.4- set to  7 cm water pressure on nasal mask eson or wisp.  He sleep  nonsupine, less nocturia.  Tennis ball method explained.  Patient does not want machine set to a certain pressure.     Downloaded CMS compliance s etsablsihed .  Insomnia not improved further - will try seroquel as  medication approach and discussed behaviour therapy approach .   send for n beahiour therapy and started trial of SEROQUEL>  25 minute visit including CMS compliance and downloading.    Patient is married [name]) and lives at home with his wife.   Patient has three children and his wife has three children.   Patient is working full-time.   Patient has a Ph.D.   Patient is right-handed.   Patient drinks about 21 oz of coffee daily.         Social Determinants of Health    Financial Resource Strain:   . Difficulty of Paying Living Expenses: Not on file  Food Insecurity:   . Worried About Charity fundraiser in the Last Year: Not on file  . Ran Out of Food in the Last Year: Not on file  Transportation Needs:   . Lack of Transportation (Medical): Not on file  . Lack of Transportation (Non-Medical): Not on file  Physical Activity:   . Days of Exercise per Week: Not on file  . Minutes of Exercise per Session: Not on file  Stress:   . Feeling of Stress : Not  on file  Social Connections:   . Frequency of Communication with Friends and Family: Not on file  . Frequency of Social Gatherings with Friends and Family: Not on file  . Attends Religious Services: Not on file  . Active Member of Clubs or Organizations: Not on file  . Attends Archivist Meetings: Not on file  . Marital Status: Not on file     Family History: The patient's family history includes Emphysema in his mother; Heart attack in his father; High Cholesterol in an other family member; Hypertension in his father and another family member.  ROS:   Please see the history of present illness.     All other systems reviewed and are negative.  EKGs/Labs/Other Studies Reviewed:    The following studies were reviewed today:  ETT 03/15/2017 Study Highlights   Upsloping ST segment depression ST segment depression was noted during stress in the II, III, aVF, V3, V4 and V5 leads, and returning to baseline after less than 1 minute of recovery.  No T wave inversion was noted during stress.  Negative, adequate stress test.  Excellent exercise capacity.  Blood pressure demonstrated a normal response to exercise   Coronary CTA 11/18/19:  OVER-READ INTERPRETATION  CT CHEST  The following report is an over-read performed by radiologist Dr. Norlene Duel [facility] on 11/02/2019. This over-read does not include interpretation of cardiac or coronary anatomy or pathology.  The coronary CTA interpretation by the cardiologist is attached.  COMPARISON:  None.  FINDINGS: Cardiovascular: Mild cardiac enlargement. No pericardial effusion. Mild aortic atherosclerosis identified.  Mediastinum/nodes: No mass or adenopathy identified.  Lungs/pleura: No pleural effusion or airspace consolidation. Dependent changes including increased ground-glass attenuation and interstitial thickening identified in the posterior lung bases.  Upper abdomen: No acute abnormality.  Musculoskeletal: No acute or significant osseous findings.  IMPRESSION: 1. No significant noncardiac findings identified. Dependent type changes noted within both lung bases. 2.  Aortic Atherosclerosis (ICD10-I70.0).   Electronically Signed   By: Kerby Moors M.D.   On: 11/02/2019 08:35   Addended by Kerby Moors, MD on 11/02/2019 8:37 AM    Study Result  CLINICAL DATA:  Hx of htn, hld obesity, abnormal ETT  EXAM: Cardiac/Coronary  CTA  TECHNIQUE: The patient was scanned on a Siemens Somatoform go.Top scanner.  FINDINGS: A retrospective scan was triggered in the descending thoracic aorta. Axial non-contrast 3 mm slices were carried out through the heart. The data set was analyzed on a dedicated work station and scored using the [place]. Gantry rotation speed was 330 msecs and collimation was .6 mm. 50mg  of metoprolol and 0.8 mg of sl NTG was given. The 3D data set was reconstructed in 5% intervals of the 60-95 % of the R-R cycle. Diastolic phases were analyzed on a dedicated work station using MPR, MIP and VRT modes. The patient received 75 cc of contrast.  Aorta: Normal size. Mild aortic wall calcifications noted in the ascending and descending thoracic segment. No dissection.  Aortic Valve:  Trileaflet.  No calcifications.  Coronary Arteries:  Normal coronary origin.  Right dominance.  RCA is a large dominant artery that gives rise to PDA and PLA.  There is minimal calcified plaque in the mid RCA causing non obstructive disease (0-24%).  Left main is a large artery that gives rise to LAD and LCX arteries.  LAD is a large vessel that has mild calcified plaque the mid segment causing minimal nonobstructive disease (0-24%).  LCX is a  non-dominant artery that gives rise to 2 obtuse marginal branches. There is no plaque.  Other findings:  Normal pulmonary vein drainage into the left atrium.  Normal left atrial appendage without a thrombus.  Normal size of the pulmonary artery.  IMPRESSION: 1. Coronary calcium score of 25.7. This was 23rd percentile for age and sex matched control.  2. Mild ascending and descending thoracic aortic wall calcifications  3. Normal coronary origin with right dominance.  4.  Minimal non obstructive calcified plaque in the mid RCA and LAD  5. CAD-RADS 1. Minimal non-obstructive CAD (0-24%). Consider non-atherosclerotic causes of chest pain. Consider preventive therapy and risk factor modification.  Electronically Signed: By: Kate Sable M.D. On: 11/01/2019 15:47       EKG:  EKG is not ordered today.    Recent Labs: 11/27/2019: ALT 24; BUN 19; Creatinine, Ser 1.06; Potassium 4.6; Sodium 137  Recent Lipid Panel    Component Value Date/Time   CHOL 177 11/27/2019 0819   TRIG 85 11/27/2019 0819   HDL 56 11/27/2019 0819   CHOLHDL 3.2 11/27/2019 0819   CHOLHDL 3.5 01/27/2017 0809   VLDL 34 (H) 01/27/2017 0809   LDLCALC 105 (H) 11/27/2019 0819   Dated 07/17/19: cholesterol 183, triglycerides 84, HDL 54, LDL 112   Physical Exam:    VS:  There were no vitals taken for this visit.    Wt Readings from Last 3 Encounters:  12/03/19 175 lb 9.6 oz (79.7 kg)  09/26/19 176 lb 6.4 oz (80 kg)  09/12/19 170 lb (77.1 kg)     GEN:  Well nourished, well developed in no acute distress HEENT: Normal NECK: No JVD; No carotid bruits LYMPHATICS: No lymphadenopathy CARDIAC: RRR,  no murmurs, rubs, gallops RESPIRATORY:  Clear to auscultation without rales, wheezing or rhonchi  ABDOMEN: Soft, non-tender, non-distended MUSCULOSKELETAL:  No edema; No deformity  SKIN: Warm and dry NEUROLOGIC:  Alert and oriented x 3 PSYCHIATRIC:  Normal affect   ASSESSMENT:    No diagnosis found. PLAN:    In order of problems listed above:  1. CAD/Coronary artery calcification: Previous CT image showed coronary calcification in mid RCA territory. Calcium score is low for his age and could have been related to his history of hyperparathyroidism. His CTA is very low risk with minimal atherosclerosis. reassures  2. Hypertension: Blood pressure stable. Will monitor off HCTZ for now.   3. Hyperlipidemia: On Lipitor 40 mg daily. Reports last LDL 105. Given results of CTA I am happy continuing current dose of statin.  4. Obstructive sleep apnea: On CPAP.   Medication Adjustments/Labs and Tests Ordered: Current medicines are reviewed at length with the patient today.  Concerns regarding medicines are outlined above.  No orders of the defined types were placed in this encounter.  No orders of the defined types were placed in this encounter.   There are no Patient Instructions on file for this visit.   Signed, Yetunde Leis Martinique, MD  06/03/2020 1:44 PM    [facility]

## 2020-06-09 ENCOUNTER — Ambulatory Visit: Payer: Medicare Other | Admitting: Cardiology

## 2020-06-24 ENCOUNTER — Other Ambulatory Visit: Payer: Self-pay | Admitting: Cardiology

## 2020-06-25 DIAGNOSIS — Z4789 Encounter for other orthopedic aftercare: Secondary | ICD-10-CM | POA: Insufficient documentation

## 2020-06-26 ENCOUNTER — Other Ambulatory Visit (HOSPITAL_COMMUNITY): Payer: Self-pay | Admitting: Urology

## 2020-06-26 ENCOUNTER — Other Ambulatory Visit: Payer: Self-pay | Admitting: Urology

## 2020-06-26 DIAGNOSIS — N281 Cyst of kidney, acquired: Secondary | ICD-10-CM

## 2020-06-30 ENCOUNTER — Ambulatory Visit (INDEPENDENT_AMBULATORY_CARE_PROVIDER_SITE_OTHER): Payer: Medicare Other | Admitting: Physician Assistant

## 2020-06-30 ENCOUNTER — Encounter: Payer: Self-pay | Admitting: Physician Assistant

## 2020-06-30 ENCOUNTER — Other Ambulatory Visit: Payer: Self-pay

## 2020-06-30 VITALS — BP 99/59 | HR 70 | Ht 69.0 in | Wt 177.2 lb

## 2020-06-30 DIAGNOSIS — R5383 Other fatigue: Secondary | ICD-10-CM

## 2020-06-30 DIAGNOSIS — I10 Essential (primary) hypertension: Secondary | ICD-10-CM | POA: Diagnosis not present

## 2020-06-30 DIAGNOSIS — I2510 Atherosclerotic heart disease of native coronary artery without angina pectoris: Secondary | ICD-10-CM

## 2020-06-30 DIAGNOSIS — I2584 Coronary atherosclerosis due to calcified coronary lesion: Secondary | ICD-10-CM

## 2020-06-30 DIAGNOSIS — E785 Hyperlipidemia, unspecified: Secondary | ICD-10-CM | POA: Diagnosis not present

## 2020-06-30 MED ORDER — LISINOPRIL 10 MG PO TABS: 10.0000 mg | ORAL_TABLET | Freq: Every day | ORAL | 3 refills | Status: DC

## 2020-06-30 NOTE — Patient Instructions (Signed)
Medication Instructions:  DECREASE your lisinopril 10 mg daily *If you need a refill on your cardiac medications before your next appointment, please call your pharmacy*   Lab Work: Please have your PCP fax a copy of your lipid profile to our office If you have labs (blood work) drawn today and your tests are completely normal, you will receive your results only by: Marland Kitchen MyChart Message (if you have MyChart) OR . A paper copy in the mail If you have any lab test that is abnormal or we need to change your treatment, we will call you to review the results.   Testing/Procedures: None ordered   Follow-Up: At [facility], you and your health needs are our priority.  As part of our continuing mission to provide you with exceptional heart care, we have created designated Provider Care Teams.  These Care Teams include your primary Cardiologist (physician) and Advanced Practice Providers (APPs -  Physician Assistants and Nurse Practitioners) who all work together to provide you with the care you need, when you need it.  We recommend signing up for the patient portal called "MyChart".  Sign up information is provided on this After Visit Summary.  MyChart is used to connect with patients for Virtual Visits (Telemedicine).  Patients are able to view lab/test results, encounter notes, upcoming appointments, etc.  Non-urgent messages can be sent to your provider as well.   To learn more about what you can do with MyChart, go to [email or web address].    Your next appointment:   6 month(s)  The format for your next appointment:   In Person  Provider:   Peter Martinique, MD   Other Instructions None

## 2020-06-30 NOTE — Progress Notes (Signed)
Cardiology Office Note:    Date:  07/02/2020   ID:  Robert [name], DOB 23-Feb-1946, MRN [id number]  PCP:  [name], MD  [facility] Cardiologist:  Peter Martinique, MD  [place] Electrophysiologist:  None   Referring MD: [name], MD   Chief Complaint  Patient presents with  . Follow-up    seen for Dr. Martinique    History of Present Illness:    Robert [name] is a 74 y.o. male with a hx of coronary calcification, OSA on CPAP, HTN and HLD.  There was incidental finding of coronary calcification in mid RCA distribution.  Previous Myoview in June 2014 was negative for ischemia.  EF was normal on the Myoview.  He had history of facial flushing with crestor and pravastatin however able to tolerate Lipitor.  There was concern of bradycardia and fatigue in January 2018.  Toprol dose was reduced with improvement.  Heart monitor showed average heart rate of 64 with lowest heart rate 50.  EGD in June 2018 was normal.  He later developed heart palpitation and went back to prior Toprol-XL dose. He underwent parathyroidectomy for hyperparathyroidism.  Subsequent coronary CT showed minimal nonobstructive disease.  Due to hypotension, hydrochlorothiazide was discontinued.  Patient presents today for follow-up.  He denies any recent chest pain or shortness of breath.  He has been feeling fatigued recently, blood pressure is borderline low.  I will decrease lisinopril to 10 mg daily.  He has no lower extremity edema, lungs is clear on exam.  He had blood work drawn by his PCP which likely include fasting lipid panel.  He will need to send [place] a copy.  Previous lipid panel shows LDL of 105, if LDL increases, may consider increase the Lipitor to 80 mg daily.    Past Medical History:  Diagnosis Date  . Anxiety   . Arthritis    Knee , Neck  . Colon cancer [facility])    a. s/p colon resection in 2004 and 2002  . Complication of anesthesia    difficulty urinating after  . Edema of right foot     ? muscle problem to be addressed after left knee TKA on 01/06/2015 per patient   . History of kidney stones   . Hyperlipidemia   . Hypertension   . 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 Nocturia associated with benign prostatic hypertrophy 03/21/2013  . Palpitations   . S/P cardiac catheterization 1995 and 1998   a. following abnormal stress tests. Both reportedly normal;  b. 02/2013 nl myoview.  . Sleep apnea with use of continuous positive airway pressure (CPAP)    wears CPAP nightly    Past Surgical History:  Procedure Laterality Date  . CALCANEAL OSTEOTOMY Right 04/29/2016   Procedure: RIGHT CALCANEAL OSTEOTOMY, RIGHT MEDIAL CUNEIFORM OSTEOTOMY;  Surgeon: Wylene Simmer, MD;  Location: [place];  Service: Orthopedics;  Laterality: Right;  . CARDIAC CATHETERIZATION  07/04/1997  . COLON RESECTION  2004   x 2 per patient   . COLON SURGERY     colon resection  . COLONOSCOPY    . ELBOW SURGERY Left 2011   INFECTED OLECRANON BURSITIS SURGERY  . FOOT SURGERY Right   . GASTROCNEMIUS RECESSION Right 04/29/2016   Procedure: RIGHT GASTROC RECESSION;  Surgeon: Wylene Simmer, MD;  Location: [place];  Service: Orthopedics;  Laterality: Right;  . INGUINAL HERNIA REPAIR Right 06/03/2015   Procedure: REPAIR RIGHT INGUINAL HERNIA;  Surgeon: Lavone Neri  Grandville Silos, MD;  Location: [place];  Service: General;  Laterality: Right;  . INSERTION OF MESH Right 06/03/2015   Procedure: INSERTION OF MESH;  Surgeon: Georganna Skeans, MD;  Location: [place];  Service: General;  Laterality: Right;  . JOINT REPLACEMENT Left   . L4 DISCECTOMY    . PARATHYROIDECTOMY Left 03/16/2019   Procedure: LEFT PARATHYROIDECTOMY;  Surgeon: Armandina Gemma, MD;  Location: [facility];  Service: General;  Laterality: Left;  . [place]  . SEPTIC JOINT Left    Elbow  . TOTAL KNEE ARTHROPLASTY Left 01/06/2015   Procedure: LEFT TOTAL KNEE ARTHROPLASTY;  Surgeon: Gaynelle Arabian, MD;   Location: [facility];  Service: Orthopedics;  Laterality: Left;  . TOTAL KNEE ARTHROPLASTY Right 10/03/2015   Procedure: RIGHT TOTAL KNEE ARTHROPLASTY;  Surgeon: Gaynelle Arabian, MD;  Location: [facility];  Service: Orthopedics;  Laterality: Right;  . TRANSURETHRAL RESECTION OF PROSTATE      Current Medications: Current Meds  Medication Sig  . acetaminophen (TYLENOL) 500 MG tablet Take 500 mg by mouth at bedtime.  Marland Kitchen aspirin EC 81 MG tablet Take 81 mg daily by mouth.  Marland Kitchen atorvastatin (LIPITOR) 40 MG tablet TAKE 1 TABLET BY MOUTH EVERY DAY  . cholecalciferol (VITAMIN D3) 25 MCG (1000 UT) tablet Take 1,000 Units by mouth daily.  Marland Kitchen CIALIS 5 MG tablet Take 5 mg by mouth daily at 3 pm.   . diltiazem (CARDIZEM CD) 180 MG 24 hr capsule TAKE 1 CAPSULE (180 MG TOTAL) BY MOUTH AT BEDTIME.  . fexofenadine (ALLEGRA) 180 MG tablet Take 180 mg by mouth daily.  . fluticasone (FLONASE) 50 MCG/ACT nasal spray Place 1 spray into both nostrils daily.   Marland Kitchen ibuprofen (ADVIL,MOTRIN) 200 MG tablet Take 200 mg by mouth at bedtime.   . lidocaine (LMX) 4 % cream Apply 1 application topically at bedtime as needed (foot pain).  Marland Kitchen lisinopril (ZESTRIL) 10 MG tablet Take 1 tablet (10 mg total) by mouth daily.  Marland Kitchen LORazepam (ATIVAN) 1 MG tablet Take 0.5 mg by mouth 2 (two) times daily as needed for anxiety.  . metoprolol succinate (TOPROL-XL) 25 MG 24 hr tablet Take 1 tablet (25 mg total) by mouth daily.  . Multiple Vitamin (MULTIVITAMIN WITH MINERALS) TABS tablet Take 1 tablet by mouth daily.  . NON FORMULARY CPAP MACHINE  . Omega-3 Fatty Acids (FISH OIL) 1000 MG CPDR Take 1,000 mg by mouth daily.   . Probiotic Product (PROBIOTIC DAILY PO) Take 1 capsule by mouth daily.  . [DISCONTINUED] lisinopril (ZESTRIL) 20 MG tablet Take 1 tablet (20 mg total) by mouth daily.     Allergies:   Betadine [povidone iodine] and Oxycodone   Social History   Socioeconomic History  . Marital status: Married    Spouse name: [name]  . Number of  children: 3  . Years of education: Ph.D  . Highest education level: Not on file  Occupational History  . Occupation: ADMIN    Employer: [employer]    Comment: [employer]  Tobacco Use  . Smoking status: Never Smoker  . Smokeless tobacco: Never Used  Vaping Use  . Vaping Use: Never used  Substance and Sexual Activity  . Alcohol use: Yes    Alcohol/week: 17.0 standard drinks    Types: 8 Glasses of wine, 8 Shots of liquor, 1 Standard drinks or equivalent per week    Comment: each night 1 glass  . Drug use: No  . Sexual activity: Yes  Other Topics Concern  .  Not on file  Social History Narrative   Sleep apnea 28 AHI was reduced to 3.4- set to  7 cm water pressure on nasal mask eson or wisp.  He sleep  nonsupine, less nocturia.  Tennis ball method explained.  Patient does not want machine set to a certain pressure.     Downloaded CMS compliance s etsablsihed .  Insomnia not improved further - will try seroquel as  medication approach and discussed behaviour therapy approach .   send for n beahiour therapy and started trial of SEROQUEL>  25 minute visit including CMS compliance and downloading.    Patient is married [name]) and lives at home with his wife.   Patient has three children and his wife has three children.   Patient is working full-time.   Patient has a Ph.D.   Patient is right-handed.   Patient drinks about 21 oz of coffee daily.         Social Determinants of Health   Financial Resource Strain:   . Difficulty of Paying Living Expenses: Not on file  Food Insecurity:   . Worried About Charity fundraiser in the Last Year: Not on file  . Ran Out of Food in the Last Year: Not on file  Transportation Needs:   . Lack of Transportation (Medical): Not on file  . Lack of Transportation (Non-Medical): Not on file  Physical Activity:   . Days of Exercise per Week: Not on file  . Minutes of Exercise per Session: Not on file  Stress:   . Feeling of Stress : Not on  file  Social Connections:   . Frequency of Communication with Friends and Family: Not on file  . Frequency of Social Gatherings with Friends and Family: Not on file  . Attends Religious Services: Not on file  . Active Member of Clubs or Organizations: Not on file  . Attends Archivist Meetings: Not on file  . Marital Status: Not on file     Family History: The patient's family history includes Emphysema in his mother; Heart attack in his father; High Cholesterol in an other family member; Hypertension in his father and another family member.  ROS:   Please see the history of present illness.     All other systems reviewed and are negative.  EKGs/Labs/Other Studies Reviewed:    The following studies were reviewed today:  Coronary CT 11/01/2019 IMPRESSION: 1. Coronary calcium score of 25.7. This was 23rd percentile for age and sex matched control.  2. Mild ascending and descending thoracic aortic wall calcifications  3. Normal coronary origin with right dominance.  4.  Minimal non obstructive calcified plaque in the mid RCA and LAD  5. CAD-RADS 1. Minimal non-obstructive CAD (0-24%). Consider non-atherosclerotic causes of chest pain. Consider preventive therapy and risk factor modification.   EKG:  EKG is ordered today.  The ekg ordered today demonstrates normal sinus rhythm, no significant ST-T wave changes.  Recent Labs: 11/27/2019: ALT 24; BUN 19; Creatinine, Ser 1.06; Potassium 4.6; Sodium 137  Recent Lipid Panel    Component Value Date/Time   CHOL 177 11/27/2019 0819   TRIG 85 11/27/2019 0819   HDL 56 11/27/2019 0819   CHOLHDL 3.2 11/27/2019 0819   CHOLHDL 3.5 01/27/2017 0809   VLDL 34 (H) 01/27/2017 0809   LDLCALC 105 (H) 11/27/2019 0819    Physical Exam:    VS:  BP (!) 99/59   Pulse 70   Ht 5\' 9"  (1.753 m)  Wt 177 lb 3.2 oz (80.4 kg)   SpO2 98%   BMI 26.17 kg/m     Wt Readings from Last 3 Encounters:  06/30/20 177 lb 3.2 oz (80.4 kg)   12/03/19 175 lb 9.6 oz (79.7 kg)  09/26/19 176 lb 6.4 oz (80 kg)     GEN:  Well nourished, well developed in no acute distress HEENT: Normal NECK: No JVD; No carotid bruits LYMPHATICS: No lymphadenopathy CARDIAC: RRR, no murmurs, rubs, gallops RESPIRATORY:  Clear to auscultation without rales, wheezing or rhonchi  ABDOMEN: Soft, non-tender, non-distended MUSCULOSKELETAL:  No edema; No deformity  SKIN: Warm and dry NEUROLOGIC:  Alert and oriented x 3 PSYCHIATRIC:  Normal affect   ASSESSMENT:    1. Fatigue, unspecified type   2. Coronary artery calcification   3. Essential hypertension   4. Hyperlipidemia LDL goal <70    PLAN:    In order of problems listed above:  1. Fatigue: Blood pressure borderline hypotensive today.  We will decrease his lisinopril to 10 mg daily.  2. Coronary artery calcification: Previous coronary CT showed minimal disease  3. Hypertension: Blood pressure borderline low today, will decrease lisinopril to 10 mg daily  4. Hyperlipidemia: He recently had blood work drawn at his PCPs office, I recommended increase Lipitor if LDL is still elevated.   Medication Adjustments/Labs and Tests Ordered: Current medicines are reviewed at length with the patient today.  Concerns regarding medicines are outlined above.  No orders of the defined types were placed in this encounter.  Meds ordered this encounter  Medications  . lisinopril (ZESTRIL) 10 MG tablet    Sig: Take 1 tablet (10 mg total) by mouth daily.    Dispense:  90 tablet    Refill:  3    Patient Instructions  Medication Instructions:  DECREASE your lisinopril 10 mg daily *If you need a refill on your cardiac medications before your next appointment, please call your pharmacy*   Lab Work: Please have your PCP fax a copy of your lipid profile to our office If you have labs (blood work) drawn today and your tests are completely normal, you will receive your results only by: Marland Kitchen MyChart Message  (if you have MyChart) OR . A paper copy in the mail If you have any lab test that is abnormal or we need to change your treatment, we will call you to review the results.   Testing/Procedures: None ordered   Follow-Up: At [facility], you and your health needs are our priority.  As part of our continuing mission to provide you with exceptional heart care, we have created designated Provider Care Teams.  These Care Teams include your primary Cardiologist (physician) and Advanced Practice Providers (APPs -  Physician Assistants and Nurse Practitioners) who all work together to provide you with the care you need, when you need it.  We recommend signing up for the patient portal called "MyChart".  Sign up information is provided on this After Visit Summary.  MyChart is used to connect with patients for Virtual Visits (Telemedicine).  Patients are able to view lab/test results, encounter notes, upcoming appointments, etc.  Non-urgent messages can be sent to your provider as well.   To learn more about what you can do with MyChart, go to [email or web address].    Your next appointment:   6 month(s)  The format for your next appointment:   In Person  Provider:   Peter Martinique, MD   Other Instructions None  Hilbert Corrigan, [place]  07/02/2020 7:04 PM    [facility]

## 2020-07-02 ENCOUNTER — Ambulatory Visit (HOSPITAL_COMMUNITY)
Admission: RE | Admit: 2020-07-02 | Discharge: 2020-07-02 | Disposition: A | Discharge status: Home / Self Care | Payer: Medicare Other | Source: Ambulatory Visit | Attending: Cardiology | Admitting: Cardiology

## 2020-07-02 ENCOUNTER — Ambulatory Visit (HOSPITAL_COMMUNITY): Admission: RE | Admit: 2020-07-02 | Payer: Medicare Other | Source: Ambulatory Visit

## 2020-07-02 ENCOUNTER — Other Ambulatory Visit: Payer: Self-pay

## 2020-07-02 ENCOUNTER — Encounter (HOSPITAL_COMMUNITY): Payer: Self-pay

## 2020-07-02 ENCOUNTER — Encounter: Payer: Self-pay | Admitting: Physician Assistant

## 2020-07-02 ENCOUNTER — Other Ambulatory Visit (HOSPITAL_COMMUNITY): Payer: Self-pay | Admitting: Physician Assistant

## 2020-07-02 DIAGNOSIS — M79601 Pain in right arm: Secondary | ICD-10-CM

## 2020-07-02 DIAGNOSIS — M79603 Pain in arm, unspecified: Secondary | ICD-10-CM | POA: Insufficient documentation

## 2020-07-03 NOTE — Addendum Note (Signed)
Addended by: Wonda Horner on: 07/03/2020 12:26 PM   Modules accepted: Orders

## 2020-07-14 ENCOUNTER — Other Ambulatory Visit: Payer: Self-pay

## 2020-07-14 ENCOUNTER — Ambulatory Visit (HOSPITAL_COMMUNITY)
Admission: RE | Admit: 2020-07-14 | Discharge: 2020-07-14 | Disposition: A | Discharge status: Home / Self Care | Payer: Medicare Other | Source: Ambulatory Visit | Attending: Urology | Admitting: Urology

## 2020-07-14 DIAGNOSIS — N281 Cyst of kidney, acquired: Secondary | ICD-10-CM | POA: Diagnosis present

## 2020-07-14 MED ORDER — GADOBUTROL 1 MMOL/ML IV SOLN
8.0000 mL | Freq: Once | INTRAVENOUS | Status: AC | PRN
  Administered 2020-07-14: 8 mL via INTRAVENOUS

## 2020-09-11 ENCOUNTER — Encounter: Payer: Self-pay | Admitting: Neurology

## 2020-09-11 ENCOUNTER — Ambulatory Visit: Payer: Medicare Other | Admitting: Neurology

## 2020-09-11 VITALS — BP 111/60 | HR 58 | Ht 69.0 in | Wt 178.0 lb

## 2020-09-11 DIAGNOSIS — G4733 Obstructive sleep apnea (adult) (pediatric): Secondary | ICD-10-CM | POA: Diagnosis not present

## 2020-09-11 DIAGNOSIS — Z9989 Dependence on other enabling machines and devices: Secondary | ICD-10-CM

## 2020-09-11 DIAGNOSIS — R351 Nocturia: Secondary | ICD-10-CM | POA: Diagnosis not present

## 2020-09-11 MED ORDER — LORAZEPAM 1 MG PO TABS
0.5000 mg | ORAL_TABLET | Freq: Two times a day (BID) | ORAL | 2 refills | Status: DC | PRN
Start: 2020-09-11 — End: 2021-02-02

## 2020-09-11 NOTE — Patient Instructions (Signed)

## 2020-09-11 NOTE — Progress Notes (Signed)
[facility]   SLEEP MEDICINE CLINIC   Provider:  Dr. Asencion [name] Robert [name] Referring Provider: [name], MD Primary Care Physician:  Robert Redwood, MD  Chief Complaint  Patient presents with  . Follow-up    Pt alone, rm 10. Presents today for yearly f/u. States [name] things are well. No issues or concerns. DME [employer]     HPI:  Robert [name] is a 74 y.o. male here as a revisit for Insomnia and OSA , referred originally by Dr. Jasmine [name] , Urologist.  Dr. Brigitte [name] is this patient's PCP. Dr Robert [name] is his oncologist.  Robert [name] begun having fragmented sleep and finally  would wake up 5-7 times at night to relieve himself. He tried Flomax without success and then by the recently changed urologists and 2013. Dr. Jasmine [name] finally suspected that sleep apnea may be a contributing cause. He also had seen a dentist about the same time noticed him falling asleep in the dental chair and urgent to get an workup for obstructive sleep apnea. Dr. Brigitte [name], his primary care physician ordered a sleep study and the patient tested positive for apnea he was titrated to CPAP in a split night procedure the study was performed. The split night procedure took place on 08-16-12 the patient's BMI at that time was 25 his Epworth sleepiness score 4/24 points his neck circumference 15-1/2 inches. He and was to exhalatory at one point. Apnea hypotony index was 18.5 and RDI 18.9 there was no strong REM complement the supine complement. Lowest desaturation was 78% supine REM sleep was 32.8 minutes of desaturation. The patient was titrated to 6 cm water pressure as an AHI of less than 5. He then underwent a  Auto- titration and compliance report through the [name] 2013 into January 2040,  he used his machine 6 hours 30 minutes at night was 100% compliant , his  AHI  was 2.9 in late January 2014 .Patient uses CPAP at 4 to 8 cm water autoset with a residual AHI of 1.7 and average user time 5 hours 27  minutes. Low air leak, 90 days of 93% compliance.He still has nocturia,  TURP procedure 1995, Epworth 5 and FSS 30 points, Patient has found a 'cocktail " that helps him to sleep , using an antihistamine along with prescription sleep aid.   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  He will bring her CPAP machine [facility]. He has been having nightmares on unisom. He is treated with cialis for BPH(!). He is 80% better in terms of Nocturia.  He was on a [place] and woke up 3 times at night, but this was better than at home, and he thinks it was due to his higher level of physicial activity. Lorazepam is used PRN.  I would feel unconcerned about him using a benzodiazepine for the rest of his life.  Interval history from 04/20/2016. With the couple of just returned from his daughter's wedding, with a very exciting event he is 97% compliance with CPAP use uses an AutoSet between 4 and 8 cm water with 27 m EPR his residual AHI is close to ideal at 1.4 per hour. No adjustments need to be made. He has been losing weight , plans to retire this Friday.  He tore his right posterior tibial tendon and plans to undergo surgery.   Patient seen here on 09-12-2019, meanwhile 74 year old-  He sleeps best on sialis, now generic  and CPAP, having reduced his nocturia to 2 times from 5-6 times. He can some nights sleep 5 hours straight.  He enjoyed wine, taking [place], and [employer] cruises, enjoys these tremendously.  He underwent bilateral knee replacement time deferred, and is now walking 5 miles each day pain-free.  He has been able to control weight, hypertension.  His new CPAP machine is an air sense 10 AutoSet between 4 and 8 cmH2O with 1 cm water pressure expiratory relief he has used the machine 29 out of 30 days with over 4 hours with an average user time of 7 5 hours 7 minutes.  Residual AHI is 1.0 apneas per hour which is  physiologically normal the 95th percentile pressure is 7.4 he has mild air leakage, he is not excessively daytime sleepy, he would like to donate his S9 AutoSet machine which she on before with similar settings we will for now and followed by modem the serial number machine [id number].   09-11-2020: Robert [name], his new [employer] ADAPT- will replace his nasal pillow every 14 days. He will inquire in his pharmacy about a petroleum free ointment for his nose.  Less Airleak, less urinary frequency with Sialis and 0.5 mg lorazepam helps to sleep , replaced the benadryl-  And it's anticholinergic side effects in an aging patient. [place].  - exercises.  He gets about 6 hours of sleep - much better than before we started his journey. 100 % compliance. Residual AHI 1.4/h and a 95% air leak of 19.5 L/minutes. Auto setting from 4-8 cm with 1 cm EPR-  He plays banjo- will take a break after elbow surgery on his right elbow, currently in a brace.     Review of Systems: Out of a complete 14 system review, the patient complains of only the following symptoms, and all other reviewed systems are negative. Nocturia  is down  from 6-7 times at night to 3 times.   How likely are you to doze in the following situations: 0 = not likely, 1 = slight chance, 2 = moderate chance, 3 = high chance  Sitting and Reading? Watching Television? Sitting inactive in a public place (theater or meeting)? Lying down in the afternoon when circumstances permit? Sitting and talking to someone? Sitting quietly after lunch without alcohol? In a car, while stopped for a few minutes in traffic? As a passenger in a car for an hour without a break?  Total = 5   Epworth Sleepiness Scale today is score today is 5/ 24  points, and  FSS 22 points, and the GDS depression score remains zero  Points.  No longer nightmares , reduced  Nocturia- but not resolved .    Dicylomine as sleep aid permitted, takes Malatonin.  Social History   Socioeconomic History  . Marital status: Married    Spouse name: [name]  . Number of children: 3  . Years of education: Ph.D  . Highest education level: Not on file  Occupational History  . Occupation: ADMIN    Employer: [employer]    Comment: [employer]  Tobacco Use  . Smoking status: Never Smoker  . Smokeless tobacco: Never Used  Vaping Use  . Vaping Use: Never used  Substance and Sexual Activity  . Alcohol use: Yes    Alcohol/week: 17.0 standard drinks    Types: 8 Glasses of wine, 8 Shots of  liquor, 1 Standard drinks or equivalent per week    Comment: each night 1 glass  . Drug use: No  . Sexual activity: Yes  Other Topics Concern  . Not on file  Social History Narrative   Sleep apnea 28 AHI was reduced to 3.4- set to  7 cm water pressure on nasal mask eson or wisp.  He sleep  nonsupine, less nocturia.  Tennis ball method explained.  Patient does not want machine set to a certain pressure.     Downloaded CMS compliance s etsablsihed .  Insomnia not improved further - will try seroquel as  medication approach and discussed behaviour therapy approach .   send for n beahiour therapy and started trial of SEROQUEL>  25 minute visit including CMS compliance and downloading.    Patient is married [name]) and lives at home with his wife.   Patient has three children and his wife has three children.   Patient is working full-time.   Patient has a Ph.D.   Patient is right-handed.   Patient drinks about 21 oz of coffee daily.         Social Determinants of Health   Financial Resource Strain: Not on file  Food Insecurity: Not on file  Transportation Needs: Not on file  Physical Activity: Not on file  Stress: Not on file  Social Connections: Not on file  Intimate Partner Violence: Not on file    Family History  Problem Relation Age of Onset  . Emphysema  Mother   . Heart attack Father   . Hypertension Father   . Hypertension Other   . High Cholesterol Other     Past Medical History:  Diagnosis Date  . Anxiety   . Arthritis    Knee , Neck  . Colon cancer [facility])    a. s/p colon resection in 2004 and 2002  . Complication of anesthesia    difficulty urinating after  . Edema of right foot    ? muscle problem to be addressed after left knee TKA on 01/06/2015 per patient   . History of kidney stones   . Hyperlipidemia   . Hypertension   . 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 Nocturia associated with benign prostatic hypertrophy 03/21/2013  . Palpitations   . S/P cardiac catheterization 1995 and 1998   a. following abnormal stress tests. Both reportedly normal;  b. 02/2013 nl myoview.  . Sleep apnea with use of continuous positive airway pressure (CPAP)    wears CPAP nightly    Past Surgical History:  Procedure Laterality Date  . CALCANEAL OSTEOTOMY Right 04/29/2016   Procedure: RIGHT CALCANEAL OSTEOTOMY, RIGHT MEDIAL CUNEIFORM OSTEOTOMY;  Surgeon: Wylene Simmer, MD;  Location: [place];  Service: Orthopedics;  Laterality: Right;  . CARDIAC CATHETERIZATION  07/04/1997  . COLON RESECTION  2004   x 2 per patient   . COLON SURGERY     colon resection  . COLONOSCOPY    . ELBOW SURGERY Left 2011   INFECTED OLECRANON BURSITIS SURGERY  . FOOT SURGERY Right   . GASTROCNEMIUS RECESSION Right 04/29/2016   Procedure: RIGHT GASTROC RECESSION;  Surgeon: Wylene Simmer, MD;  Location: [place];  Service: Orthopedics;  Laterality: Right;  . INGUINAL HERNIA REPAIR Right 06/03/2015   Procedure: REPAIR RIGHT INGUINAL HERNIA;  Surgeon: Georganna Skeans, MD;  Location: [place];  Service: General;  Laterality: Right;  . INSERTION OF MESH Right 06/03/2015   Procedure: INSERTION OF  MESH;  Surgeon: Georganna Skeans, MD;  Location: [place];  Service: General;  Laterality: Right;  . JOINT REPLACEMENT Left   . L4 DISCECTOMY     . PARATHYROIDECTOMY Left 03/16/2019   Procedure: LEFT PARATHYROIDECTOMY;  Surgeon: Armandina Gemma, MD;  Location: [facility];  Service: General;  Laterality: Left;  . [place]  . SEPTIC JOINT Left    [place]  . TOTAL KNEE ARTHROPLASTY Left 01/06/2015   Procedure: LEFT TOTAL KNEE ARTHROPLASTY;  Surgeon: Gaynelle Arabian, MD;  Location: [facility];  Service: Orthopedics;  Laterality: Left;  . TOTAL KNEE ARTHROPLASTY Right 10/03/2015   Procedure: RIGHT TOTAL KNEE ARTHROPLASTY;  Surgeon: Gaynelle Arabian, MD;  Location: [facility];  Service: Orthopedics;  Laterality: Right;  . TRANSURETHRAL RESECTION OF PROSTATE      Current Outpatient Medications  Medication Sig Dispense Refill  . acetaminophen (TYLENOL) 500 MG tablet Take 500 mg by mouth at bedtime.    Marland Kitchen atorvastatin (LIPITOR) 40 MG tablet TAKE 1 TABLET BY MOUTH EVERY DAY 90 tablet 2  . cholecalciferol (VITAMIN D3) 25 MCG (1000 UT) tablet Take 1,000 Units by mouth daily.    Marland Kitchen CIALIS 5 MG tablet Take 5 mg by mouth daily at 3 pm.     . diltiazem (CARDIZEM CD) 180 MG 24 hr capsule TAKE 1 CAPSULE (180 MG TOTAL) BY MOUTH AT BEDTIME. 90 capsule 2  . fexofenadine (ALLEGRA) 180 MG tablet Take 180 mg by mouth daily.    . fluticasone (FLONASE) 50 MCG/ACT nasal spray Place 1 spray into both nostrils daily.     Marland Kitchen ibuprofen (ADVIL,MOTRIN) 200 MG tablet Take 200 mg by mouth at bedtime.     . lidocaine (LMX) 4 % cream Apply 1 application topically at bedtime as needed (foot pain).    Marland Kitchen lisinopril (ZESTRIL) 10 MG tablet Take 1 tablet (10 mg total) by mouth daily. 90 tablet 3  . LORazepam (ATIVAN) 1 MG tablet Take 0.5 mg by mouth 2 (two) times daily as needed for anxiety.    . metoprolol succinate (TOPROL-XL) 25 MG 24 hr tablet Take 1 tablet (25 mg total) by mouth daily. 180 tablet 3  . Multiple Vitamin (MULTIVITAMIN WITH MINERALS) TABS tablet Take 1 tablet by mouth daily.    . NON FORMULARY CPAP MACHINE    . Omega-3 Fatty Acids (FISH OIL) 1000 MG CPDR Take  1,000 mg by mouth daily.     . Probiotic Product (PROBIOTIC DAILY PO) Take 1 capsule by mouth daily.     No current facility-administered medications for this visit.    Allergies as of 09/11/2020 - Review Complete 09/11/2020  Allergen Reaction Noted  . Betadine [povidone iodine] Other (See Comments) 12/31/2014  . Oxycodone Other (See Comments) 10/03/2015    Vitals: BP 111/60   [name] (!) 58   Ht 5\' 9"  (1.753 m)   Wt 178 lb (80.7 kg)   BMI 26.29 kg/m  Last Weight:  Wt Readings from Last 1 Encounters:  09/11/20 178 lb (80.7 kg)   Last Height:   Ht Readings from Last 1 Encounters:  09/11/20 5\' 9"  (1.753 m)   Physical exam:  General: The patient is awake, alert and appears not in acute distress. The patient is well groomed. Head: Normocephalic, atraumatic. Neck is supple. Mallampati 2 , neck circumference:16. No retrognathia.  Cardiovascular:  Regular rate and rhythm , without  murmurs or carotid bruit, and without distended neck veins. Respiratory: Lungs are clear to auscultation. Skin:  Without evidence of  edema, or rash Trunk: BMI is  normal posture.  Neurologic exam : The patient is awake and alert, oriented to place and time. Speech is fluent without  dysarthria, dysphonia or aphasia. Mood and affect are appropriate. Cranial nerves:  No loss of smell or taste reported. Pupils are equal and briskly reactive to light.  Extraocular movements without nystagmus. Visual fields by finger perimetry are intact. Hearing to finger rub intact.   Facial sensation intact to fine touch.  Facial motor strength is symmetric and tongue and uvula move midline. Motor exam:   Normal tone and normal muscle bulk . Sensory:  Fine touch, pinprick and vibration were tested in all extremities. Proprioception is  normal.   Assessment:  After physical and neurologic examination, review of laboratory studies, imaging, neurophysiology testing and pre-existing records, assessment :  OSA is best  controlled, there is no further improvement likely.  He shall continue to use CPAP ,auto titration 4-8 cm water, 1 cm EPR.Marland Kitchen  He has had maximum benefit of apnea treatment on nocturia, insomnia.   I will follow yearly- in  person.      Larey Seat, MD  09-11-2020

## 2020-10-20 MED ORDER — EZETIMIBE 10 MG PO TABS: 10.0000 mg | ORAL_TABLET | Freq: Every day | ORAL | 3 refills | Status: DC

## 2020-11-07 ENCOUNTER — Other Ambulatory Visit: Payer: Self-pay

## 2020-11-07 ENCOUNTER — Telehealth: Payer: Self-pay

## 2020-11-07 NOTE — Telephone Encounter (Signed)
We will get a copy of lab work from Dr [name]. Can add Zetia to med list. If labs just done by Dr [name] then we will not need to do.  Robert Helseth [place] MD, [facility]

## 2020-11-07 NOTE — Telephone Encounter (Signed)
Recent lab requested from PCP Dr.Shaw's office.

## 2020-11-11 ENCOUNTER — Ambulatory Visit (HOSPITAL_COMMUNITY)
Admission: RE | Admit: 2020-11-11 | Discharge: 2020-11-11 | Disposition: A | Discharge status: Home / Self Care | Payer: Medicare Other | Source: Ambulatory Visit | Attending: Sports Medicine | Admitting: Sports Medicine

## 2020-11-11 ENCOUNTER — Other Ambulatory Visit: Payer: Self-pay

## 2020-11-11 ENCOUNTER — Other Ambulatory Visit (HOSPITAL_COMMUNITY): Payer: Self-pay | Admitting: Sports Medicine

## 2020-11-11 DIAGNOSIS — M79661 Pain in right lower leg: Secondary | ICD-10-CM

## 2020-11-12 ENCOUNTER — Encounter (HOSPITAL_COMMUNITY): Payer: Self-pay | Admitting: Sports Medicine

## 2020-12-16 DIAGNOSIS — M79645 Pain in left finger(s): Secondary | ICD-10-CM | POA: Insufficient documentation

## 2020-12-25 ENCOUNTER — Other Ambulatory Visit: Payer: Self-pay | Admitting: Cardiology

## 2020-12-25 DIAGNOSIS — R002 Palpitations: Secondary | ICD-10-CM

## 2021-01-13 MED ORDER — LISINOPRIL 10 MG PO TABS: 20.0000 mg | ORAL_TABLET | Freq: Every day | ORAL | 3 refills | Status: DC

## 2021-01-19 ENCOUNTER — Other Ambulatory Visit: Payer: Self-pay

## 2021-01-19 DIAGNOSIS — I10 Essential (primary) hypertension: Secondary | ICD-10-CM

## 2021-01-19 DIAGNOSIS — E785 Hyperlipidemia, unspecified: Secondary | ICD-10-CM

## 2021-01-19 DIAGNOSIS — E7800 Pure hypercholesterolemia, unspecified: Secondary | ICD-10-CM

## 2021-01-19 DIAGNOSIS — I2510 Atherosclerotic heart disease of native coronary artery without angina pectoris: Secondary | ICD-10-CM

## 2021-01-20 LAB — HEPATIC FUNCTION PANEL
ALT: 42 IU/L (ref 0–44)
AST: 28 IU/L (ref 0–40)
Albumin: 4.4 g/dL (ref 3.7–4.7)
Alkaline Phosphatase: 88 IU/L (ref 44–121)
Bilirubin Total: 0.5 mg/dL (ref 0.0–1.2)
Bilirubin, Direct: 0.16 mg/dL (ref 0.00–0.40)
Total Protein: 6.3 g/dL (ref 6.0–8.5)

## 2021-01-20 LAB — BASIC METABOLIC PANEL
BUN/Creatinine Ratio: 24 (ref 10–24)
BUN: 24 mg/dL (ref 8–27)
CO2: 23 mmol/L (ref 20–29)
Calcium: 9.3 mg/dL (ref 8.6–10.2)
Chloride: 103 mmol/L (ref 96–106)
Creatinine, Ser: 0.98 mg/dL (ref 0.76–1.27)
Glucose: 93 mg/dL (ref 65–99)
Potassium: 4.4 mmol/L (ref 3.5–5.2)
Sodium: 140 mmol/L (ref 134–144)
eGFR: 81 mL/min/{1.73_m2} (ref >59)

## 2021-01-20 LAB — LIPID PANEL
Chol/HDL Ratio: 2.8 ratio (ref 0.0–5.0)
Cholesterol, Total: 146 mg/dL (ref 100–199)
HDL: 53 mg/dL (ref >39)
LDL Chol Calc (NIH): 73 mg/dL (ref 0–99)
Triglycerides: 109 mg/dL (ref 0–149)
VLDL Cholesterol Cal: 20 mg/dL (ref 5–40)

## 2021-01-26 ENCOUNTER — Ambulatory Visit: Payer: Medicare Other | Admitting: Cardiology

## 2021-01-26 NOTE — Progress Notes (Deleted)
Cardiology Office Note:    Date:  01/26/2021   ID:  Robert [name], DOB April 05, 1946, MRN [id number]  PCP:  Ginger Organ., MD  Cardiologist:  Aaylah Pokorny Martinique, MD  Electrophysiologist:  None   Referring MD: Ginger Organ., MD   No chief complaint on file.   History of Present Illness:    Robert [name] is a 75 y.o. male with a hx of coronary calcification, OSA on CPAP, HTN and HLD.  There was incidental finding of coronary calcification in mid RCA distribution.  Previous Myoview in June 2014 was negative for ischemia.  EF was normal on the Myoview.  He had history of facial flushing with crestor and pravastatin however able to tolerate Lipitor.  There was concern of bradycardia and fatigue in January 2018.  Toprol dose was reduced with improvement.  His monitor showed average heart rate of 64 with lowest heart rate 50.  EGD in June 2018 was normal.  He later developed her palpitation and went back to prior Toprol-XL dose.    He did undergo parathyroidectomy for hyperparathyroidism.  He was seen in December with symptoms of chest pain/pressure.  He was diagnosed with Covid in November  however did not have any symptoms at all.  Subsequent nucleic acid study was negative 5 days later.  Therefore he is not sure if he truly had Covid. He was scheduled for coronary CTA. This showed minimal nonobstructive CAD. Later he went to give blood and had  low BP readings of 100/50 and 98/48 and we recommended stopping HCTZ.  He notes that with the reduction in medication his face feels more ruddy. BP at home 130/80. He feels well. No chest pain. Eats a very heart healthy diet.     Past Medical History:  Diagnosis Date  . Anxiety   . Arthritis    Knee , Neck  . Colon cancer [facility])    a. s/p colon resection in 2004 and 2002  . Complication of anesthesia    difficulty urinating after  . Edema of right foot    ? muscle problem to be addressed after left knee TKA on 01/06/2015 per patient   .  History of kidney stones   . Hyperlipidemia   . Hypertension   . 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 Nocturia associated with benign prostatic hypertrophy 03/21/2013  . Palpitations   . S/P cardiac catheterization 1995 and 1998   a. following abnormal stress tests. Both reportedly normal;  b. 02/2013 nl myoview.  . Sleep apnea with use of continuous positive airway pressure (CPAP)    wears CPAP nightly    Past Surgical History:  Procedure Laterality Date  . CALCANEAL OSTEOTOMY Right 04/29/2016   Procedure: RIGHT CALCANEAL OSTEOTOMY, RIGHT MEDIAL CUNEIFORM OSTEOTOMY;  Surgeon: Wylene Simmer, MD;  Location: [place];  Service: Orthopedics;  Laterality: Right;  . CARDIAC CATHETERIZATION  07/04/1997  . COLON RESECTION  2004   x 2 per patient   . COLON SURGERY     colon resection  . COLONOSCOPY    . ELBOW SURGERY Left 2011   INFECTED OLECRANON BURSITIS SURGERY  . FOOT SURGERY Right   . GASTROCNEMIUS RECESSION Right 04/29/2016   Procedure: RIGHT GASTROC RECESSION;  Surgeon: Wylene Simmer, MD;  Location: [place];  Service: Orthopedics;  Laterality: Right;  . INGUINAL HERNIA REPAIR Right 06/03/2015   Procedure: REPAIR RIGHT INGUINAL HERNIA;  Surgeon: Georganna Skeans, MD;  Location[facility]  OR;  Service: General;  Laterality: Right;  . INSERTION OF MESH Right 06/03/2015   Procedure: INSERTION OF MESH;  Surgeon: Georganna Skeans, MD;  Location: [place];  Service: General;  Laterality: Right;  . JOINT REPLACEMENT Left   . L4 DISCECTOMY    . PARATHYROIDECTOMY Left 03/16/2019   Procedure: LEFT PARATHYROIDECTOMY;  Surgeon: Armandina Gemma, MD;  Location: [facility];  Service: General;  Laterality: Left;  . [place]  . SEPTIC JOINT Left    Elbow  . TOTAL KNEE ARTHROPLASTY Left 01/06/2015   Procedure: LEFT TOTAL KNEE ARTHROPLASTY;  Surgeon: Gaynelle Arabian, MD;  Location: [facility];  Service: Orthopedics;  Laterality: Left;  . TOTAL KNEE ARTHROPLASTY  Right 10/03/2015   Procedure: RIGHT TOTAL KNEE ARTHROPLASTY;  Surgeon: Gaynelle Arabian, MD;  Location: [facility];  Service: Orthopedics;  Laterality: Right;  . TRANSURETHRAL RESECTION OF PROSTATE      Current Medications: No outpatient medications have been marked as taking for the 01/29/21 encounter (Appointment) with [place], Darnella Zeiter M, MD.     Allergies:   Betadine [povidone iodine] and Oxycodone   Social History   Socioeconomic History  . Marital status: Married    Spouse name: [name]  . Number of children: 3  . Years of education: Ph.D  . Highest education level: Not on file  Occupational History  . Occupation: ADMIN    Employer: [employer]    Comment: [employer]  Tobacco Use  . Smoking status: Never Smoker  . Smokeless tobacco: Never Used  Vaping Use  . Vaping Use: Never used  Substance and Sexual Activity  . Alcohol use: Yes    Alcohol/week: 17.0 standard drinks    Types: 8 Glasses of wine, 8 Shots of liquor, 1 Standard drinks or equivalent per week    Comment: each night 1 glass  . Drug use: No  . Sexual activity: Yes  Other Topics Concern  . Not on file  Social History Narrative   Sleep apnea 28 AHI was reduced to 3.4- set to  7 cm water pressure on nasal mask eson or wisp.  He sleep  nonsupine, less nocturia.  Tennis ball method explained.  Patient does not want machine set to a certain pressure.     Downloaded CMS compliance s etsablsihed .  Insomnia not improved further - will try seroquel as  medication approach and discussed behaviour therapy approach .   send for n beahiour therapy and started trial of SEROQUEL>  25 minute visit including CMS compliance and downloading.    Patient is married [name]) and lives at home with his wife.   Patient has three children and his wife has three children.   Patient is working full-time.   Patient has a Ph.D.   Patient is right-handed.   Patient drinks about 21 oz of coffee daily.         Social Determinants of  Health   Financial Resource Strain: Not on file  Food Insecurity: Not on file  Transportation Needs: Not on file  Physical Activity: Not on file  Stress: Not on file  Social Connections: Not on file     Family History: The patient's family history includes Emphysema in his mother; Heart attack in his father; High Cholesterol in an other family member; Hypertension in his father and another family member.  ROS:   Please see the history of present illness.     All other systems reviewed and are negative.  EKGs/Labs/Other Studies Reviewed:  The following studies were reviewed today:  ETT 03/15/2017 Study Highlights   Upsloping ST segment depression ST segment depression was noted during stress in the II, III, aVF, V3, V4 and V5 leads, and returning to baseline after less than 1 minute of recovery.  No T wave inversion was noted during stress.  Negative, adequate stress test.  Excellent exercise capacity.  Blood pressure demonstrated a normal response to exercise   Coronary CTA 11/18/19:  OVER-READ INTERPRETATION  CT CHEST  The following report is an over-read performed by radiologist Dr. Norlene Duel [facility] on 11/02/2019. This over-read does not include interpretation of cardiac or coronary anatomy or pathology. The coronary CTA interpretation by the cardiologist is attached.  COMPARISON:  None.  FINDINGS: Cardiovascular: Mild cardiac enlargement. No pericardial effusion. Mild aortic atherosclerosis identified.  Mediastinum/nodes: No mass or adenopathy identified.  Lungs/pleura: No pleural effusion or airspace consolidation. Dependent changes including increased ground-glass attenuation and interstitial thickening identified in the posterior lung bases.  Upper abdomen: No acute abnormality.  Musculoskeletal: No acute or significant osseous findings.  IMPRESSION: 1. No significant noncardiac findings identified. Dependent  type changes noted within both lung bases. 2.  Aortic Atherosclerosis (ICD10-I70.0).   Electronically Signed   By: Kerby Moors M.D.   On: 11/02/2019 08:35   Addended by Kerby Moors, MD on 11/02/2019 8:37 AM    Study Result  CLINICAL DATA:  Hx of htn, hld obesity, abnormal ETT  EXAM: Cardiac/Coronary  CTA  TECHNIQUE: The patient was scanned on a Siemens Somatoform go.Top scanner.  FINDINGS: A retrospective scan was triggered in the descending thoracic aorta. Axial non-contrast 3 mm slices were carried out through the heart. The data set was analyzed on a dedicated work station and scored using the [place]. Gantry rotation speed was 330 msecs and collimation was .6 mm. 50mg  of metoprolol and 0.8 mg of sl NTG was given. The 3D data set was reconstructed in 5% intervals of the 60-95 % of the R-R cycle. Diastolic phases were analyzed on a dedicated work station using MPR, MIP and VRT modes. The patient received 75 cc of contrast.  Aorta: Normal size. Mild aortic wall calcifications noted in the ascending and descending thoracic segment. No dissection.  Aortic Valve:  Trileaflet.  No calcifications.  Coronary Arteries:  Normal coronary origin.  Right dominance.  RCA is a large dominant artery that gives rise to PDA and PLA. There is minimal calcified plaque in the mid RCA causing non obstructive disease (0-24%).  Left main is a large artery that gives rise to LAD and LCX arteries.  LAD is a large vessel that has mild calcified plaque the mid segment causing minimal nonobstructive disease (0-24%).  LCX is a non-dominant artery that gives rise to 2 obtuse marginal branches. There is no plaque.  Other findings:  Normal pulmonary vein drainage into the left atrium.  Normal left atrial appendage without a thrombus.  Normal size of the pulmonary artery.  IMPRESSION: 1. Coronary calcium score of 25.7. This was 23rd percentile for age and  sex matched control.  2. Mild ascending and descending thoracic aortic wall calcifications  3. Normal coronary origin with right dominance.  4.  Minimal non obstructive calcified plaque in the mid RCA and LAD  5. CAD-RADS 1. Minimal non-obstructive CAD (0-24%). Consider non-atherosclerotic causes of chest pain. Consider preventive therapy and risk factor modification.  Electronically Signed: By: Kate Sable M.D. On: 11/01/2019 15:47       EKG:  EKG is not ordered today.    Recent Labs: 01/20/2021: ALT 42; BUN 24; Creatinine, Ser 0.98; Potassium 4.4; Sodium 140  Recent Lipid Panel    Component Value Date/Time   CHOL 146 01/20/2021 0916   TRIG 109 01/20/2021 0916   HDL 53 01/20/2021 0916   CHOLHDL 2.8 01/20/2021 0916   CHOLHDL 3.5 01/27/2017 0809   VLDL 34 (H) 01/27/2017 0809   LDLCALC 73 01/20/2021 0916   Dated 07/17/19: cholesterol 183, triglycerides 84, HDL 54, LDL 112   Physical Exam:    VS:  There were no vitals taken for this visit.    Wt Readings from Last 3 Encounters:  09/11/20 178 lb (80.7 kg)  06/30/20 177 lb 3.2 oz (80.4 kg)  12/03/19 175 lb 9.6 oz (79.7 kg)     GEN:  Well nourished, well developed in no acute distress HEENT: Normal NECK: No JVD; No carotid bruits LYMPHATICS: No lymphadenopathy CARDIAC: RRR, no murmurs, rubs, gallops RESPIRATORY:  Clear to auscultation without rales, wheezing or rhonchi  ABDOMEN: Soft, non-tender, non-distended MUSCULOSKELETAL:  No edema; No deformity  SKIN: Warm and dry NEUROLOGIC:  Alert and oriented x 3 PSYCHIATRIC:  Normal affect   ASSESSMENT:    No diagnosis found. PLAN:    In order of problems listed above:  1. CAD/Coronary artery calcification: Previous CT image showed coronary calcification in mid RCA territory. Calcium score is low for his age and could have been related to his history of hyperparathyroidism. His CTA is very low risk with minimal atherosclerosis.  reassures  2. Hypertension: Blood pressure stable. Will monitor off HCTZ for now.   3. Hyperlipidemia: On Lipitor 40 mg daily. Reports last LDL 105. Given results of CTA I am happy continuing current dose of statin.  4. Obstructive sleep apnea: On CPAP.   Medication Adjustments/Labs and Tests Ordered: Current medicines are reviewed at length with the patient today.  Concerns regarding medicines are outlined above.  No orders of the defined types were placed in this encounter.  No orders of the defined types were placed in this encounter.   There are no Patient Instructions on file for this visit.   Signed, Tramar Brueckner Martinique, MD  01/26/2021 7:31 AM    [facility]

## 2021-01-26 NOTE — Telephone Encounter (Signed)
We can do a virtual visit- that would be fine  Evelina Lore Martinique MD, [facility]

## 2021-01-28 NOTE — Progress Notes (Signed)
Virtual Visit via Telephone Note   This visit type was conducted due to national recommendations for restrictions regarding the COVID-19 Pandemic (e.g. social distancing) in an effort to limit this patient's exposure and mitigate transmission in our community.  Due to his co-morbid illnesses, this patient is at least at moderate risk for complications without adequate follow up.  This format is felt to be most appropriate for this patient at this time.  The patient did not have access to video technology/had technical difficulties with video requiring transitioning to audio format only (telephone).  All issues noted in this document were discussed and addressed.  No physical exam could be performed with this format.  Please refer to the patient's chart for his  consent to telehealth for [facility].    Date:  01/29/2021   ID:  Robert [name], DOB Mar 12, 1946, MRN [id number] The patient was identified using 2 identifiers.  Patient Location: Home Provider Location: Office/Clinic   PCP:  Ginger Organ., MD   [facility] Providers Cardiologist:  Saphronia Ozdemir Martinique, MD     Evaluation Performed:  Follow-Up Visit  Chief Complaint:  HTN  History of Present Illness:    Robert [name] is a 75 y.o. male with a hx of coronary calcification, OSA on CPAP, HTN and HLD.  There was incidental finding of coronary calcification in mid RCA distribution.  Previous Myoview in June 2014 was negative for ischemia.  EF was normal on the Myoview.  He had history of facial flushing with crestor and pravastatin however able to tolerate Lipitor.  There was concern of bradycardia and fatigue in January 2018.  Toprol dose was reduced with improvement.  His monitor showed average heart rate of 64 with lowest heart rate 50.  EGD in June 2018 was normal.  He later developed her palpitation and went back to prior Toprol-XL dose.    He did undergo parathyroidectomy for hyperparathyroidism.  He was seen in  December with symptoms of chest pain/pressure.  He was diagnosed with Covid in November  however did not have any symptoms at all. This may have   He was scheduled for coronary CTA. This showed minimal nonobstructive CAD. Later he went to give blood and had  low BP readings of 100/50 and 98/48 and we recommended stopping HCTZ.  He reports recently having tested positive for Covid 19 by PCR. Really no symptoms except for runny nose and stuffiness which he states is his normal allergy symptoms. His BP is well controlled. He is tolerating medication well.    The patient does have symptoms concerning for COVID-19 infection noted above ( no fever, chills, cough, or new shortness of breath).    Past Medical History:  Diagnosis Date  . Anxiety   . Arthritis    Knee , Neck  . Colon cancer [facility])    a. s/p colon resection in 2004 and 2002  . Complication of anesthesia    difficulty urinating after  . Edema of right foot    ? muscle problem to be addressed after left knee TKA on 01/06/2015 per patient   . History of kidney stones   . Hyperlipidemia   . Hypertension   . 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 Nocturia associated with benign prostatic hypertrophy 03/21/2013  . Palpitations   . S/P cardiac catheterization 1995 and 1998   a. following abnormal stress tests. Both reportedly normal;  b. 02/2013 nl myoview.  . Sleep apnea with use  of continuous positive airway pressure (CPAP)    wears CPAP nightly   Past Surgical History:  Procedure Laterality Date  . CALCANEAL OSTEOTOMY Right 04/29/2016   Procedure: RIGHT CALCANEAL OSTEOTOMY, RIGHT MEDIAL CUNEIFORM OSTEOTOMY;  Surgeon: Wylene Simmer, MD;  Location: [place];  Service: Orthopedics;  Laterality: Right;  . CARDIAC CATHETERIZATION  07/04/1997  . COLON RESECTION  2004   x 2 per patient   . COLON SURGERY     colon resection  . COLONOSCOPY    . ELBOW SURGERY Left 2011   INFECTED OLECRANON BURSITIS SURGERY   . FOOT SURGERY Right   . GASTROCNEMIUS RECESSION Right 04/29/2016   Procedure: RIGHT GASTROC RECESSION;  Surgeon: Wylene Simmer, MD;  Location: [place];  Service: Orthopedics;  Laterality: Right;  . INGUINAL HERNIA REPAIR Right 06/03/2015   Procedure: REPAIR RIGHT INGUINAL HERNIA;  Surgeon: Georganna Skeans, MD;  Location: [place];  Service: General;  Laterality: Right;  . INSERTION OF MESH Right 06/03/2015   Procedure: INSERTION OF MESH;  Surgeon: Georganna Skeans, MD;  Location: [place];  Service: General;  Laterality: Right;  . JOINT REPLACEMENT Left   . L4 DISCECTOMY    . PARATHYROIDECTOMY Left 03/16/2019   Procedure: LEFT PARATHYROIDECTOMY;  Surgeon: Armandina Gemma, MD;  [facility];  Service: General;  Laterality: Left;  . [place]  . SEPTIC JOINT Left    Elbow  . TOTAL KNEE ARTHROPLASTY Left 01/06/2015   Procedure: LEFT TOTAL KNEE ARTHROPLASTY;  Surgeon: Gaynelle Arabian, MD;  [facility];  Service: Orthopedics;  Laterality: Left;  . TOTAL KNEE ARTHROPLASTY Right 10/03/2015   Procedure: RIGHT TOTAL KNEE ARTHROPLASTY;  Surgeon: Gaynelle Arabian, MD;  [facility];  Service: Orthopedics;  Laterality: Right;  . TRANSURETHRAL RESECTION OF PROSTATE       Current Meds  Medication Sig  . acetaminophen (TYLENOL) 500 MG tablet Take 500 mg by mouth at bedtime.  Marland Kitchen atorvastatin (LIPITOR) 40 MG tablet TAKE 1 TABLET BY MOUTH EVERY DAY  . cholecalciferol (VITAMIN D3) 25 MCG (1000 UT) tablet Take 1,000 Units by mouth daily.  Marland Kitchen CIALIS 5 MG tablet Take 5 mg by mouth daily at 3 pm.   . diltiazem (CARDIZEM CD) 180 MG 24 hr capsule TAKE 1 CAPSULE (180 MG TOTAL) BY MOUTH AT BEDTIME.  Marland Kitchen ezetimibe (ZETIA) 10 MG tablet Take 1 tablet (10 mg total) by mouth daily.  . fexofenadine (ALLEGRA) 180 MG tablet Take 180 mg by mouth daily.  . fluticasone (FLONASE) 50 MCG/ACT nasal spray Place 1 spray into both nostrils daily.   Marland Kitchen ibuprofen (ADVIL,MOTRIN) 200 MG tablet Take 200 mg by  mouth at bedtime.   . lidocaine (LMX) 4 % cream Apply 1 application topically at bedtime as needed (foot pain).  Marland Kitchen lisinopril (ZESTRIL) 20 MG tablet   . LORazepam (ATIVAN) 1 MG tablet Take 0.5 tablets (0.5 mg total) by mouth 2 (two) times daily as needed for anxiety.  . metoprolol succinate (TOPROL-XL) 25 MG 24 hr tablet TAKE 1 TABLET BY MOUTH EVERY DAY  . Multiple Vitamin (MULTIVITAMIN WITH MINERALS) TABS tablet Take 1 tablet by mouth daily.  . NON FORMULARY CPAP MACHINE  . Omega-3 Fatty Acids (FISH OIL) 1000 MG CPDR Take 1,000 mg by mouth daily.   . Probiotic Product (PROBIOTIC DAILY PO) Take 1 capsule by mouth daily.     Allergies:   Betadine [povidone iodine] and Oxycodone   Social History   Tobacco Use  .  Smoking status: Never [name]  . Smokeless tobacco: Never Used  Vaping Use  . Vaping Use: Never used  Substance Use Topics  . Alcohol use: Yes    Alcohol/week: 17.0 standard drinks    Types: 8 Glasses of wine, 8 Shots of liquor, 1 Standard drinks or equivalent per week    Comment: each night 1 glass  . Drug use: No     Family Hx: The patient's family history includes Emphysema in his mother; Heart attack in his father; High Cholesterol in an other family member; Hypertension in his father and another family member.  ROS:   Please see the history of present illness.    All other systems reviewed and are negative.   Prior CV studies:   The following studies were reviewed today:  ETT 03/15/2017 Study Highlights   Upsloping ST segment depression ST segment depression was noted during stress in the II, III, aVF, V3, V4 and V5 leads, and returning to baseline after less than 1 minute of recovery.  No T wave inversion was noted during stress.  Negative, adequate stress test.  Excellent exercise capacity.  Blood pressure demonstrated a normal response to exercise   Coronary CTA 11/18/19:  OVER-READ INTERPRETATION CT CHEST  The following report is an over-read  performed by radiologist Dr. Norlene Duel [facility] on 11/02/2019. This over-read does not include interpretation of cardiac or coronary anatomy or pathology. The coronary CTA interpretation by the cardiologist is attached.  COMPARISON: None.  FINDINGS: Cardiovascular: Mild cardiac enlargement. No pericardial effusion. Mild aortic atherosclerosis identified.  Mediastinum/nodes: No mass or adenopathy identified.  Lungs/pleura: No pleural effusion or airspace consolidation. Dependent changes including increased ground-glass attenuation and interstitial thickening identified in the posterior lung bases.  Upper abdomen: No acute abnormality.  Musculoskeletal: No acute or significant osseous findings.  IMPRESSION: 1. No significant noncardiac findings identified. Dependent type changes noted within both lung bases. 2. Aortic Atherosclerosis (ICD10-I70.0).   Electronically Signed By: Kerby Moors M.D. On: 11/02/2019 08:35   Addended by Kerby Moors, MD on 11/02/2019 8:37 AM     Study Result  CLINICAL DATA: Hx of htn, hld obesity, abnormal ETT  EXAM: Cardiac/Coronary CTA  TECHNIQUE: The patient was scanned on a Siemens Somatoform go.Top scanner.  FINDINGS: A retrospective scan was triggered in the descending thoracic aorta. Axial non-contrast 3 mm slices were carried out through the heart. The data set was analyzed on a dedicated work station and scored using the [place]. Gantry rotation speed was 330 msecs and collimation was .6 mm. 50mg  of metoprolol and 0.8 mg of sl NTG was given. The 3D data set was reconstructed in 5% intervals of the 60-95 % of the R-R cycle. Diastolic phases were analyzed on a dedicated work station using MPR, MIP and VRT modes. The patient received 75 cc of contrast.  Aorta: Normal size. Mild aortic wall calcifications noted in the ascending and descending thoracic segment. No  dissection.  Aortic Valve: Trileaflet. No calcifications.  Coronary Arteries: Normal coronary origin. Right dominance.  RCA is a large dominant artery that gives rise to PDA and PLA. There is minimal calcified plaque in the mid RCA causing non obstructive disease (0-24%).  Left main is a large artery that gives rise to LAD and LCX arteries.  LAD is a large vessel that has mild calcified plaque the mid segment causing minimal nonobstructive disease (0-24%).  LCX is a non-dominant artery that gives rise to 2 obtuse marginal branches. There is no plaque.  Other findings:  Normal pulmonary vein drainage into the left atrium.  Normal left atrial appendage without a thrombus.  Normal size of the pulmonary artery.  IMPRESSION: 1. Coronary calcium score of 25.7. This was 23rd percentile for age and sex matched control.  2. Mild ascending and descending thoracic aortic wall calcifications  3. Normal coronary origin with right dominance.  4. Minimal non obstructive calcified plaque in the mid RCA and LAD  5. CAD-RADS 1. Minimal non-obstructive CAD (0-24%). Consider non-atherosclerotic causes of chest pain. Consider preventive therapy and risk factor modification.  Electronically Signed: By: Kate Sable M.D. On: 11/01/2019 15:47          Labs/Other Tests and Data Reviewed:    EKG:  No ECG reviewed.  Recent Labs: 01/20/2021: ALT 42; BUN 24; Creatinine, Ser 0.98; Potassium 4.4; Sodium 140   Recent Lipid Panel Lab Results  Component Value Date/Time   CHOL 146 01/20/2021 09:16 AM   TRIG 109 01/20/2021 09:16 AM   HDL 53 01/20/2021 09:16 AM   CHOLHDL 2.8 01/20/2021 09:16 AM   CHOLHDL 3.5 01/27/2017 08:09 AM   LDLCALC 73 01/20/2021 09:16 AM    Wt Readings from Last 3 Encounters:  01/29/21 173 lb (78.5 kg)  09/11/20 178 lb (80.7 kg)  06/30/20 177 lb 3.2 oz (80.4 kg)     Risk Assessment/Calculations:      Objective:    Vital  Signs:  BP 132/70   Pulse 67   Temp (!) 96.5 F (35.8 C)   Ht 5\' 9"  (1.753 m)   Wt 173 lb (78.5 kg)   BMI 25.55 kg/m    VITAL SIGNS:  reviewed  ASSESSMENT & PLAN:    1. CAD/Coronary artery calcification: Previous CT image showed coronary calcification in mid RCA territory. Calcium score is low for his age and could have been related to his history of hyperparathyroidism. His CTA is very low risk with minimal atherosclerosis. reassures  2. Hypertension: Blood pressure is well controlled.   3. Hyperlipidemia: On Lipitor 40 mg daily and Zetia 10 mg daily. LDL is at goal with LDL 73.   4. Obstructive sleep apnea: On CPAP.         COVID-19 Education: The signs and symptoms of COVID-19 were discussed with the patient and how to seek care for testing (follow up with PCP or arrange E-visit).  The importance of social distancing was discussed today.  Time:   Today, I have spent 10 minutes with the patient with telehealth technology discussing the above problems.     Medication Adjustments/Labs and Tests Ordered: Current medicines are reviewed at length with the patient today.  Concerns regarding medicines are outlined above.   Tests Ordered: No orders of the defined types were placed in this encounter.   Medication Changes: No orders of the defined types were placed in this encounter.   Follow Up:  In Person in 6 month(s)  Signed, Ria Redcay Martinique, MD  01/29/2021 9:24 AM    [facility]

## 2021-01-29 ENCOUNTER — Encounter: Payer: Self-pay | Admitting: Cardiology

## 2021-01-29 ENCOUNTER — Telehealth (INDEPENDENT_AMBULATORY_CARE_PROVIDER_SITE_OTHER): Payer: Medicare Other | Admitting: Cardiology

## 2021-01-29 ENCOUNTER — Ambulatory Visit: Payer: Medicare Other | Admitting: Cardiology

## 2021-01-29 VITALS — BP 132/70 | HR 67 | Temp 96.5°F | Ht 69.0 in | Wt 173.0 lb

## 2021-01-29 DIAGNOSIS — I2510 Atherosclerotic heart disease of native coronary artery without angina pectoris: Secondary | ICD-10-CM | POA: Diagnosis not present

## 2021-01-29 DIAGNOSIS — I10 Essential (primary) hypertension: Secondary | ICD-10-CM

## 2021-01-29 DIAGNOSIS — E785 Hyperlipidemia, unspecified: Secondary | ICD-10-CM

## 2021-01-29 NOTE — Patient Instructions (Signed)
Medication Instructions:  Continue same medications *If you need a refill on your cardiac medications before your next appointment, please call your pharmacy*   Lab Work: None ordered   Testing/Procedures: None ordered   Follow-Up: At [facility], you and your health needs are our priority.  As part of our continuing mission to provide you with exceptional heart care, we have created designated Provider Care Teams.  These Care Teams include your primary Cardiologist (physician) and Advanced Practice Providers (APPs -  Physician Assistants and Nurse Practitioners) who all work together to provide you with the care you need, when you need it.  We recommend signing up for the patient portal called "MyChart".  Sign up information is provided on this After Visit Summary.  MyChart is used to connect with patients for Virtual Visits (Telemedicine).  Patients are able to view lab/test results, encounter notes, upcoming appointments, etc.  Non-urgent messages can be sent to your provider as well.   To learn more about what you can do with MyChart, go to [email or web address].    Your next appointment: 6 months    Call in August to schedule Nov appointment    The format for your next appointment: Office   Provider: Dr.Jordan

## 2021-02-02 ENCOUNTER — Other Ambulatory Visit: Payer: Self-pay | Admitting: Neurology

## 2021-02-02 MED ORDER — LORAZEPAM 1 MG PO TABS
0.5000 mg | ORAL_TABLET | Freq: Two times a day (BID) | ORAL | 0 refills | Status: DC | PRN
Start: 2021-02-02 — End: 2021-06-08

## 2021-03-03 ENCOUNTER — Emergency Department (HOSPITAL_BASED_OUTPATIENT_CLINIC_OR_DEPARTMENT_OTHER): Payer: Medicare Other | Admitting: Radiology

## 2021-03-03 ENCOUNTER — Emergency Department (HOSPITAL_BASED_OUTPATIENT_CLINIC_OR_DEPARTMENT_OTHER)
Admission: EM | Admit: 2021-03-03 | Discharge: 2021-03-03 | Disposition: A | Discharge status: Home / Self Care | Payer: Medicare Other | Attending: Emergency Medicine | Admitting: Emergency Medicine

## 2021-03-03 ENCOUNTER — Other Ambulatory Visit: Payer: Self-pay

## 2021-03-03 ENCOUNTER — Encounter (HOSPITAL_BASED_OUTPATIENT_CLINIC_OR_DEPARTMENT_OTHER): Payer: Self-pay

## 2021-03-03 DIAGNOSIS — Z85038 Personal history of other malignant neoplasm of large intestine: Secondary | ICD-10-CM | POA: Diagnosis not present

## 2021-03-03 DIAGNOSIS — R0789 Other chest pain: Secondary | ICD-10-CM | POA: Insufficient documentation

## 2021-03-03 DIAGNOSIS — Z96653 Presence of artificial knee joint, bilateral: Secondary | ICD-10-CM | POA: Diagnosis not present

## 2021-03-03 DIAGNOSIS — I10 Essential (primary) hypertension: Secondary | ICD-10-CM | POA: Diagnosis not present

## 2021-03-03 LAB — COMPREHENSIVE METABOLIC PANEL
ALT: 37 U/L (ref 0–44)
AST: 26 U/L (ref 15–41)
Albumin: 4.8 g/dL (ref 3.5–5.0)
Alkaline Phosphatase: 72 U/L (ref 38–126)
Anion gap: 9 (ref 5–15)
BUN: 22 mg/dL (ref 8–23)
CO2: 27 mmol/L (ref 22–32)
Calcium: 9.6 mg/dL (ref 8.9–10.3)
Chloride: 102 mmol/L (ref 98–111)
Creatinine, Ser: 1 mg/dL (ref 0.61–1.24)
GFR, Estimated: >60 mL/min (ref >60)
Glucose, Bld: 88 mg/dL (ref 70–99)
Potassium: 4.1 mmol/L (ref 3.5–5.1)
Sodium: 138 mmol/L (ref 135–145)
Total Bilirubin: 0.6 mg/dL (ref 0.3–1.2)
Total Protein: 7.3 g/dL (ref 6.5–8.1)

## 2021-03-03 LAB — CBC WITH DIFFERENTIAL/PLATELET
Abs Immature Granulocytes: 0.02 10*3/uL (ref 0.00–0.07)
Basophils Absolute: 0 10*3/uL (ref 0.0–0.1)
Basophils Relative: 0 %
Eosinophils Absolute: 0.1 10*3/uL (ref 0.0–0.5)
Eosinophils Relative: 2 %
HCT: 47.9 % (ref 39.0–52.0)
Hemoglobin: 15.8 g/dL (ref 13.0–17.0)
Immature Granulocytes: 0 %
Lymphocytes Relative: 19 %
Lymphs Abs: 1.4 10*3/uL (ref 0.7–4.0)
MCH: 29.5 pg (ref 26.0–34.0)
MCHC: 33 g/dL (ref 30.0–36.0)
MCV: 89.4 fL (ref 80.0–100.0)
Monocytes Absolute: 0.6 10*3/uL (ref 0.1–1.0)
Monocytes Relative: 9 %
Neutro Abs: 5.3 10*3/uL (ref 1.7–7.7)
Neutrophils Relative %: 70 %
Platelets: 193 10*3/uL (ref 150–400)
RBC: 5.36 MIL/uL (ref 4.22–5.81)
RDW: 12.8 % (ref 11.5–15.5)
WBC: 7.5 10*3/uL (ref 4.0–10.5)
nRBC: 0 % (ref 0.0–0.2)

## 2021-03-03 LAB — TROPONIN I (HIGH SENSITIVITY)
Troponin I (High Sensitivity): 5 ng/L (ref <18)
Troponin I (High Sensitivity): 4 ng/L (ref <18)

## 2021-03-03 NOTE — ED Triage Notes (Addendum)
Chest pain started in left anterior chest starting 1615 today, mild per patient, coming intermittently. Lasting "a second or two".  Patient took 650 mg aspirin, and 0.5mg  lorazepam at 1700

## 2021-03-03 NOTE — Discharge Instructions (Addendum)
He was seen in the Emergency Department today with atypical chest pain.  Your lab work does not show sign of a heart attack and your chest x-ray was normal.  Please discuss with your primary care doctor and with Dr. Martinique so they can review your ED visit and plan further follow-up appointments.  If you develop any new or suddenly worsening chest pain symptoms or other severe symptoms please return to the emergency department.

## 2021-03-03 NOTE — ED Provider Notes (Signed)
Emergency Department Provider Note   I have reviewed the triage vital signs and the nursing notes.   HISTORY  Chief Complaint Chest Pain   HPI Robert [name] is a 75 y.o. male with past medical history reviewed below presents emergency department with occasional, intermittent pains in the left chest starting this afternoon.  Patient states it feels like an electrical "twinge" of pain that is very mild.  Denies pleuritic quality to pain or shortness of breath.  No heavy/tight feeling in the chest, diaphoresis, nausea.  Pain is momentary but then returns again at fairly random intervals.  Not clearly brought on by exertion.   Past Medical History:  Diagnosis Date   Anxiety    Arthritis    Knee , Neck   Colon cancer ([place])    a. s/p colon resection in 2725 and 3664   Complication of anesthesia    difficulty urinating after   Edema of right foot    ? 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 following abnormal stress tests. Both reportedly normal;  b. 02/2013 nl myoview.   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   Nocturia more than twice per night 04/24/2015   OA (osteoarthritis) of knee 01/06/2015   OSA on CPAP 03/20/2014   Palpitations 10/23/2013   Nocturia associated with benign prostatic hypertrophy 03/21/2013   Sleep apnea with use of continuous positive airway pressure (CPAP)    Coronary artery calcification seen on CAT scan 05/25/2012   Tachycardia  12/08/2011   Hypertension    Hyperlipidemia     Past Surgical History:  Procedure Laterality Date   CALCANEAL OSTEOTOMY Right 04/29/2016   Procedure: RIGHT CALCANEAL OSTEOTOMY, RIGHT MEDIAL CUNEIFORM OSTEOTOMY;  Surgeon: Wylene Simmer, MD;  Location: [place];  Service: Orthopedics;  Laterality: Right;   CARDIAC CATHETERIZATION  07/04/1997   COLON RESECTION  2004   x 2 per patient    COLON SURGERY     colon resection   COLONOSCOPY     ELBOW SURGERY Left 2011   INFECTED OLECRANON BURSITIS SURGERY   FOOT SURGERY Right    GASTROCNEMIUS RECESSION Right 04/29/2016   Procedure: RIGHT GASTROC RECESSION;  Surgeon: Wylene Simmer, MD;  Location: [place];  Service: Orthopedics;  Laterality: Right;   INGUINAL HERNIA REPAIR Right 06/03/2015   Procedure: REPAIR RIGHT INGUINAL HERNIA;  Surgeon: Georganna Skeans, MD;  Location: [place];  Service: General;  Laterality: Right;   INSERTION OF MESH Right 06/03/2015   Procedure: INSERTION OF MESH;  Surgeon: Georganna Skeans, MD;  Location: [place];  Service: General;  Laterality: Right;   JOINT REPLACEMENT Left    L4 DISCECTOMY     PARATHYROIDECTOMY Left 03/16/2019   Procedure: LEFT PARATHYROIDECTOMY;  Surgeon: Armandina Gemma, MD;  [facility];  Service: General;  Laterality: Left;   RETINAL DETACHMENT SURGERY  1998   SEPTIC JOINT Left    Elbow   TOTAL  KNEE ARTHROPLASTY Left 01/06/2015   Procedure: LEFT TOTAL KNEE ARTHROPLASTY;  Surgeon: Gaynelle Arabian, MD;  [facility];  Service: Orthopedics;  Laterality: Left;   TOTAL KNEE ARTHROPLASTY Right 10/03/2015   Procedure: RIGHT TOTAL KNEE ARTHROPLASTY;  Surgeon: Gaynelle Arabian, MD;  [facility];  Service: Orthopedics;  Laterality: Right;   TRANSURETHRAL RESECTION OF PROSTATE      Allergies Betadine [povidone iodine] and Oxycodone  Family History  Problem Relation Age of Onset   Emphysema Mother    Heart attack Father    Hypertension Father    Hypertension Other    High  Cholesterol Other     Social History Social History   Tobacco Use   Smoking status: Never   Smokeless tobacco: Never  Vaping Use   Vaping Use: Never used  Substance Use Topics   Alcohol use: Yes    Alcohol/week: 17.0 standard drinks    Types: 8 Glasses of wine, 8 Shots of liquor, 1 Standard drinks or equivalent per week    Comment: each night 1 glass   Drug use: No    Review of Systems  Constitutional: No fever/chills Eyes: No visual changes. ENT: No sore throat. Cardiovascular: Positive chest pain. Respiratory: Denies shortness of breath. Gastrointestinal: No abdominal pain.  No nausea, no vomiting.  No diarrhea.  No constipation. Genitourinary: Negative for dysuria. Musculoskeletal: Negative for back pain. Skin: Negative for rash. Neurological: Negative for headaches, focal weakness or numbness.  10-point ROS otherwise negative.  ____________________________________________   PHYSICAL EXAM:  VITAL SIGNS: ED Triage Vitals  Enc Vitals Group     BP 03/03/21 1824 (!) 144/76     Pulse Rate 03/03/21 1824 62     Resp 03/03/21 1824 16     Temp 03/03/21 1824 98.1 F (36.7 C)     Temp Source 03/03/21 1824 Oral     SpO2 03/03/21 1824 100 %     Weight 03/03/21 1829 170 lb (77.1 kg)     Height 03/03/21 1829 5\' 9"  (1.753 m)   Constitutional: Alert and oriented. Well appearing and in no acute distress. Eyes: Conjunctivae are normal.  Head: Atraumatic. Nose: No congestion/rhinnorhea. Mouth/Throat: Mucous membranes are moist.   Neck: No stridor. Cardiovascular: Normal rate, regular rhythm. Good peripheral circulation. Grossly normal heart sounds.   Respiratory: Normal respiratory effort.  No retractions. Lungs CTAB. Gastrointestinal: Soft and nontender. No distention.  Musculoskeletal: No lower extremity tenderness nor edema. No gross deformities of extremities. Neurologic:  Normal speech and language. No gross focal neurologic deficits are appreciated.  Skin:  Skin is  warm, dry and intact. No rash noted.   ____________________________________________   LABS (all labs ordered are listed, but only abnormal results are displayed)  Labs Reviewed  COMPREHENSIVE METABOLIC PANEL  CBC WITH DIFFERENTIAL/PLATELET  TROPONIN I (HIGH SENSITIVITY)  TROPONIN I (HIGH SENSITIVITY)   ____________________________________________  EKG   EKG Interpretation  Date/Time:  Tuesday March 03 2021 18:29:09 EDT Ventricular Rate:  58 PR Interval:  166 QRS Duration: 90 QT Interval:  434 QTC Calculation: 426 R Axis:   11 Text Interpretation: Sinus bradycardia Possible Left atrial enlargement Borderline ECG No significant pain from prior tracing Confirmed by Nanda Quinton [phone]) on 03/03/2021 6:36:20 PM         ____________________________________________  RADIOLOGY  CXR reviewed   ____________________________________________   PROCEDURES  Procedure(s) performed:   Procedures  None  ____________________________________________   INITIAL IMPRESSION / ASSESSMENT AND PLAN / ED COURSE  Pertinent labs & imaging results  that were available during my care of the patient were reviewed by me and considered in my medical decision making (see chart for details).   Patient presents emergency department with very atypical chest pain starting this afternoon.  Occasional pain here in the emergency department but very brief.  Does not seem consistent with PE.  Lower suspicion for ACS.  EKG seems similar to prior tracings on comparison.  Plan for troponin along with CMP and chest x-ray.  Serial troponin negative. Plan for discharge with PCP follow up plan. ED return precautions reviewed.  ____________________________________________  FINAL CLINICAL IMPRESSION(S) / ED DIAGNOSES  Final diagnoses:  Atypical chest pain     Note:  This document was prepared using Dragon voice recognition software and may include unintentional dictation errors.  Nanda Quinton, MD,  [facility] Emergency Medicine    Kamie Korber, [facility], MD 03/05/21 [phone]

## 2021-03-11 ENCOUNTER — Other Ambulatory Visit: Payer: Self-pay | Admitting: Cardiology

## 2021-03-28 ENCOUNTER — Other Ambulatory Visit: Payer: Self-pay | Admitting: Cardiology

## 2021-06-07 ENCOUNTER — Other Ambulatory Visit: Payer: Self-pay | Admitting: Neurology

## 2021-06-08 DIAGNOSIS — M79644 Pain in right finger(s): Secondary | ICD-10-CM | POA: Insufficient documentation

## 2021-06-08 NOTE — Telephone Encounter (Signed)
Per [place] registry, last filled on 04/15/2021 #30. Will send refill request to Dr Brett Fairy to e-scribe. Pt's next appt will be 07/13/21.

## 2021-06-09 DIAGNOSIS — M25342 Other instability, left hand: Secondary | ICD-10-CM | POA: Insufficient documentation

## 2021-07-07 ENCOUNTER — Other Ambulatory Visit: Payer: Self-pay | Admitting: Neurology

## 2021-07-07 NOTE — Telephone Encounter (Signed)
Received refill request for Lorazepam.  Last OV was on 09/11/20.  Next OV is scheduled for 07/13/21 .  Last RX was written on 06/09/21 for 30 tabs.   [place] [facility] Database has been reviewed.

## 2021-07-08 ENCOUNTER — Encounter: Payer: Self-pay | Admitting: Neurology

## 2021-07-13 ENCOUNTER — Encounter: Payer: Self-pay | Admitting: Neurology

## 2021-07-13 ENCOUNTER — Ambulatory Visit: Payer: Medicare Other | Admitting: Neurology

## 2021-07-13 VITALS — BP 120/63 | HR 60 | Ht 69.0 in | Wt 178.0 lb

## 2021-07-13 DIAGNOSIS — Z87898 Personal history of other specified conditions: Secondary | ICD-10-CM

## 2021-07-13 DIAGNOSIS — Z9989 Dependence on other enabling machines and devices: Secondary | ICD-10-CM

## 2021-07-13 DIAGNOSIS — G4733 Obstructive sleep apnea (adult) (pediatric): Secondary | ICD-10-CM

## 2021-07-13 NOTE — Progress Notes (Signed)
[facility]   Provider:  Dr. Asencion Partridge Elika Godar Referring Provider: Ginger Organ., MD Primary Care Physician:  Ginger Organ., MD  Chief Complaint  Patient presents with   Obstructive Sleep Apnea    Rm 10, alone. Here for a 10 month CPAP f/u. Pt c/o of dry mouth w new CPAP machine.      HPI:  Robert [name] is a 75 y.o. male here as a revisit for Insomnia and OSA , referred originally by [name] , Urologist.  Dr. [name] is this patient's PCP. Dr [name] is his oncologist.  [name] begun having fragmented sleep and finally  would wake up 5-7 times at night to relieve himself. He tried Flomax without success and then by the recently changed urologists and 2013. [name] finally suspected that sleep apnea may be a contributing cause. He also had seen a dentist about the same time noticed him falling asleep in the dental chair and urgent to get an workup for obstructive sleep apnea. Dr. [name], his primary care physician ordered a sleep study and the patient tested positive for apnea he was titrated to CPAP in a split night procedure the study was performed. The split night procedure took place on 08-16-12 the patient's BMI at that time was 25 his Epworth sleepiness score 4/24 points his neck circumference 15-1/2 inches. He and was to exhalatory at one point. Apnea hypotony index was 18.5 and RDI 18.9 there was no strong REM complement the supine complement. Lowest desaturation was 78% supine REM sleep was 32.8 minutes of desaturation. The patient was titrated to 6 cm water pressure as an AHI of less than 5. He then underwent a  Auto- titration and compliance report through the December 2013 into January 2040,  he used his machine 6 hours 30 minutes at night was 100% compliant , his  AHI  was 2.9 in late January 2014 .Patient uses CPAP at 4 to 8 cm water autoset with a residual AHI of 1.7 and average user time 5 hours 27 minutes. Low air  leak, 90 days of 93% compliance.He still has nocturia,  TURP procedure 1995, Epworth 5 and FSS 30 points, Patient has found a 'cocktail " that helps him to sleep , using an antihistamine along with prescription sleep aid.   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  He will bring her CPAP machine [facility] the [facility]. He has been having nightmares on unisom. He is treated with cialis for BPH(!). He is 80% better in terms of Nocturia.  He was on a [place] and woke up 3 times at night, but this was better than at home, and he thinks it was due to his higher level of physicial activity. Lorazepam is used PRN.  I would feel unconcerned about him using a benzodiazepine for the rest of his life.  Interval history from 04/20/2016. With the couple of just returned from his daughter's wedding, with a very exciting event he is 97% compliance with CPAP use uses an AutoSet between 4 and 8 cm water with 27 m EPR his residual AHI is close to ideal at 1.4 per hour. No adjustments need to be made. He has been losing weight , plans to retire this Friday.  He tore his right posterior tibial tendon and plans to undergo surgery.   Patient seen here on 09-12-2019, meanwhile 75 year old-  He sleeps  best on sialis, now generic and CPAP, having reduced his nocturia to 2 times from 5-6 times. He can some nights sleep 5 hours straight.  He enjoyed wine, taking [place], and [employer] cruises, enjoys these tremendously.  He underwent bilateral knee replacement time deferred, and is now walking 5 miles each day pain-free.  He has been able to control weight, hypertension.  His new CPAP machine is an air sense 10 AutoSet between 4 and 8 cmH2O with 1 cm water pressure expiratory relief he has used the machine 29 out of 30 days with over 4 hours with an average user time of 7 5 hours 7 minutes.  Residual AHI is 1.0 apneas per hour which is physiologically normal the  95th percentile pressure is 7.4 he has mild air leakage, he is not excessively daytime sleepy, he would like to donate his S9 AutoSet machine which she on before with similar settings we will for now and followed by modem the serial number machine [id number].   09-11-2020: [name] reports good sleep overall, his new [employer] ADAPT- will replace his nasal pillow every 14 days. He will inquire in his pharmacy about a petroleum free ointment for his nose.  Less Airleak, less urinary frequency with Sialis and 0.5 mg lorazepam helps to sleep , replaced the benadryl-  And it's anticholinergic side effects in an aging patient. [place].  - exercises.  He gets about 6 hours of sleep - much better than before we started his journey. 100 % compliance. Residual AHI 1.4/h and a 95% air leak of 19.5 L/minutes. Auto setting from 4-8 cm with 1 cm EPR-  He plays banjo- will take a break after elbow surgery on his right elbow, currently in a brace.   07-13-2021: [name], 75 year-old patient and compliant CPAP user, he no longer has nocturia on CPAP and on Cialis,  CPAP- and remains highly compliant he has used the machine 29 out of 30 days 97% and over 4 hours consecutive use was at 90% for 27 days.  Average use at time is 5 hours 35 minutes.  The patient machine is set between a minimum pressure of 4 and a maximum pressure of 8 cmH2O with 1 cm expiratory pressure relief.  The residual AHI was 1.5/h the 95th percentile pressure is 7.8 cmH2O air leaks are seen at the 95th percentile pressure at 24.5 L/min this is a moderate air leak.  The there is a 6 residual RERA index of 2.0/h no Cheyne-Stokes respirations were measured.  He would like a face to face refitting for a mask that offers better fit.      Review of Systems: Out of a complete 14 system review, the patient complains of only the following symptoms, and all other reviewed systems  are negative. Nocturia  is down  from 6-7 times at night to 2-3 times.   How likely are you to doze in the following situations: 0 = not likely, 1 = slight chance, 2 = moderate chance, 3 = high chance  Sitting and Reading? Watching Television? Sitting inactive in a public place (theater or meeting)? Lying down in the afternoon when circumstances permit? Sitting and talking to someone? Sitting quietly after lunch without alcohol? In a car, while stopped for a few minutes in traffic? As a passenger in a car for an hour without a break?  Total = 3/ 24  Epworth Sleepiness Scale today is score today is 3/ 24  points,  and  FSS 22 points, and the GDS depression score remains zero  Points.  No longer nightmares , reduced Nocturia- but not resolved .  GDS 0/ 15   He takes Malatonin.  Social History   Socioeconomic History   Marital status: Married    Spouse name: [name]   Number of children: 3   Years of education: Ph.D   Highest education level: Not on file  Occupational History   Occupation: ADMIN    Employer: [employer]    Comment: [employer]  Tobacco Use   Smoking status: Never   Smokeless tobacco: Never  Vaping Use   Vaping Use: Never used  Substance and Sexual Activity   Alcohol use: Yes    Alcohol/week: 17.0 standard drinks    Types: 8 Glasses of wine, 8 Shots of liquor, 1 Standard drinks or equivalent per week    Comment: each night 1 glass   Drug use: No   Sexual activity: Yes  Other Topics Concern   Not on file  Social History Narrative   Sleep apnea 28 AHI was reduced to 3.4- set to  7 cm water pressure on nasal mask eson or wisp.  He sleep  nonsupine, less nocturia.  Tennis ball method explained.  Patient does not want machine set to a certain pressure.     Downloaded CMS compliance s etsablsihed .  Insomnia not improved further - will try seroquel as  medication approach and discussed behaviour therapy approach .   send for n beahiour therapy and  started trial of SEROQUEL>  25 minute visit including CMS compliance and downloading.    Patient is married [name]) and lives at home with his wife.   Patient has three children and his wife has three children.   Patient is working full-time.   Patient has a Ph.D.   Patient is right-handed.   Patient drinks about 21 oz of coffee daily.         Social Determinants of Health   Financial Resource Strain: Not on file  Food Insecurity: Not on file  Transportation Needs: Not on file  Physical Activity: Not on file  Stress: Not on file  Social Connections: Not on file  Intimate Partner Violence: Not on file    Family History  Problem Relation Age of Onset   Emphysema Mother    Heart attack Father    Hypertension Father    Hypertension Other    High Cholesterol Other     Past Medical History:  Diagnosis Date   Anxiety    Arthritis    Knee , Neck   Colon cancer ([place])    a. s/p colon resection in 9450 and 3888   Complication of anesthesia    difficulty urinating after   Edema of right foot    ? 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 following abnormal stress tests. Both reportedly normal;  b. 02/2013 nl myoview.   Sleep apnea with use of continuous positive airway pressure (CPAP)    wears CPAP nightly    Past Surgical History:  Procedure Laterality Date  CALCANEAL OSTEOTOMY Right 04/29/2016   Procedure: RIGHT CALCANEAL OSTEOTOMY, RIGHT MEDIAL CUNEIFORM OSTEOTOMY;  Surgeon: Wylene Simmer, MD;  Location: [place];  Service: Orthopedics;  Laterality: Right;   CARDIAC CATHETERIZATION  07/04/1997   COLON RESECTION  2004   x 2 per patient    COLON SURGERY     colon resection    COLONOSCOPY     ELBOW SURGERY Left 2011   INFECTED OLECRANON BURSITIS SURGERY   FOOT SURGERY Right    GASTROCNEMIUS RECESSION Right 04/29/2016   Procedure: RIGHT GASTROC RECESSION;  Surgeon: Wylene Simmer, MD;  Location: [place];  Service: Orthopedics;  Laterality: Right;   INGUINAL HERNIA REPAIR Right 06/03/2015   Procedure: REPAIR RIGHT INGUINAL HERNIA;  Surgeon: Georganna Skeans, MD;  Location: [place];  Service: General;  Laterality: Right;   INSERTION OF MESH Right 06/03/2015   Procedure: INSERTION OF MESH;  Surgeon: Georganna Skeans, MD;  Location: [place];  Service: General;  Laterality: Right;   JOINT REPLACEMENT Left    L4 DISCECTOMY     PARATHYROIDECTOMY Left 03/16/2019   Procedure: LEFT PARATHYROIDECTOMY;  Surgeon: Armandina Gemma, MD;  Location: [facility];  Service: General;  Laterality: Left;   RETINAL DETACHMENT SURGERY  1998   SEPTIC JOINT Left    Elbow   TOTAL KNEE ARTHROPLASTY Left 01/06/2015   Procedure: LEFT TOTAL KNEE ARTHROPLASTY;  Surgeon: Gaynelle Arabian, MD;  Location: [facility];  Service: Orthopedics;  Laterality: Left;   TOTAL KNEE ARTHROPLASTY Right 10/03/2015   Procedure: RIGHT TOTAL KNEE ARTHROPLASTY;  Surgeon: Gaynelle Arabian, MD;  Location: [facility];  Service: Orthopedics;  Laterality: Right;   TRANSURETHRAL RESECTION OF PROSTATE      Current Outpatient Medications  Medication Sig Dispense Refill   acetaminophen (TYLENOL) 500 MG tablet Take 500 mg by mouth at bedtime.     atorvastatin (LIPITOR) 40 MG tablet TAKE 1 TABLET BY MOUTH EVERY DAY 90 tablet 2   cholecalciferol (VITAMIN D3) 25 MCG (1000 UT) tablet Take 1,000 Units by mouth daily.     CIALIS 5 MG tablet Take 5 mg by mouth daily at 3 pm.      diltiazem (CARDIZEM CD) 180 MG 24 hr capsule TAKE 1 CAPSULE (180 MG TOTAL) BY MOUTH AT BEDTIME. 90 capsule 3   ezetimibe (ZETIA) 10 MG tablet Take 1 tablet (10 mg total) by mouth daily. 90 tablet 3   fexofenadine (ALLEGRA) 180 MG tablet Take 180 mg by mouth daily.      fluticasone (FLONASE) 50 MCG/ACT nasal spray Place 1 spray into both nostrils daily.      ibuprofen (ADVIL,MOTRIN) 200 MG tablet Take 200 mg by mouth at bedtime.      lidocaine (LMX) 4 % cream Apply 1 application topically at bedtime as needed (foot pain).     lisinopril (ZESTRIL) 20 MG tablet TAKE 1 TABLET BY MOUTH EVERY DAY 90 tablet 3   LORazepam (ATIVAN) 1 MG tablet TAKE 0.5 TABLETS (0.5 MG TOTAL) BY MOUTH 2 (TWO) TIMES DAILY AS NEEDED FOR ANXIETY. 30 tablet 1   metoprolol succinate (TOPROL-XL) 25 MG 24 hr tablet TAKE 1 TABLET BY MOUTH EVERY DAY 90 tablet 3   Multiple Vitamin (MULTIVITAMIN WITH MINERALS) TABS tablet Take 1 tablet by mouth daily.     NON FORMULARY CPAP MACHINE     Omega-3 Fatty Acids (FISH OIL) 1000 MG CPDR Take 1,000 mg by mouth daily.      Probiotic Product (PROBIOTIC DAILY PO) Take 1 capsule by mouth  daily.     No current facility-administered medications for this visit.    Allergies as of 07/13/2021 - Review Complete 07/13/2021  Allergen Reaction Noted   Betadine [povidone iodine] Other (See Comments) 12/31/2014   Oxycodone Other (See Comments) 10/03/2015    Vitals: BP 120/63   Pulse 60   Ht 5\' 9"  (1.753 m)   Wt 178 lb (80.7 kg)   BMI 26.29 kg/m  Last Weight:  Wt Readings from Last 1 Encounters:  07/13/21 178 lb (80.7 kg)   Last Height:   Ht Readings from Last 1 Encounters:  07/13/21 5\' 9"  (1.753 m)   Physical exam:  General: The patient is awake, alert and appears not in acute distress. The patient is well groomed. Head: Normocephalic, atraumatic. Neck is supple. Mallampati 2 , neck circumference:16. No retrognathia.  Cardiovascular:  Regular rate and rhythm , without  murmurs or carotid bruit, and without distended neck veins. Respiratory: Lungs are clear to auscultation. Skin:  Without evidence of edema, or rash Trunk: BMI is  normal posture.  Neurologic exam : The patient is awake and alert, oriented to place and time. Speech is fluent without   dysarthria, dysphonia or aphasia. Mood and affect are appropriate. Cranial nerves:  No loss of smell or taste reported.  Pupils are equal and briskly reactive to light.  Extraocular movements without nystagmus. Visual fields by finger perimetry are intact. Hearing to finger rub intact.   Facial sensation intact to fine touch.  Facial motor strength is symmetric and tongue and uvula move midline. Motor exam:   Normal tone and normal muscle bulk . Sensory:  Fine touch, pinprick and vibration were tested in all extremities. Proprioception is  normal.   Assessment:  After physical and neurologic examination, review of laboratory studies, imaging, neurophysiology testing and pre-existing records, assessment :  OSA is best controlled, there is no further improvement likely.  He shall continue to use CPAP ,auto titration 4-8 cm water, 1 cm EPR. The patient may benefit form an increase of 1 cm pressure in the near future.  He has had maximum benefit of apnea treatment on nocturia, insomnia. Completely off Benadryl, now Melatonin.    I will follow yearly- in  person.      Larey Seat, MD  09-11-2020

## 2021-07-13 NOTE — Patient Instructions (Signed)

## 2021-08-07 ENCOUNTER — Telehealth: Payer: Self-pay | Admitting: Cardiology

## 2021-08-07 NOTE — Telephone Encounter (Signed)
Pt c/o medication issue:  1. Name of Medication: Losarten  2. How are you currently taking this medication (dosage and times per day)?    3. Are you having a reaction (difficulty breathing--STAT)? no  4. What is your medication issue? Want to discuss the medicine it was put on. Patient states that he leave out the country for a month the day after thanksgiving. And want to have this resolve before he leaves. Please advise patient wants to speak

## 2021-08-07 NOTE — Telephone Encounter (Signed)
Patient is aware that his mychart message was sent to Dr Martinique for review. Should be able to give you answer this afternoon .   Patient asked if he could get at 15 minute of Dr Martinique time to discuss

## 2021-08-07 NOTE — Telephone Encounter (Signed)
His current BP appears to be perfectly acceptable. I would just continue to monitor but hang on to the losartan in case BP trends up. Agree that losartan would not affect HR. I don't really think he needs a visit prior to travel as BP is in normal range  Vantasia Pinkney Martinique MD, [facility]

## 2021-08-10 NOTE — Telephone Encounter (Signed)
Spoke to patient 08/07/21 Dr.Jordan's advice given.

## 2021-08-10 NOTE — Telephone Encounter (Signed)
See 08/07/21 mychart message.

## 2021-08-18 NOTE — Telephone Encounter (Signed)
I would have him resume lisinopril. Was on 20 mg before.   Saddie Sandeen Martinique MD, [facility]

## 2021-08-26 ENCOUNTER — Telehealth: Payer: Self-pay | Admitting: Cardiology

## 2021-08-26 NOTE — Telephone Encounter (Signed)
Returned call to patient, patient reports concerns with elevated BP.   Reports noticing increased BP yesterday.  Reports yesterday was a big travel day for him, they flew from [place] to [place].   BP has been 130-140s/65-70s for the last 2 days.    Reports stopping losartan 11/22 and restarting lisinopril 20 mg as recommended.  He did take 1/2 of the lisinopril for a couple days.  Started taking a full tablet 20mg  on 11/25.       BP readings in previous message are from today.  Reports he took additional 1/2 Toprol XL this morning as well as a lorazepam.  Most recent reading 141/71 HR 68.   States his normal BP is 115/60s HR 70s.    He denies symptoms but is very concerned.   He states he has additional lisinopril and toprol XL with him (out of country).    He denies increase in sodium intake over his travels.     Advised would route to Dr. Martinique to review.

## 2021-08-26 NOTE — Telephone Encounter (Signed)
Spoke to patient Dr.Jordan's advice given. 

## 2021-08-26 NOTE — Telephone Encounter (Signed)
Pt c/o BP issue: STAT if pt c/o blurred vision, one-sided weakness or slurred speech  1. What are your last 5 BP readings?  08/26/21 10:50 AM 138/68 HR 85 11:00 am 137/81 HR 71 4:15 PM 141/71 HR 68  2. Are you having any other symptoms (ex. Dizziness, headache, blurred vision, passed out)? No   3. What is your BP issue? Hypertension past two days.    Robert [name] is currently in [place] in [place]. He reports his BP has been elevated. He took his medications lisinopril, metoprolol, and diltiazem this morning at 10:40 AM. Due to his hypertension he has taken an additional 1/2 a tablet of metoprolol and a tablet of lorazepam. He states his BP is still currently elevated due to his "4:15 PM" reading being right before this call. He has additional lisinopril he can take due to brining extra for his trip and can also take an extra 1/2 a tablet of metoprolol. He is requesting a callback advising what he should do due to there not really being any medical care he can receive while there. His MyChart is not currently working so he can only be reached via phone call or email. Requesting callback ASAP. Please advise.

## 2021-08-26 NOTE — Telephone Encounter (Signed)
It is not surprising that BP would go up with heavy travel day. He needs to just monitor it for now. Try and relax and enjoy his trip. Get plenty of rest and still eat healthy.  Trivia Heffelfinger Martinique MD, [facility]

## 2021-09-07 IMAGING — CT CT HEART MORP W/ CTA COR W/ SCORE W/ CA W/CM &/OR W/O CM
1 of 13 series · 4 of 20 positions shown, 5 images · non-contrast
Comparison: None.

Addendum:
CLINICAL DATA: Hx of htn, hld obesity, abnormal ETT

EXAM:
Cardiac/Coronary  CTA
TECHNIQUE: The patient was scanned on a Siemens Somatoform go.Top scanner.

[Series 39: ms multiphase cta coronary 0.60 · axial · 0.39mm/px · z∈[-1484,-1412]mm · 4 of 2727 slices shown, 5 images]
[im 546/2727  vessel]
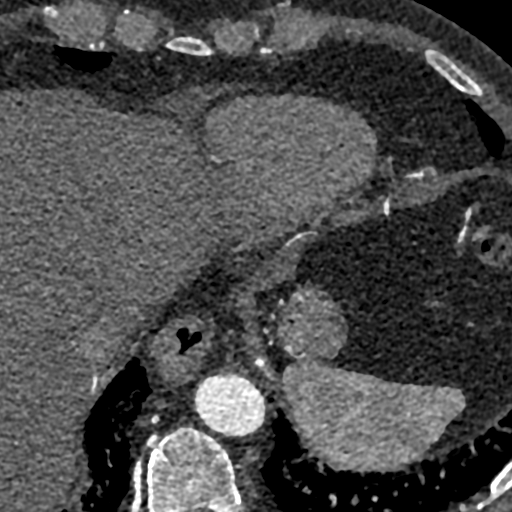
[im 546/2727  lung]
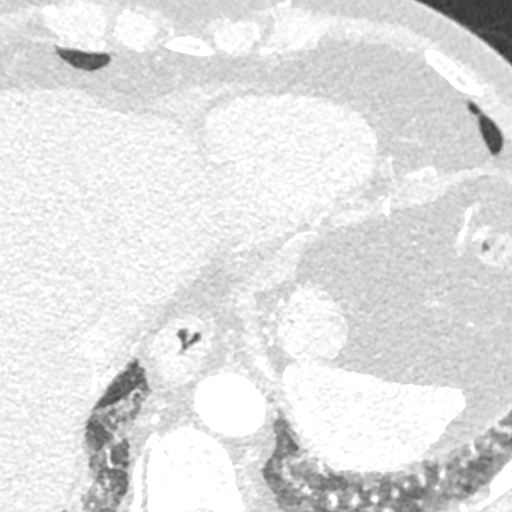
[im 1091/2727  vessel]
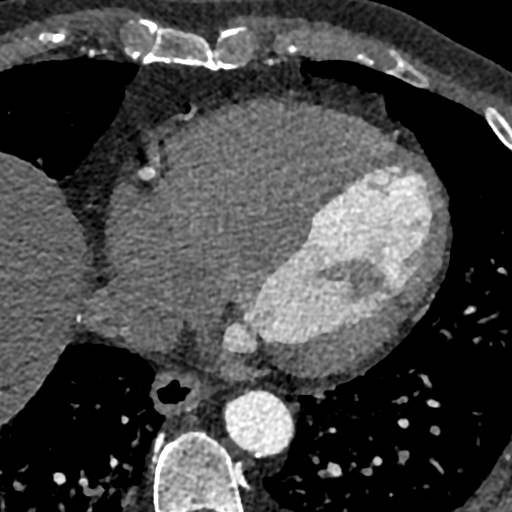
[im 1636/2727  vessel]
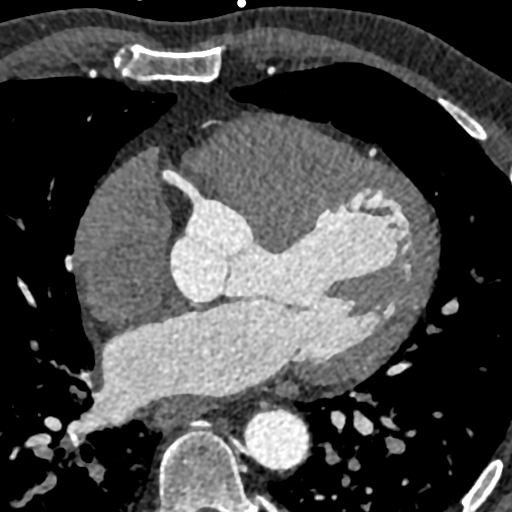
[im 2181/2727  vessel]
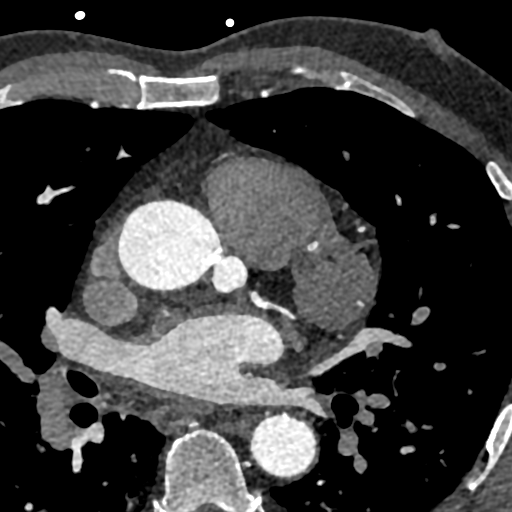

[4 of 20 positions shown; findings below may reference images not displayed]

FINDINGS: A retrospective scan was triggered in the descending thoracic aorta.
Axial non-contrast 3 mm slices were carried out through the heart.
The data set was analyzed on a dedicated work station and scored
using the Agatson method. Gantry rotation speed was 330 msecs and
collimation was .6 mm. 50mg of metoprolol and 0.8 mg of sl NTG was
given. The 3D data set was reconstructed in 5% intervals of the
60-95 % of the R-R cycle. Diastolic phases were analyzed on a
dedicated work station using MPR, MIP and VRT modes. The patient
received 75 cc of contrast.

Aorta: Normal size. Mild aortic wall calcifications noted in the
ascending and descending thoracic segment. No dissection.

Aortic Valve:  Trileaflet.  No calcifications.

Coronary Arteries:  Normal coronary origin.  Right dominance.

RCA is a large dominant artery that gives rise to PDA and PLA. There
is minimal calcified plaque in the mid RCA causing non obstructive
disease (0-24%).

Left main is a large artery that gives rise to LAD and LCX arteries.

LAD is a large vessel that has mild calcified plaque the mid segment
causing minimal nonobstructive disease (0-24%).

LCX is a non-dominant artery that gives rise to 2 obtuse marginal
branches. There is no plaque.

Other findings:

Normal pulmonary vein drainage into the left atrium.

Normal left atrial appendage without a thrombus.

Normal size of the pulmonary artery.
IMPRESSION: 1. Coronary calcium score of 25.7. This was 23rd percentile for age
and sex matched control.

2. Mild ascending and descending thoracic aortic wall calcifications

3. Normal coronary origin with right dominance.

4.  Minimal non obstructive calcified plaque in the mid RCA and LAD

5. CAD-RADS 1. Minimal non-obstructive CAD (0-24%). Consider
non-atherosclerotic causes of chest pain. Consider preventive
therapy and risk factor modification.

EXAM:
OVER-READ INTERPRETATION  CT CHEST

The following report is an over-read performed by radiologist Dr.
does not include interpretation of cardiac or coronary anatomy or
pathology. The coronary CTA interpretation by the cardiologist is
attached.
FINDINGS: Cardiovascular: Mild cardiac enlargement. No pericardial effusion.
Mild aortic atherosclerosis identified.

Mediastinum/nodes: No mass or adenopathy identified.

Lungs/pleura: No pleural effusion or airspace consolidation.
Dependent changes including increased ground-glass attenuation and
interstitial thickening identified in the posterior lung bases.

Upper abdomen: No acute abnormality.

Musculoskeletal: No acute or significant osseous findings.
IMPRESSION: 1. No significant noncardiac findings identified. Dependent type
changes noted within both lung bases.
2.  Aortic Atherosclerosis ([id number]).

*** End of Addendum ***
FINDINGS: A retrospective scan was triggered in the descending thoracic aorta.
Axial non-contrast 3 mm slices were carried out through the heart.
The data set was analyzed on a dedicated work station and scored
using the Agatson method. Gantry rotation speed was 330 msecs and
collimation was .6 mm. 50mg of metoprolol and 0.8 mg of sl NTG was
given. The 3D data set was reconstructed in 5% intervals of the
60-95 % of the R-R cycle. Diastolic phases were analyzed on a
dedicated work station using MPR, MIP and VRT modes. The patient
received 75 cc of contrast.

Aorta: Normal size. Mild aortic wall calcifications noted in the
ascending and descending thoracic segment. No dissection.

Aortic Valve:  Trileaflet.  No calcifications.

Coronary Arteries:  Normal coronary origin.  Right dominance.

RCA is a large dominant artery that gives rise to PDA and PLA. There
is minimal calcified plaque in the mid RCA causing non obstructive
disease (0-24%).

Left main is a large artery that gives rise to LAD and LCX arteries.

LAD is a large vessel that has mild calcified plaque the mid segment
causing minimal nonobstructive disease (0-24%).

LCX is a non-dominant artery that gives rise to 2 obtuse marginal
branches. There is no plaque.

Other findings:

Normal pulmonary vein drainage into the left atrium.

Normal left atrial appendage without a thrombus.

Normal size of the pulmonary artery.
IMPRESSION: 1. Coronary calcium score of 25.7. This was 23rd percentile for age
and sex matched control.

2. Mild ascending and descending thoracic aortic wall calcifications

3. Normal coronary origin with right dominance.

4.  Minimal non obstructive calcified plaque in the mid RCA and LAD

5. CAD-RADS 1. Minimal non-obstructive CAD (0-24%). Consider
non-atherosclerotic causes of chest pain. Consider preventive
therapy and risk factor modification.

## 2021-09-16 ENCOUNTER — Telehealth: Payer: Self-pay | Admitting: Cardiology

## 2021-09-16 DIAGNOSIS — R002 Palpitations: Secondary | ICD-10-CM

## 2021-09-16 NOTE — Telephone Encounter (Signed)
Pt c/o BP issue: STAT if pt c/o blurred vision, one-sided weakness or slurred speech  1. What are your last 5 BP readings? 137/60 , 135/65, 128/65  2. Are you having any other symptoms (ex. Dizziness, headache, blurred vision, passed out)? no  3. What is your BP issue? Pt bp is elevated

## 2021-09-16 NOTE — Telephone Encounter (Signed)
Left message for patient to return the call.

## 2021-09-17 MED ORDER — LOSARTAN POTASSIUM 50 MG PO TABS: 50.0000 mg | ORAL_TABLET | Freq: Every day | ORAL | 1 refills | Status: DC

## 2021-09-17 MED ORDER — METOPROLOL SUCCINATE ER 25 MG PO TB24: 50.0000 mg | ORAL_TABLET | Freq: Every day | ORAL | 1 refills | Status: DC

## 2021-09-17 NOTE — Telephone Encounter (Signed)
Left message for patient that MD has advised med changes. Sent via [place]. Rx(s) sent to [facility]

## 2021-09-17 NOTE — Telephone Encounter (Signed)
Spoke with patient of Dr. Martinique   BP was previously 110/60 and jumped to 135/70 -- he had stopped lisinopril and started losartan around this time. He was then advised to go back to lisinopril (11/11 MyChart message). He went on vacation to [place] and BP still running 135/70 then. He ended up increasing metoprolol from 25mg  to 50mg  daily on his own. This helped and BP decreased to 125/65. He states he "feels blood pressure in his face" when SBP is over 120 - face turns red. He prefers BP to be lower than 125/65.   He reports BP has gone up in the last month with no changes in lifestyle. He reports no additional salt/sodium in his diet.   Questions: He would like to go back to losartan, since lisinopril caused a cough and phlegm in back of throat He would like to know if he should continue increased metoprolol (50mg ) He also states he took hctz in the past and did well with this. He would like to try losartan-hctz (?)   Vitals: 12/22 AM: 128/64 HR 65, 119/65 HR 58 -- only took 1 metoprolol succinate (25mg ) today 12/21 PM: 106/48 HR 65 He reports HR is typically mid-60s  AM meds: lisinopril, diltiazem, metoprolol succinate (had been taking 50mg  up until today, since he was on a trip -- said he will watch BP and may take extra 25mg  of meto succ this afternoon)  He plans to leave on a trip to [place] for 5 days tomorrow and would like advice about meds/BP. He requested an appointment Dr. Martinique or Isaac Laud [place] but advise both out of office and neither back until week of Jan 16.   OK to leave message, send to [place]

## 2021-09-17 NOTE — Telephone Encounter (Signed)
OK to switch lisinopril to losartan. Start at 50 mg daily. Continue metoprolol 50 mg. Monitor for a couple of weeks before making any other changes to make sure we are at equilibrium.  Maykel Reitter Martinique MD, [facility]

## 2021-09-17 NOTE — Addendum Note (Signed)
Addended by: Fidel Levy on: 09/17/2021 10:12 AM   Modules accepted: Orders

## 2021-09-30 ENCOUNTER — Other Ambulatory Visit: Payer: Self-pay | Admitting: Neurology

## 2021-09-30 NOTE — Telephone Encounter (Signed)
Received refill request for lorazepam.  Last OV was on 07/13/21.  Next OV is scheduled for 07/13/22 .  Last RX was written on 08/17/21 for 30 tabs.   [place] Drug Database has been reviewed.

## 2021-10-03 NOTE — Progress Notes (Signed)
Cardiology Office Note:    Date:  10/12/2021   ID:  Robert [name], DOB 06-29-1946, MRN [id number]  PCP:  Ginger Organ., MD  [facility] Cardiologist:  Mylin Hirano Martinique, MD  [facility] Electrophysiologist:  None   Referring MD: Ginger Organ., MD   Chief Complaint  Patient presents with   Hypertension    History of Present Illness:    Robert [name] is a 76 y.o. male with a hx of coronary calcification, OSA on CPAP, HTN and HLD.  There was incidental finding of coronary calcification in mid RCA distribution.  Previous Myoview in June 2014 was negative for ischemia.  EF was normal on the Myoview.  He had history of facial flushing with crestor and pravastatin however able to tolerate Lipitor.  There was concern of bradycardia and fatigue in January 2018.  Toprol dose was reduced with improvement.  Heart monitor showed average heart rate of 64 with lowest heart rate 50.  EGD in June 2018 was normal.  He later developed heart palpitation and went back to prior Toprol-XL dose. He underwent parathyroidectomy for hyperparathyroidism.  Subsequent coronary CT showed minimal nonobstructive disease.  Due to hypotension, hydrochlorothiazide was discontinued.  In December he went on a 3.5 week trip to [place]. Noted BP went up to 150. Was concerned. Noted cough on lisinopril. Was switched to losartan. Since then BP has been well controlled. He is tolerating his medication well. He does note when his BP is up to 135 he feels flushed and doesn't feel as well.    Past Medical History:  Diagnosis Date   Anxiety    Arthritis    Knee , Neck   Colon cancer ([place])    a. s/p colon resection in 0630 and 1601   Complication of anesthesia    difficulty urinating after   Edema of right foot    ? muscle problem to be addressed after left knee TKA on 01/06/2015 per patient    History of kidney stones    Hyperlipidemia    Hypertension    Neuropathy feet bottom   SECONDARY TO  CHEMOTHERAPY   Nocturia associated with benign prostatic hypertrophy 03/21/2013   Palpitations    S/P cardiac catheterization 1995 and 1998   a. following abnormal stress tests. Both reportedly normal;  b. 02/2013 nl myoview.   Sleep apnea with use of continuous positive airway pressure (CPAP)    wears CPAP nightly    Past Surgical History:  Procedure Laterality Date   CALCANEAL OSTEOTOMY Right 04/29/2016   Procedure: RIGHT CALCANEAL OSTEOTOMY, RIGHT MEDIAL CUNEIFORM OSTEOTOMY;  Surgeon: Wylene Simmer, MD;  Location: [place];  Service: Orthopedics;  Laterality: Right;   CARDIAC CATHETERIZATION  07/04/1997   COLON RESECTION  2004   x 2 per patient    COLON SURGERY     colon resection   COLONOSCOPY     ELBOW SURGERY Left 2011   INFECTED OLECRANON BURSITIS SURGERY   FOOT SURGERY Right    GASTROCNEMIUS RECESSION Right 04/29/2016   Procedure: RIGHT GASTROC RECESSION;  Surgeon: Wylene Simmer, MD;  Location: [place];  Service: Orthopedics;  Laterality: Right;   INGUINAL HERNIA REPAIR Right 06/03/2015   Procedure: REPAIR RIGHT INGUINAL HERNIA;  Surgeon: Georganna Skeans, MD;  Location: [place];  Service: General;  Laterality: Right;   INSERTION OF MESH Right 06/03/2015   Procedure: INSERTION OF MESH;  Surgeon: Georganna Skeans, MD;  Location: [place];  Service: General;  Laterality: Right;   JOINT REPLACEMENT Left    L4 DISCECTOMY     PARATHYROIDECTOMY Left 03/16/2019   Procedure: LEFT PARATHYROIDECTOMY;  Surgeon: Armandina Gemma, MD;  [facility];  Service: General;  Laterality: Left;   RETINAL DETACHMENT SURGERY  1998   SEPTIC JOINT Left    Elbow   TOTAL KNEE ARTHROPLASTY Left 01/06/2015   Procedure: LEFT TOTAL KNEE ARTHROPLASTY;  Surgeon: Gaynelle Arabian, MD;  [facility];  Service: Orthopedics;  Laterality: Left;   TOTAL KNEE ARTHROPLASTY Right 10/03/2015   Procedure: RIGHT TOTAL KNEE ARTHROPLASTY;  Surgeon: Gaynelle Arabian, MD;  [facility];  Service: Orthopedics;   Laterality: Right;   TRANSURETHRAL RESECTION OF PROSTATE      Current Medications: Current Meds  Medication Sig   acetaminophen (TYLENOL) 500 MG tablet Take 500 mg by mouth at bedtime.   atorvastatin (LIPITOR) 40 MG tablet TAKE 1 TABLET BY MOUTH EVERY DAY   benzonatate (TESSALON) 100 MG capsule 1 capsule as needed   cholecalciferol (VITAMIN D3) 25 MCG (1000 UT) tablet Take 1,000 Units by mouth daily.   CIALIS 5 MG tablet Take 5 mg by mouth daily at 3 pm.    diltiazem (CARDIZEM CD) 180 MG 24 hr capsule TAKE 1 CAPSULE (180 MG TOTAL) BY MOUTH AT BEDTIME.   ezetimibe (ZETIA) 10 MG tablet Take 1 tablet (10 mg total) by mouth daily.   fluticasone (FLONASE) 50 MCG/ACT nasal spray Place 1 spray into both nostrils daily.    ibuprofen (ADVIL,MOTRIN) 200 MG tablet Take 200 mg by mouth at bedtime.    lidocaine (LMX) 4 % cream Apply 1 application topically at bedtime as needed (foot pain).   Loratadine 10 MG CAPS take one po qd- alernating c fexofendadine.   LORazepam (ATIVAN) 1 MG tablet TAKE 0.5 TABLETS (0.5 MG TOTAL) BY MOUTH 2 (TWO) TIMES DAILY AS NEEDED FOR ANXIETY.   losartan (COZAAR) 50 MG tablet Take 1 tablet (50 mg total) by mouth daily.   metoprolol succinate (TOPROL-XL) 25 MG 24 hr tablet Take 2 tablets (50 mg total) by mouth daily.   Multiple Vitamin (MULTIVITAMIN WITH MINERALS) TABS tablet Take 1 tablet by mouth daily.   NON FORMULARY CPAP MACHINE   Omega-3 Fatty Acids (FISH OIL) 1000 MG CPDR Take 1,000 mg by mouth daily.    Probiotic Product (PROBIOTIC DAILY PO) Take 1 capsule by mouth daily.     Allergies:   Betadine [povidone iodine], Olmesartan, and Oxycodone   Social History   Socioeconomic History   Marital status: Married    Spouse name: [name]   Number of children: 3   Years of education: Ph.D   Highest education level: Not on file  Occupational History   Occupation: ADMIN    Employer: [employer]    Comment: [employer]  Tobacco Use   Smoking status: Never    Smokeless tobacco: Never  Vaping Use   Vaping Use: Never used  Substance and Sexual Activity   Alcohol use: Yes    Alcohol/week: 17.0 standard drinks    Types: 8 Glasses of wine, 8 Shots of liquor, 1 Standard drinks or equivalent per week    Comment: each night 1 glass   Drug use: No   Sexual activity: Yes  Other Topics Concern   Not on file  Social History Narrative   Sleep apnea 28 AHI was reduced to 3.4- set to  7 cm water pressure on nasal mask eson or wisp.  He sleep  nonsupine, less nocturia.  Tennis ball method explained.  Patient does not want machine set to a certain pressure.     Downloaded CMS compliance s etsablsihed .  Insomnia not improved further - will try seroquel as  medication approach and discussed behaviour therapy approach .   send for n beahiour therapy and started trial of SEROQUEL>  25 minute visit including CMS compliance and downloading.    Patient is married [name]) and lives at home with his wife.   Patient has three children and his wife has three children.   Patient is working full-time.   Patient has a Ph.D.   Patient is right-handed.   Patient drinks about 21 oz of coffee daily.         Social Determinants of Health   Financial Resource Strain: Not on file  Food Insecurity: Not on file  Transportation Needs: Not on file  Physical Activity: Not on file  Stress: Not on file  Social Connections: Not on file     Family History: The patient's family history includes Emphysema in his mother; Heart attack in his father; High Cholesterol in an other family member; Hypertension in his father and another family member.  ROS:   Please see the history of present illness.     All other systems reviewed and are negative.  EKGs/Labs/Other Studies Reviewed:    The following studies were reviewed today:  Coronary CT 11/01/2019 IMPRESSION: 1. Coronary calcium score of 25.7. This was 23rd percentile for age and sex matched control.   2. Mild  ascending and descending thoracic aortic wall calcifications   3. Normal coronary origin with right dominance.   4.  Minimal non obstructive calcified plaque in the mid RCA and LAD   5. CAD-RADS 1. Minimal non-obstructive CAD (0-24%). Consider non-atherosclerotic causes of chest pain. Consider preventive therapy and risk factor modification.    EKG:  EKG is not ordered today.    Recent Labs: 03/03/2021: ALT 37; BUN 22; Creatinine, Ser 1.00; Hemoglobin 15.8; Platelets 193; Potassium 4.1; Sodium 138  Recent Lipid Panel    Component Value Date/Time   CHOL 146 01/20/2021 0916   TRIG 109 01/20/2021 0916   HDL 53 01/20/2021 0916   CHOLHDL 2.8 01/20/2021 0916   CHOLHDL 3.5 01/27/2017 0809   VLDL 34 (H) 01/27/2017 0809   LDLCALC 73 01/20/2021 0916   Dated 07/17/21: cholesterol 147, triglycerides 130, HDL 50, LDL 71. CMET and TSH normal.  Physical Exam:    VS:  BP 115/60    Pulse 63    Ht 5\' 9"  (1.753 m)    Wt 179 lb 3.2 oz (81.3 kg)    SpO2 98%    BMI 26.46 kg/m     Wt Readings from Last 3 Encounters:  10/12/21 179 lb 3.2 oz (81.3 kg)  07/13/21 178 lb (80.7 kg)  03/03/21 170 lb (77.1 kg)     GEN:  Well nourished, well developed in no acute distress HEENT: Normal NECK: No JVD; No carotid bruits LYMPHATICS: No lymphadenopathy CARDIAC: RRR, no murmurs, rubs, gallops RESPIRATORY:  Clear to auscultation without rales, wheezing or rhonchi  ABDOMEN: Soft, non-tender, non-distended MUSCULOSKELETAL:  No edema; No deformity  SKIN: Warm and dry NEUROLOGIC:  Alert and oriented x 3 PSYCHIATRIC:  Normal affect   ASSESSMENT:    1. Primary hypertension   2. Coronary artery disease involving native coronary artery of native heart without angina pectoris   3. Coronary artery calcification seen on CAT scan   4. Pure hypercholesterolemia  PLAN:    In order of problems listed above:  Coronary artery calcification: Previous coronary CT showed minimal disease  Hypertension: Blood  pressure is well controlled. We recommend reducing losartan to 25 mg daily now and monitor.   Hyperlipidemia: labs at goal in October.   Medication Adjustments/Labs and Tests Ordered: Current medicines are reviewed at length with the patient today.  Concerns regarding medicines are outlined above.  No orders of the defined types were placed in this encounter.  No orders of the defined types were placed in this encounter.   There are no Patient Instructions on file for this visit.   Signed, Dshawn Mcnay Martinique, MD  10/12/2021 9:53 AM    [facility]

## 2021-10-12 ENCOUNTER — Ambulatory Visit: Payer: Medicare Other | Admitting: Cardiology

## 2021-10-12 ENCOUNTER — Encounter: Payer: Self-pay | Admitting: Cardiology

## 2021-10-12 ENCOUNTER — Other Ambulatory Visit: Payer: Self-pay

## 2021-10-12 VITALS — BP 115/60 | HR 63 | Ht 69.0 in | Wt 179.2 lb

## 2021-10-12 DIAGNOSIS — E7800 Pure hypercholesterolemia, unspecified: Secondary | ICD-10-CM

## 2021-10-12 DIAGNOSIS — I2510 Atherosclerotic heart disease of native coronary artery without angina pectoris: Secondary | ICD-10-CM | POA: Diagnosis not present

## 2021-10-12 DIAGNOSIS — I10 Essential (primary) hypertension: Secondary | ICD-10-CM | POA: Diagnosis not present

## 2021-10-12 NOTE — Patient Instructions (Signed)
Reduce losartan to 25 mg daily

## 2021-10-21 ENCOUNTER — Encounter: Payer: Self-pay | Admitting: Cardiology

## 2021-10-21 NOTE — Telephone Encounter (Signed)
He should stay on losartan and it is OK to continue 50 mg - make sure he has enough on his Rx for when he travels.  Reilly Molchan Martinique MD, [facility]

## 2021-11-06 ENCOUNTER — Encounter: Payer: Self-pay | Admitting: Neurology

## 2021-11-09 ENCOUNTER — Telehealth: Payer: Self-pay | Admitting: Neurology

## 2021-11-09 ENCOUNTER — Encounter: Payer: Self-pay | Admitting: Neurology

## 2021-11-09 NOTE — Telephone Encounter (Signed)
Pt going out of the country tomorrow morning. Need a letter stating why medically necessary for CPAP machine.  Can send to MyChart or can come pick letter up. Would like a call from the nurse.

## 2021-11-09 NOTE — Telephone Encounter (Signed)
Letter has been written for the patient and he ok'd the letter.

## 2021-12-11 ENCOUNTER — Other Ambulatory Visit: Payer: Self-pay | Admitting: Cardiology

## 2021-12-11 ENCOUNTER — Other Ambulatory Visit: Payer: Self-pay | Admitting: Neurology

## 2021-12-11 DIAGNOSIS — R002 Palpitations: Secondary | ICD-10-CM

## 2021-12-14 NOTE — Telephone Encounter (Signed)
Last OV was on 07/13/21.  ?Next OV is scheduled for 07/13/22 .  ?Last RX was written on 11/01/21 for 30 tabs.  ? ?[place] Drug Database has been reviewed.  ?

## 2021-12-18 ENCOUNTER — Encounter: Payer: Self-pay | Admitting: Cardiology

## 2021-12-19 NOTE — Telephone Encounter (Signed)
Per my last note  recommended reducing losartan to 25 mg daily. If that is the case then increase it back to 50 mg daily. If he is on 50 then we can increase to 100 mg daily ? ?Raysean Graumann Martinique MD, [facility] ? ?

## 2021-12-22 DIAGNOSIS — R2689 Other abnormalities of gait and mobility: Secondary | ICD-10-CM | POA: Insufficient documentation

## 2021-12-22 MED ORDER — LOSARTAN POTASSIUM 50 MG PO TABS: ORAL_TABLET | ORAL | 3 refills | Status: DC

## 2021-12-22 NOTE — Telephone Encounter (Signed)
Spoke to patient.Stated he has been taking Losartan 50 mg daily.Dr.Jordan advised to increase to 100 mg daily.Advised to monitor B/P and send readings in 2 weeks. ?

## 2022-02-03 ENCOUNTER — Encounter: Payer: Self-pay | Admitting: Cardiology

## 2022-02-04 NOTE — Telephone Encounter (Signed)
All sounds good. Continue current therapy ? ?Robert [name] Martinique MD, [facility] ? ?

## 2022-02-12 NOTE — Telephone Encounter (Signed)
Spoke to patient Dr.Jordan advised continue same medications.Follow up appointment scheduled with Dr.Jordan 04/05/22 1:00 pm.

## 2022-02-17 ENCOUNTER — Other Ambulatory Visit: Payer: Self-pay | Admitting: General Surgery

## 2022-02-17 ENCOUNTER — Other Ambulatory Visit (HOSPITAL_COMMUNITY): Payer: Self-pay | Admitting: General Surgery

## 2022-02-17 DIAGNOSIS — R1031 Right lower quadrant pain: Secondary | ICD-10-CM

## 2022-02-18 ENCOUNTER — Ambulatory Visit (HOSPITAL_COMMUNITY)
Admission: RE | Admit: 2022-02-18 | Discharge: 2022-02-18 | Disposition: A | Discharge status: Home / Self Care | Payer: Medicare Other | Source: Ambulatory Visit | Attending: General Surgery | Admitting: General Surgery

## 2022-02-18 DIAGNOSIS — R1031 Right lower quadrant pain: Secondary | ICD-10-CM | POA: Insufficient documentation

## 2022-02-18 LAB — POCT I-STAT CREATININE: Creatinine, Ser: 0.9 mg/dL (ref 0.61–1.24)

## 2022-02-18 MED ORDER — SODIUM CHLORIDE (PF) 0.9 % IJ SOLN
INTRAMUSCULAR | Status: AC
  Filled 2022-02-18: qty 50

## 2022-02-18 MED ORDER — IOHEXOL 300 MG/ML  SOLN
100.0000 mL | Freq: Once | INTRAMUSCULAR | Status: AC | PRN
  Administered 2022-02-18: 100 mL via INTRAVENOUS

## 2022-03-04 ENCOUNTER — Ambulatory Visit: Payer: Medicare Other | Attending: General Surgery | Admitting: Physical Therapy

## 2022-03-04 DIAGNOSIS — M62838 Other muscle spasm: Secondary | ICD-10-CM

## 2022-03-04 DIAGNOSIS — M6281 Muscle weakness (generalized): Secondary | ICD-10-CM | POA: Diagnosis present

## 2022-03-04 NOTE — Therapy (Signed)
OUTPATIENT PHYSICAL THERAPY MALE PELVIC EVALUATION   Patient Name: Robert [name] MRN: [id number] DOB:01-Jan-1946, 76 y.o., male Today's Date: 03/04/2022   PT End of Session - 03/04/22 1014     Visit Number 1    Date for PT Re-Evaluation 05/04/22    Authorization Type [employer] medicare    Progress Note Due on Visit 10    PT Start Time 1015    PT Stop Time 1048   pt denied additional needs   PT Time Calculation (min) 33 min    Activity Tolerance Patient tolerated treatment well    Behavior During Therapy [facility] for tasks assessed/performed             Past Medical History:  Diagnosis Date   Anxiety    Arthritis    Knee , Neck   Colon cancer ([place])    a. s/p colon resection in 3818 and 2993   Complication of anesthesia    difficulty urinating after   Edema of right foot    ? 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 following abnormal stress tests. Both reportedly normal;  b. 02/2013 nl myoview.   Sleep apnea with use of continuous positive airway pressure (CPAP)    wears CPAP nightly   Past Surgical History:  Procedure Laterality Date   CALCANEAL OSTEOTOMY Right 04/29/2016   Procedure: RIGHT CALCANEAL OSTEOTOMY, RIGHT MEDIAL CUNEIFORM OSTEOTOMY;  Surgeon: Wylene Simmer, MD;  Location: [place];  Service: Orthopedics;  Laterality: Right;   CARDIAC CATHETERIZATION  07/04/1997   COLON RESECTION  2004   x 2 per patient    COLON SURGERY     colon resection   COLONOSCOPY     ELBOW SURGERY Left 2011   INFECTED OLECRANON BURSITIS SURGERY   FOOT SURGERY Right    GASTROCNEMIUS RECESSION Right 04/29/2016   Procedure: RIGHT GASTROC RECESSION;  Surgeon: Wylene Simmer, MD;  Location: [place];  Service: Orthopedics;  Laterality: Right;   INGUINAL HERNIA REPAIR Right 06/03/2015   Procedure: REPAIR RIGHT INGUINAL HERNIA;  Surgeon: Georganna Skeans, MD;  Location: [place];  Service: General;  Laterality: Right;   INSERTION OF MESH Right 06/03/2015   Procedure: INSERTION OF MESH;  Surgeon: Georganna Skeans, MD;  Location: [place];  Service: General;  Laterality: Right;   JOINT REPLACEMENT Left    L4 DISCECTOMY     PARATHYROIDECTOMY Left 03/16/2019   Procedure: LEFT PARATHYROIDECTOMY;  Surgeon: Armandina Gemma, MD;  [facility];  Service: General;  Laterality: Left;   RETINAL DETACHMENT SURGERY  1998   SEPTIC JOINT Left    Elbow   TOTAL KNEE ARTHROPLASTY Left 01/06/2015   Procedure: LEFT TOTAL KNEE ARTHROPLASTY;  Surgeon: Gaynelle Arabian, MD;  [facility];  Service: Orthopedics;  Laterality: Left;   TOTAL KNEE ARTHROPLASTY Right 10/03/2015   Procedure: RIGHT TOTAL KNEE ARTHROPLASTY;  Surgeon: Gaynelle Arabian, MD;  [facility];  Service: Orthopedics;  Laterality: Right;   TRANSURETHRAL RESECTION OF PROSTATE     Patient Active Problem List   Diagnosis Date Noted   History of nocturia 09/12/2019   Hyperparathyroidism, primary ([place]) 02/20/2019   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  Nocturia more than twice per night 04/24/2015   OA (osteoarthritis) of knee 01/06/2015   OSA on CPAP 03/20/2014   Palpitations 10/23/2013   Nocturia associated with benign prostatic hypertrophy 03/21/2013   Sleep apnea with use of continuous positive airway pressure (CPAP)    Coronary artery calcification seen on CAT scan 05/25/2012   Tachycardia 12/08/2011   Hypertension    Hyperlipidemia     PCP: Ginger Organ., MD   REFERRING PROVIDER: Georganna Skeans, MD  REFERRING DIAG: R10.30 (ICD-10-CM) - Inguinal pain  THERAPY DIAG:  Muscle weakness (generalized)  Other muscle spasm  Rationale for Evaluation and Treatment Rehabilitation  ONSET DATE: 2016 but didn't have  symptoms since trip to [place]   SUBJECTIVE:                                                                                                                                                                                           SUBJECTIVE STATEMENT: Right hernia pain, started with traveling to [place] and lifting suitcases a lot. In the past 2 weeks not as much pain with this. Pt does have sciatica did have PT for this and it has really helped.  Patient confirms identification and approves PT to assess pelvic floor and treatment Yes   PAIN:  Are you having pain? No   PRECAUTIONS: None  WEIGHT BEARING RESTRICTIONS No  FALLS:  Has patient fallen in last 6 months? No  LIVING ENVIRONMENT: Lives with: lives with their family Lives in: House/apartment   OCCUPATION: retired  PLOF: Independent  PATIENT GOALS to learn ways to lift and strengthen core  PERTINENT HISTORY:  colon CA, sleep apnea, posterior tibialis tendon insufficiency, PTTD, Rt  posterior tibialis ruture, bil knee replacement, scoliosis Sexual abuse: NO  OBJECTIVE:   DIAGNOSTIC FINDINGS:    COGNITION:  Overall cognitive status: Within functional limits for tasks assessed     SENSATION:  Light touch: Appears intact  Proprioception: Appears intact  MUSCLE LENGTH: WFL  LUMBAR SPECIAL TESTS:  WFL  FUNCTIONAL TESTS:  WFL  GAIT: WFL  POSTURE:  WFL  LUMBARAROM/PROM  Mild restriction in rotation and side bending by 25%; all others WFL  LOWER EXTREMITY AROM/PROM:  WFL  LOWER EXTREMITY MMT:  Bil hip abduction 4+/5; all other LE 5/5    PALPATION: GENERAL no TTP    TODAY'S TREATMENT  03/04/2022 EVAL Examination completed, findings reviewed, pt educated on POC, HEP. Pt motivated to participate in PT and agreeable to attempt recommendations.     PATIENT EDUCATION:  Education details: [id number] Person educated: Patient Education method: Explanation, Demonstration, Tactile cues,  Verbal cues, and Handouts Education  comprehension: verbalized understanding and returned demonstration   HOME EXERCISE PROGRAM: [id number]  ASSESSMENT:  CLINICAL IMPRESSION: Patient is a 76 y.o. male  who was seen today for physical therapy evaluation and treatment for hernia pain. Pt presents to clinic reporting he is no longer having hernia pain only had it with lifting suitcases with recent vacation but this has resolved. Pt has had PT for sciatica and continues to do these exercises but wanted to make sure he shouldn't be doing something else. Pt demonstrated good functional mobility, denied pain with mobility or palpation. Pt given HEP for core strengthening and discussed proper lifting and breathing mechanics to decrease strain at abdomen and hernia.  Pt denied questions or additional concerns and requested to try these exercises and make sure pain does not return in the coming weeks then only return if needed to PT.    OBJECTIVE IMPAIRMENTS decreased strength and improper body mechanics.   ACTIVITY LIMITATIONS lifting  PARTICIPATION LIMITATIONS: community activity  PERSONAL FACTORS Age are also affecting patient's functional outcome.   REHAB POTENTIAL: Excellent  CLINICAL DECISION MAKING: Stable/uncomplicated  EVALUATION COMPLEXITY: Low   GOALS: Goals reviewed with patient? Yes  SHORT TERM GOALS: Target date: 04/01/2022   Pt to be I with HEP.  Baseline: Goal status: INITIAL    LONG TERM GOALS: Target date: 05/04/2022   Pt to be I with advanced HEP.  Baseline:  Goal status: INITIAL  2.  Pt to demonstrate proper lifting mechanics of at least 20# for decrease risk of injury.  Baseline:  Goal status: INITIAL  3.  Pt to demonstrated 5/5 bil hip strength for improved stability and decreased risk of injury.  Baseline:  Goal status: INITIAL     PLAN: PT FREQUENCY:  once every other week for 3 visits  PT DURATION:  3 sessions  PLANNED INTERVENTIONS: Therapeutic  exercises, Therapeutic activity, Neuromuscular re-education, Patient/Family education, Joint mobilization, Spinal mobilization, Cryotherapy, Moist heat, scar mobilization, Taping, and Manual therapy  PLAN FOR NEXT SESSION: core and hip strengthening Stacy Gardner, PT, DPT 03/04/2309:51 AM

## 2022-03-11 ENCOUNTER — Other Ambulatory Visit: Payer: Self-pay | Admitting: Cardiology

## 2022-03-25 ENCOUNTER — Telehealth: Payer: Self-pay

## 2022-03-25 NOTE — Telephone Encounter (Signed)
   Name: Robert [name]  DOB: 10-30-1945  MRN: [id number]  Primary Cardiologist: Peter Martinique, MD  Chart reviewed as part of pre-operative protocol coverage. Because of [name]'s past medical history and time since last visit, he will require a follow-up tele visit in order to better assess preoperative cardiovascular risk.  Pre-op covering staff: - Please schedule appointment and call patient to inform them. If patient already had an upcoming appointment within acceptable timeframe, please add "pre-op clearance" to the appointment notes so provider is aware. - Please contact requesting surgeon's office via preferred method (i.e, phone, fax) to inform them of need for appointment prior to surgery.  Not on any antiplatelet or anticoagulation therapy.  Elgie Collard, PA-C  03/25/2022, 2:24 PM

## 2022-03-25 NOTE — Telephone Encounter (Signed)
   Pre-operative Risk Assessment    Patient Name: Robert [name]  DOB: 11-08-1945 MRN: [id number]      Request for Surgical Clearance    Procedure:   Right Thumb [facility] Arthroplasty with Double Tendon Transfer and Repair Reconstruction and Tight Rope Suspension as Necessary.  Date of Surgery:  Clearance 04/16/22                                 Surgeon:  Dr. Roseanne [name] Surgeon's Group or Practice Name:  [name] Phone number:  [phone] Fax number:  [phone] Robert [name]   Type of Clearance Requested:   - Medical    Type of Anesthesia:  Not Indicated Anestes  Additional requests/questions:  Please advise surgeon/provider what medications should be held.  Signed, Robert [name]   03/25/2022, 1:56 PM

## 2022-03-25 NOTE — Telephone Encounter (Signed)
Pt already scheduled for a in-office appointment with Dr. Martinique, 04/05/22, clearance will be addressed at that time.  Will route back to requesting surgeon's office to make them aware.

## 2022-04-02 NOTE — Progress Notes (Unsigned)
Cardiology Office Note:    Date:  04/02/2022   ID:  Robert [name], DOB [birth year], MRN [id number]  PCP:  Robert Polka., MD  [facility] Cardiologist:  Robert Hangartner [place], MD  [facility] Electrophysiologist:  None   Referring MD: Robert Polka., MD   No chief complaint on file.   History of Present Illness:    Robert [name] is a 76 y.o. male with a hx of coronary calcification, OSA on CPAP, HTN and HLD.  Needs clearance for thumb surgery. There was incidental finding of coronary calcification in mid RCA distribution.  Previous Myoview in June 2014 was negative for ischemia.  EF was normal on the Myoview.  He had history of facial flushing with crestor and pravastatin however able to tolerate Lipitor.  There was concern of bradycardia and fatigue in January 2018.  Toprol dose was reduced with improvement.  Heart monitor showed average heart rate of 64 with lowest heart rate 50.  EGD in June 2018 was normal.  He later developed heart palpitation and went back to prior Toprol-XL dose. He underwent parathyroidectomy for hyperparathyroidism.  Subsequent coronary CT showed minimal nonobstructive disease.  Due to hypotension, hydrochlorothiazide was discontinued.  In December he went on a 3.5 week trip to [place]. Noted BP went up to 150. Was concerned. Noted cough on lisinopril. Was switched to losartan. Since then BP has been well controlled. He is tolerating his medication well. He does note when his BP is up to 135 he feels flushed and doesn't feel as well.    Past Medical History:  Diagnosis Date   Anxiety    Arthritis    Knee , Neck   Colon cancer (HCC)    a. s/p colon resection in 2004 and 2002   Complication of anesthesia    difficulty urinating after   Edema of right foot    ? muscle problem to be addressed after left knee TKA on 01/06/2015 per patient    History of kidney stones    Hyperlipidemia    Hypertension    Neuropathy feet bottom   SECONDARY TO  CHEMOTHERAPY   Nocturia associated with benign prostatic hypertrophy 03/21/2013   Palpitations    S/P cardiac catheterization 1995 and 1998   a. following abnormal stress tests. Both reportedly normal;  b. 02/2013 nl myoview.   Sleep apnea with use of continuous positive airway pressure (CPAP)    wears CPAP nightly    Past Surgical History:  Procedure Laterality Date   CALCANEAL OSTEOTOMY Right 04/29/2016   Procedure: RIGHT CALCANEAL OSTEOTOMY, RIGHT MEDIAL CUNEIFORM OSTEOTOMY;  Surgeon: Toni Arthurs, MD;  Location: [facility];  Service: Orthopedics;  Laterality: Right;   CARDIAC CATHETERIZATION  07/04/1997   COLON RESECTION  2004   x 2 per patient    COLON SURGERY     colon resection   COLONOSCOPY     ELBOW SURGERY Left 2011   INFECTED OLECRANON BURSITIS SURGERY   FOOT SURGERY Right    GASTROCNEMIUS RECESSION Right 04/29/2016   Procedure: RIGHT GASTROC RECESSION;  Surgeon: Toni Arthurs, MD;  Location:  SURGERY CENTER;  Service: Orthopedics;  Laterality: Right;   INGUINAL HERNIA REPAIR Right 06/03/2015   Procedure: REPAIR RIGHT INGUINAL HERNIA;  Surgeon: Violeta Gelinas, MD;  Location: [facility];  Service: General;  Laterality: Right;   INSERTION OF MESH Right 06/03/2015   Procedure: INSERTION OF MESH;  Surgeon: Violeta Gelinas, MD;  Location: [facility] OR;  Service: General;  Laterality: Right;   JOINT REPLACEMENT Left    L4 DISCECTOMY     PARATHYROIDECTOMY Left 03/16/2019   Procedure: LEFT PARATHYROIDECTOMY;  Surgeon: Darnell Level, MD;  Location: [facility];  Service: General;  Laterality: Left;   RETINAL DETACHMENT SURGERY  1998   SEPTIC JOINT Left    Elbow   TOTAL KNEE ARTHROPLASTY Left 01/06/2015   Procedure: LEFT TOTAL KNEE ARTHROPLASTY;  Surgeon: Ollen Gross, MD;  Location: [facility];  Service: Orthopedics;  Laterality: Left;   TOTAL KNEE ARTHROPLASTY Right 10/03/2015   Procedure: RIGHT TOTAL KNEE ARTHROPLASTY;  Surgeon: Ollen Gross, MD;  Location: [facility];  Service: Orthopedics;   Laterality: Right;   TRANSURETHRAL RESECTION OF PROSTATE      Current Medications: No outpatient medications have been marked as taking for the 04/05/22 encounter (Appointment) with [name], Aleshka Corney M, MD.     Allergies:   Betadine [povidone iodine], Olmesartan, and Oxycodone   Social History   Socioeconomic History   Marital status: Married    Spouse name: Robert [name]   Number of children: 3   Years of education: Ph.D   Highest education level: Not on file  Occupational History   Occupation: ADMIN    Employer: [employer]    Comment: [employer]  Tobacco Use   Smoking status: Never   Smokeless tobacco: Never  Vaping Use   Vaping Use: Never used  Substance and Sexual Activity   Alcohol use: Yes    Alcohol/week: 17.0 standard drinks of alcohol    Types: 8 Glasses of wine, 8 Shots of liquor, 1 Standard drinks or equivalent per week    Comment: each night 1 glass   Drug use: No   Sexual activity: Yes  Other Topics Concern   Not on file  Social History Narrative   Sleep apnea 28 AHI was reduced to 3.4- set to  7 cm water pressure on nasal mask eson or wisp.  He sleep  nonsupine, less nocturia.  Tennis ball method explained.  Patient does not want machine set to a certain pressure.     Downloaded CMS compliance s etsablsihed .  Insomnia not improved further - will try seroquel as  medication approach and discussed behaviour therapy approach .   send for n beahiour therapy and started trial of SEROQUEL>  25 minute visit including CMS compliance and downloading.    Patient is married Robert [name]) and lives at home with his wife.   Patient has three children and his wife has three children.   Patient is working full-time.   Patient has a Ph.D.   Patient is right-handed.   Patient drinks about 21 oz of coffee daily.         Social Determinants of Health   Financial Resource Strain: Not on file  Food Insecurity: Not on file  Transportation Needs: Not on file  Physical Activity:  Not on file  Stress: Not on file  Social Connections: Not on file     Family History: The patient's family history includes Emphysema in his mother; Heart attack in his father; High Cholesterol in an other family member; Hypertension in his father and another family member.  ROS:   Please see the history of present illness.     All other systems reviewed and are negative.  EKGs/Labs/Other Studies Reviewed:    The following studies were reviewed today:  Coronary CT 11/01/2019 IMPRESSION: 1. Coronary calcium score of 25.7. This was 23rd percentile for age and sex matched control.   2.  Mild ascending and descending thoracic aortic wall calcifications   3. Normal coronary origin with right dominance.   4.  Minimal non obstructive calcified plaque in the mid RCA and LAD   5. CAD-RADS 1. Minimal non-obstructive CAD (0-24%). Consider non-atherosclerotic causes of chest pain. Consider preventive therapy and risk factor modification.    EKG:  EKG is not ordered today.    Recent Labs: 02/18/2022: Creatinine, Ser 0.90  Recent Lipid Panel    Component Value Date/Time   CHOL 146 01/20/2021 0916   TRIG 109 01/20/2021 0916   HDL 53 01/20/2021 0916   CHOLHDL 2.8 01/20/2021 0916   CHOLHDL 3.5 01/27/2017 0809   VLDL 34 (H) 01/27/2017 0809   LDLCALC 73 01/20/2021 0916   Dated 07/17/21: cholesterol 147, triglycerides 130, HDL 50, LDL 71. CMET and TSH normal.  Physical Exam:    VS:  There were no vitals taken for this visit.    Wt Readings from Last 3 Encounters:  10/12/21 179 lb 3.2 oz (81.3 kg)  07/13/21 178 lb (80.7 kg)  03/03/21 170 lb (77.1 kg)     GEN:  Well nourished, well developed in no acute distress HEENT: Normal NECK: No JVD; No carotid bruits LYMPHATICS: No lymphadenopathy CARDIAC: RRR, no murmurs, rubs, gallops RESPIRATORY:  Clear to auscultation without rales, wheezing or rhonchi  ABDOMEN: Soft, non-tender, non-distended MUSCULOSKELETAL:  No edema; No  deformity  SKIN: Warm and dry NEUROLOGIC:  Alert and oriented x 3 PSYCHIATRIC:  Normal affect   ASSESSMENT:    No diagnosis found.   PLAN:    In order of problems listed above:  Coronary artery calcification: Previous coronary CT showed minimal disease  Hypertension: Blood pressure is well controlled. We recommend reducing losartan to 25 mg daily now and monitor.   Hyperlipidemia: labs at goal in October.   Medication Adjustments/Labs and Tests Ordered: Current medicines are reviewed at length with the patient today.  Concerns regarding medicines are outlined above.  No orders of the defined types were placed in this encounter.  No orders of the defined types were placed in this encounter.   There are no Patient Instructions on file for this visit.   Signed, Perlita Forbush [place], MD  04/02/2022 2:30 PM    [facility]

## 2022-04-04 ENCOUNTER — Other Ambulatory Visit: Payer: Self-pay | Admitting: Neurology

## 2022-04-05 ENCOUNTER — Ambulatory Visit: Payer: Medicare Other | Admitting: Cardiology

## 2022-04-05 ENCOUNTER — Encounter: Payer: Self-pay | Admitting: Cardiology

## 2022-04-05 VITALS — BP 116/66 | HR 50 | Ht 69.0 in | Wt 177.2 lb

## 2022-04-05 DIAGNOSIS — I2510 Atherosclerotic heart disease of native coronary artery without angina pectoris: Secondary | ICD-10-CM

## 2022-04-05 DIAGNOSIS — I10 Essential (primary) hypertension: Secondary | ICD-10-CM

## 2022-04-05 DIAGNOSIS — E7800 Pure hypercholesterolemia, unspecified: Secondary | ICD-10-CM

## 2022-04-05 DIAGNOSIS — I2584 Coronary atherosclerosis due to calcified coronary lesion: Secondary | ICD-10-CM | POA: Diagnosis not present

## 2022-04-05 NOTE — Telephone Encounter (Signed)
Last OV was on 07/13/21.  Next OV is scheduled for 07/13/22.  Last RX was written on 02/04/22 for 30 tabs.   [place] [facility] has been reviewed.

## 2022-04-07 ENCOUNTER — Other Ambulatory Visit: Payer: Self-pay | Admitting: Neurology

## 2022-04-12 ENCOUNTER — Other Ambulatory Visit: Payer: Self-pay | Admitting: Neurology

## 2022-04-12 NOTE — Telephone Encounter (Signed)
Was sent last week but transmission failed.  Please e-scribe.  Last OV was on 07/13/21.  Next OV is scheduled for 07/13/22.  Last RX was written on 02/04/22 for 30 tabs.   [place] [facility] has been reviewed. Ok to fill

## 2022-04-13 ENCOUNTER — Encounter: Payer: Self-pay | Admitting: Cardiology

## 2022-04-14 ENCOUNTER — Other Ambulatory Visit: Payer: Self-pay

## 2022-04-29 DIAGNOSIS — M1811 Unilateral primary osteoarthritis of first carpometacarpal joint, right hand: Secondary | ICD-10-CM | POA: Insufficient documentation

## 2022-05-09 ENCOUNTER — Other Ambulatory Visit: Payer: Self-pay | Admitting: Cardiology

## 2022-05-09 DIAGNOSIS — R002 Palpitations: Secondary | ICD-10-CM

## 2022-06-09 ENCOUNTER — Other Ambulatory Visit: Payer: Self-pay | Admitting: Cardiology

## 2022-06-15 ENCOUNTER — Ambulatory Visit: Payer: Medicare Other | Admitting: Neurology

## 2022-06-15 VITALS — BP 114/51 | HR 55 | Ht 69.0 in | Wt 181.4 lb

## 2022-06-15 DIAGNOSIS — G629 Polyneuropathy, unspecified: Secondary | ICD-10-CM | POA: Insufficient documentation

## 2022-06-15 NOTE — Patient Instructions (Addendum)
There are well-accepted and sensible ways to reduce risk for Alzheimers disease and other degenerative brain disorders .  Exercise Daily Walk A daily 20 minute walk should be part of your routine. Disease related apathy can be a significant roadblock to exercise and the only way to overcome this is to make it a daily routine and perhaps have a reward at the end (something your loved one loves to eat or drink perhaps) or a personal trainer coming to the home can also be very useful. Most importantly, the patient is much more likely to exercise if the caregiver / spouse does it with him/her. In general a structured, repetitive schedule is best.  General Health: Any diseases which effect your body will effect your brain such as a pneumonia, urinary infection, blood clot, heart attack or stroke. Keep contact with your primary care doctor for regular follow ups.  Sleep. A good nights sleep is healthy for the brain. Seven hours is recommended. If you have insomnia or poor sleep habits we can give you some instructions. If you have sleep apnea wear your mask.  Diet: Eating a heart healthy diet is also a good idea; fish and poultry instead of red meat, nuts (mostly non-peanuts), vegetables, fruits, olive oil or canola oil (instead of butter), minimal salt (use other spices to flavor foods), whole grain rice, bread, cereal and pasta and wine in moderation.Research is now showing that the MIND diet, which is a combination of The Mediterranean diet and the DASH diet, is beneficial for cognitive processing and longevity. Information about this diet can be found in The MIND Diet, a book by Maggie Moon, MS, RDN, and online at https://www.healthline.com/nutrition/mind-diet  Finances, Power of Attorney and Advance Directives: You should consider putting legal safeguards in place with regard to financial and medical decision making. While the spouse always has power of attorney for medical and financial issues in the  absence of any form, you should consider what you want in case the spouse / caregiver is no longer around or capable of making decisions.   The Alzheimers Association Position on Disease Prevention  Can Alzheimer's be prevented? It's a question that continues to intrigue researchers and fuel new investigations. There are no clear-cut answers yet -- partially due to the need for more large-scale studies in diverse populations -- but promising research is under way. The Alzheimer's Association is leading the worldwide effort to find a treatment for Alzheimer's, delay its onset and prevent it from developing.   What causes Alzheimer's? Experts agree that in the vast majority of cases, Alzheimer's, like other common chronic conditions, probably develops as a result of complex interactions among multiple factors, including age, genetics, environment, lifestyle and coexisting medical conditions. Although some risk factors -- such as age or genes -- cannot be changed, other risk factors -- such as high blood pressure and lack of exercise -- usually can be changed to help reduce risk. Research in these areas may lead to new ways to detect those at highest risk.  Prevention studies A small percentage of people with Alzheimer's disease (less than 1 percent) have an early-onset type associated with genetic mutations. Individuals who have these genetic mutations are guaranteed to develop the disease. An ongoing clinical trial conducted by the [facility] ([facility]), is testing whether antibodies to beta-amyloid can reduce the accumulation of beta-amyloid plaque in the brains of people with such genetic mutations and thereby reduce, delay or prevent symptoms. Participants in the trial are receiving antibodies (  or placebo) before they develop symptoms, and the development of beta-amyloid plaques is being monitored by brain scans and other tests.  Another clinical trial, known as the A4 trial  (Anti-Amyloid Treatment in Asymptomatic Alzheimer's), is testing whether antibodies to beta-amyloid can reduce the risk of Alzheimer's disease in older people (ages 65 to 85) at high risk for the disease. The A4 trial is being conducted by the [facility]'s [facility].  Though research is still evolving, evidence is strong that people can reduce their risk by making key lifestyle changes, including participating in regular activity and maintaining good heart health. Based on this research, the Alzheimer's Association offers 10 Ways to Love Your Brain -- a collection of tips that can reduce the risk of cognitive decline.  Heart-head connection  New research shows there are things we can do to reduce the risk of mild cognitive impairment and dementia.  Several conditions known to increase the risk of cardiovascular disease -- such as high blood pressure, diabetes and high cholesterol -- also increase the risk of developing Alzheimer's. Some autopsy studies show that as many as 80 percent of individuals with Alzheimer's disease also have cardiovascular disease.  A longstanding question is why some people develop hallmark Alzheimer's plaques and tangles but do not develop the symptoms of Alzheimer's. Vascular disease may help researchers eventually find an answer. Some autopsy studies suggest that plaques and tangles may be present in the brain without causing symptoms of cognitive decline unless the brain also shows evidence of vascular disease. More research is needed to better understand the link between vascular health and Alzheimer's.  Physical exercise and diet Regular physical exercise may be a beneficial strategy to lower the risk of Alzheimer's and vascular dementia. Exercise may directly benefit brain cells by increasing blood and oxygen flow in the brain. Because of its known cardiovascular benefits, a medically approved exercise program is a valuable part of any overall wellness  plan.  Current evidence suggests that heart-healthy eating may also help protect the brain. Heart-healthy eating includes limiting the intake of sugar and saturated fats and making sure to eat plenty of fruits, vegetables, and whole grains. No one diet is best. Two diets that have been studied and may be beneficial are the DASH (Dietary Approaches to Stop Hypertension) diet and the Mediterranean diet. The DASH diet emphasizes vegetables, fruits and fat-free or low-fat dairy products; includes whole grains, fish, poultry, beans, seeds, nuts and vegetable oils; and limits sodium, sweets, sugary beverages and red meats. A Mediterranean diet includes relatively little red meat and emphasizes whole grains, fruits and vegetables, fish and shellfish, and nuts, olive oil and other healthy fats.  Social connections and intellectual activity A number of studies indicate that maintaining strong social connections and keeping mentally active as we age might lower the risk of cognitive decline and Alzheimer's. Experts are not certain about the reason for this association. It may be due to direct mechanisms through which social and mental stimulation strengthen connections between nerve cells in the brain.  Head trauma There appears to be a strong link between future risk of Alzheimer's and serious head trauma, especially when injury involves loss of consciousness. You can help reduce your risk of Alzheimer's by protecting your head.  Wear a seat belt  Use a helmet when participating in sports  "Fall-proof" your home   What you can do now While research is not yet conclusive, certain lifestyle choices, such as physical activity and diet, may help support brain   health and prevent Alzheimer's. Many of these lifestyle changes have been shown to lower the risk of other diseases, like heart disease and diabetes, which have been linked to Alzheimer's. With few drawbacks and plenty of known benefits, healthy lifestyle  choices can improve your health and possibly protect your brain.  Learn more about brain health. You can help increase our knowledge by considering participation in a clinical study. Our free clinical trial matching services, Trial[employer], can help you find clinical trials in your area that are seeking volunteers.  Understanding prevention research Here are some things to keep in mind about the research underlying much of our current knowledge about possible prevention:  Insights about potentially modifiable risk factors apply to large population groups, not to individuals. Studies can show that factor X is associated with outcome Y, but cannot guarantee that any specific person will have that outcome. As a result, you can "do everything right" and still have a serious health problem or "do everything wrong" and live to be 100.  Much of our current evidence comes from large epidemiological studies such as the [place] Aging Study, the Nurses' Health Study, the Adult Changes in Thought Study and the [employer] These studies explore pre-existing behaviors and use statistical methods to relate those behaviors to health outcomes. This type of study can show an "association" between a factor and an outcome but cannot "prove" cause and effect. This is why we describe evidence based on these studies with such language as "suggests," "may show," "might protect," and "is associated with."  The gold standard for showing cause and effect is a clinical trial in which participants are randomly assigned to a prevention or risk management strategy or a control group. Researchers follow the two groups over time to see if their outcomes differ significantly.  It is unlikely that some prevention or risk management strategies will ever be tested in randomized trials for ethical or practical reasons. One example is exercise. Definitively testing the impact of exercise on Alzheimer's risk would require a huge  trial enrolling thousands of people and following them for many years. The expense and logistics of such a trial would be prohibitive, and it would require some people to go without exercise, a known health benefit.     Gastroesophageal Reflux Disease, Adult  Gastroesophageal reflux (GER) happens when acid from the stomach flows up into the tube that connects the mouth and the stomach (esophagus). Normally, food travels down the esophagus and stays in the stomach to be digested. With GER, food and stomach acid sometimes move back up into the esophagus. You may have a disease called gastroesophageal reflux disease (GERD) if the reflux: Happens often. Causes frequent or very bad symptoms. Causes problems such as damage to the esophagus. When this happens, the esophagus becomes sore and swollen. Over time, GERD can make small holes (ulcers) in the lining of the esophagus. What are the causes? This condition is caused by a problem with the muscle between the esophagus and the stomach. When this muscle is weak or not normal, it does not close properly to keep food and acid from coming back up from the stomach. The muscle can be weak because of: Tobacco use. Pregnancy. Having a certain type of hernia (hiatal hernia). Alcohol use. Certain foods and drinks, such as coffee, chocolate, onions, and peppermint. What increases the risk? Being overweight. Having a disease that affects your connective tissue. Taking NSAIDs, such a ibuprofen. What are the signs or symptoms? Heartburn. Difficult or  painful swallowing. The feeling of having a lump in the throat. A bitter taste in the mouth. Bad breath. Having a lot of saliva. Having an upset or bloated stomach. Burping. Chest pain. Different conditions can cause chest pain. Make sure you see your doctor if you have chest pain. Shortness of breath or wheezing. A long-term cough or a cough at night. Wearing away of the surface of teeth (tooth  enamel). Weight loss. How is this treated? Making changes to your diet. Taking medicine. Having surgery. Treatment will depend on how bad your symptoms are. Follow these instructions at home: Eating and drinking  Follow a diet as told by your doctor. You may need to avoid foods and drinks such as: Coffee and tea, with or without caffeine. Drinks that contain alcohol. Energy drinks and sports drinks. Bubbly (carbonated) drinks or sodas. Chocolate and cocoa. Peppermint and mint flavorings. Garlic and onions. Horseradish. Spicy and acidic foods. These include peppers, chili powder, curry powder, vinegar, hot sauces, and BBQ sauce. Citrus fruit juices and citrus fruits, such as oranges, lemons, and limes. Tomato-based foods. These include red sauce, chili, salsa, and pizza with red sauce. Fried and fatty foods. These include donuts, french fries, potato chips, and high-fat dressings. High-fat meats. These include hot dogs, rib eye steak, sausage, ham, and bacon. High-fat dairy items, such as whole milk, butter, and cream cheese. Eat small meals often. Avoid eating large meals. Avoid drinking large amounts of liquid with your meals. Avoid eating meals during the 2-3 hours before bedtime. Avoid lying down right after you eat. Do not exercise right after you eat. Lifestyle  Do not smoke or use any products that contain nicotine or tobacco. If you need help quitting, ask your doctor. Try to lower your stress. If you need help doing this, ask your doctor. If you are overweight, lose an amount of weight that is healthy for you. Ask your doctor about a safe weight loss goal. General instructions Pay attention to any changes in your symptoms. Take over-the-counter and prescription medicines only as told by your doctor. Do not take aspirin, ibuprofen, or other NSAIDs unless your doctor says it is okay. Wear loose clothes. Do not wear anything tight around your waist. Raise (elevate) the  head of your bed about 6 inches (15 cm). You may need to use a wedge to do this. Avoid bending over if this makes your symptoms worse. Keep all follow-up visits. Contact a doctor if: You have new symptoms. You lose weight and you do not know why. You have trouble swallowing or it hurts to swallow. You have wheezing or a cough that keeps happening. You have a hoarse voice. Your symptoms do not get better with treatment. Get help right away if: You have sudden pain in your arms, neck, jaw, teeth, or back. You suddenly feel sweaty, dizzy, or light-headed. You have chest pain or shortness of breath. You vomit and the vomit is green, yellow, or black, or it looks like blood or coffee grounds. You faint. Your poop (stool) is red, bloody, or black. You cannot swallow, drink, or eat. These symptoms may represent a serious problem that is an emergency. Do not wait to see if the symptoms will go away. Get medical help right away. Call your local emergency services (911 in the [place] Do not drive yourself to the hospital. Summary If a person has gastroesophageal reflux disease (GERD), food and stomach acid move back up into the esophagus and cause symptoms or problems such  as damage to the esophagus. Treatment will depend on how bad your symptoms are. Follow a diet as told by your doctor. Take all medicines only as told by your doctor. This information is not intended to replace advice given to you by your health care provider. Make sure you discuss any questions you have with your health care provider. Document Revised: 03/24/2020 Document Reviewed: 03/24/2020 [employer] Patient Education  [place].

## 2022-06-15 NOTE — Progress Notes (Signed)
[facility]   Provider:  Dr. Asencion Partridge Jewelene Mairena Referring Provider: Ginger Organ., MD Primary Care Physician:  Ginger Organ., MD  Chief Complaint  Patient presents with   Follow-up    Pt in rm #11 and alone. Pt states he would like to talk about Gabapentin. Pt states he has weakness in both legs and knees.     HPI:  Robert [name] is a 76 y.o. male here as a revisit for Insomnia and OSA , first time since his long [place] trip with wife Robert [name]. He has actually a whole new set of questions.    Robert [name] reported he  was recently seen for a scratchy throat by ENT Dr. Benjamine [name] and he suspected  that Robert [name] has GERD. He had  also recently a new GI MD, dx with Barretts esophagus, a 5 year colonoscopy and  endoscopy was scheduled. Also a CT of the lower abdomen and pelvis:  1. Small fat containing left inguinal hernia and tiny fat containing right inguinal hernia. 2. Prominent mesenteric lymph nodes, unchanged compared to prior CT. 3.  Aortic Atherosclerosis (ICD10-I70.0).     Has Neuropathy in his feet:  Dr. Benjamine [name] placed the patient on Omeprazole and gabapentin, and GI changed to pantoprazole.  Gabapentin was  used to "calm  throat nerves' and he didn't feel that was achieved but the general neuropathy he has is better- takes 600 mg a day now.  Cutting down on acidic foods,,caffeine and alcohol.    The OSA on CPAP is well controlled.        03-21-2013: referred originally by Dr. Jasmine [name] , Urologist.  Dr. Brigitte [name] is this patient's PCP. Dr Robert [name] is his oncologist.  Mr. Crull begun having fragmented sleep and finally  would wake up 5-7 times at night to relieve himself. He tried Flomax without success and then by the recently changed urologists and 2013. Dr. Jasmine [name] finally suspected that sleep apnea may be a contributing cause. He also had seen a dentist about the same time noticed him falling asleep in the dental chair  and urgent to get an workup for obstructive sleep apnea. Dr. Brigitte [name], his primary care physician ordered a sleep study and the patient tested positive for apnea he was titrated to CPAP in a split night procedure the study was performed. The split night procedure took place on 08-16-12 the patient's BMI at that time was 25 his Epworth sleepiness score 4/24 points his neck circumference 15-1/2 inches. He and was to exhalatory at one point. Apnea hypotony index was 18.5 and RDI 18.9 there was no strong REM complement the supine complement. Lowest desaturation was 78% supine REM sleep was 32.8 minutes of desaturation. The patient was titrated to 6 cm water pressure as an AHI of less than 5. He then underwent a  Auto- titration and compliance report through the [name] 2013 into January 2040,  he used his machine 6 hours 30 minutes at night was 100% compliant , his  AHI  was 2.9 in late January 2014 .Patient uses CPAP at 4 to 8 cm water autoset with a residual AHI of 1.7 and average user time 5 hours 27 minutes. Low air leak, 90 days of 93% compliance.He still has nocturia,  TURP procedure 1995, Epworth 5 and FSS 30 points, Patient has found a 'cocktail " that helps him to sleep , using an antihistamine along with prescription sleep aid.   04-24-15 Patient CPAP  download cannot be obtained, memory chip is corrupted.  He will bring her CPAP machine [facility]. He has been having nightmares on unisom. He is treated with cialis for BPH(!). He is 80% better in terms of Nocturia.  He was on a Torboy and woke up 3 times at night, but this was better than at home, and he thinks it was due to his higher level of physicial activity. Lorazepam is used PRN.  I would feel unconcerned about him using a benzodiazepine for the rest of his life.  Interval history from 04/20/2016. With the couple of just returned from his daughter's wedding, with a very exciting event he is 97% compliance with  CPAP use uses an AutoSet between 4 and 8 cm water with 27 m EPR his residual AHI is close to ideal at 1.4 per hour. No adjustments need to be made. He has been losing weight , plans to retire this Friday.  He tore his right posterior tibial tendon and plans to undergo surgery.   Patient seen here on 09-12-2019, meanwhile 76 year old-  He sleeps best on sialis, now generic and CPAP, having reduced his nocturia to 2 times from 5-6 times. He can some nights sleep 5 hours straight.  He enjoyed wine, taking [place], and [employer] cruises, enjoys these tremendously.  He underwent bilateral knee replacement time deferred, and is now walking 5 miles each day pain-free.  He has been able to control weight, hypertension.  His new CPAP machine is an air sense 10 AutoSet between 4 and 8 cmH2O with 1 cm water pressure expiratory relief he has used the machine 29 out of 30 days with over 4 hours with an average user time of 7 5 hours 7 minutes.  Residual AHI is 1.0 apneas per hour which is physiologically normal the 95th percentile pressure is 7.4 he has mild air leakage, he is not excessively daytime sleepy, he would like to donate his S9 AutoSet machine which she on before with similar settings we will for now and followed by modem the serial number machine [id number].   09-11-2020: Mr. Suit reports good sleep overall, his new [employer] ADAPT- will replace his nasal pillow every 14 days. He will inquire in his pharmacy about a petroleum free ointment for his nose.  Less Airleak, less urinary frequency with Sialis and 0.5 mg lorazepam helps to sleep , replaced the benadryl-  And it's anticholinergic side effects in an aging patient. [place].  - exercises.  He gets about 6 hours of sleep - much better than before we started his journey. 100 % compliance. Residual AHI 1.4/h and a 95% air leak of 19.5 L/minutes. Auto setting from 4-8 cm with  1 cm EPR-  He plays banjo- will take a break after elbow surgery on his right elbow, currently in a brace.   07-13-2021: Mr Bissonette, 76 year-old patient and compliant CPAP user, he no longer has nocturia on CPAP and on Cialis,  CPAP- and remains highly compliant he has used the machine 29 out of 30 days 97% and over 4 hours consecutive use was at 90% for 27 days.  Average use at time is 5 hours 35 minutes.  The patient machine is set between a minimum pressure of 4 and a maximum pressure of 8 cmH2O with 1 cm expiratory pressure relief.  The residual AHI  was 1.5/h the 95th percentile pressure is 7.8 cmH2O air leaks are seen at the 95th percentile pressure at 24.5 L/min this is a moderate air leak.  The there is a 6 residual RERA index of 2.0/h no Cheyne-Stokes respirations were measured.  He would like a face to face refitting for a mask that offers better fit.      Review of Systems: Out of a complete 14 system review, the patient complains of only the following symptoms, and all other reviewed systems are negative. Nocturia  is down  from 6-7 times at night to 2-3 times.   How likely are you to doze in the following situations: 0 = not likely, 1 = slight chance, 2 = moderate chance, 3 = high chance  Sitting and Reading? Watching Television? Sitting inactive in a public place (theater or meeting)? Lying down in the afternoon when circumstances permit? Sitting and talking to someone? Sitting quietly after lunch without alcohol? In a car, while stopped for a few minutes in traffic? As a passenger in a car for an hour without a break?  Total = 3/ 24    Epworth Sleepiness Scale today is score today is 3/ 24  points,  and  FSS 22 points, and the GDS depression score remains zero  Points.  No longer nightmares , reduced Nocturia- but not resolved .  GDS 0/ 15   He takes Malatonin.  Social History   Socioeconomic History   Marital status: Married    Spouse name: Robert [name]   Number of  children: 3   Years of education: Ph.D   Highest education level: Not on file  Occupational History   Occupation: ADMIN    Employer: [employer]    Comment: [employer]  Tobacco Use   Smoking status: Never   Smokeless tobacco: Never  Vaping Use   Vaping Use: Never used  Substance and Sexual Activity   Alcohol use: Yes    Alcohol/week: 17.0 standard drinks of alcohol    Types: 8 Glasses of wine, 8 Shots of liquor, 1 Standard drinks or equivalent per week    Comment: each night 1 glass   Drug use: No   Sexual activity: Yes  Other Topics Concern   Not on file  Social History Narrative   Sleep apnea 28 AHI was reduced to 3.4- set to  7 cm water pressure on nasal mask eson or wisp.  He sleep  nonsupine, less nocturia.  Tennis ball method explained.  Patient does not want machine set to a certain pressure.     Downloaded CMS compliance s etsablsihed .  Insomnia not improved further - will try seroquel as  medication approach and discussed behaviour therapy approach .   send for n beahiour therapy and started trial of SEROQUEL>  25 minute visit including CMS compliance and downloading.    Patient is married Robert [name]) and lives at home with his wife.   Patient has three children and his wife has three children.   Patient is working full-time.   Patient has a Ph.D.   Patient is right-handed.   Patient drinks about 21 oz of coffee daily.         Social Determinants of Health   Financial Resource Strain: Not on file  Food Insecurity: Not on file  Transportation Needs: Not on file  Physical Activity: Not on file  Stress: Not on file  Social Connections: Not on file  Intimate Partner Violence: Not on file    Family  History  Problem Relation Age of Onset   Emphysema Mother    Heart attack Father    Hypertension Father    Hypertension Other    High Cholesterol Other     Past Medical History:  Diagnosis Date   Anxiety    Arthritis    Knee , Neck   Colon cancer ([place])     a. s/p colon resection in 4098 and 1191   Complication of anesthesia    difficulty urinating after   Edema of right foot    ? 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 following abnormal stress tests. Both reportedly normal;  b. 02/2013 nl myoview.   Sleep apnea with use of continuous positive airway pressure (CPAP)    wears CPAP nightly    Past Surgical History:  Procedure Laterality Date   CALCANEAL OSTEOTOMY Right 04/29/2016   Procedure: RIGHT CALCANEAL OSTEOTOMY, RIGHT MEDIAL CUNEIFORM OSTEOTOMY;  Surgeon: Wylene Simmer, MD;  Location: [facility];  Service: Orthopedics;  Laterality: Right;   CARDIAC CATHETERIZATION  07/04/1997   COLON RESECTION  2004   x 2 per patient    COLON SURGERY     colon resection   COLONOSCOPY     ELBOW SURGERY Left 2011   INFECTED OLECRANON BURSITIS SURGERY   FOOT SURGERY Right    GASTROCNEMIUS RECESSION Right 04/29/2016   Procedure: RIGHT GASTROC RECESSION;  Surgeon: Wylene Simmer, MD;  Location: [facility];  Service: Orthopedics;  Laterality: Right;   INGUINAL HERNIA REPAIR Right 06/03/2015   Procedure: REPAIR RIGHT INGUINAL HERNIA;  Surgeon: Georganna Skeans, MD;  Location: [facility];  Service: General;  Laterality: Right;   INSERTION OF MESH Right 06/03/2015   Procedure: INSERTION OF MESH;  Surgeon: Georganna Skeans, MD;  Location: [facility];  Service: General;  Laterality: Right;   JOINT REPLACEMENT Left    L4 DISCECTOMY     PARATHYROIDECTOMY Left 03/16/2019   Procedure: LEFT PARATHYROIDECTOMY;  Surgeon: Armandina Gemma, MD;  Location: [facility];  Service: General;  Laterality: Left;   RETINAL DETACHMENT SURGERY  1998   SEPTIC JOINT Left    Elbow   TOTAL KNEE  ARTHROPLASTY Left 01/06/2015   Procedure: LEFT TOTAL KNEE ARTHROPLASTY;  Surgeon: Gaynelle Arabian, MD;  Location: [facility];  Service: Orthopedics;  Laterality: Left;   TOTAL KNEE ARTHROPLASTY Right 10/03/2015   Procedure: RIGHT TOTAL KNEE ARTHROPLASTY;  Surgeon: Gaynelle Arabian, MD;  Location: [facility];  Service: Orthopedics;  Laterality: Right;   TRANSURETHRAL RESECTION OF PROSTATE      Current Outpatient Medications  Medication Sig Dispense Refill   acetaminophen (TYLENOL) 500 MG tablet Take 500 mg by mouth at bedtime.     atorvastatin (LIPITOR) 40 MG tablet TAKE 1 TABLET BY MOUTH EVERY DAY 90 tablet 2   cholecalciferol (VITAMIN D3) 25 MCG (1000 UT) tablet Take 1,000 Units by mouth daily.     CIALIS 5 MG tablet Take 5 mg by mouth daily at 3 pm.      diltiazem (CARDIZEM CD) 180 MG 24 hr capsule TAKE 1 CAPSULE (180 MG TOTAL) BY MOUTH AT BEDTIME. 90 capsule 3   ezetimibe (ZETIA) 10 MG tablet Take 1  tablet (10 mg total) by mouth daily. 90 tablet 3   fexofenadine (ALLEGRA) 180 MG tablet Take 180 mg by mouth daily.     fluticasone (FLONASE) 50 MCG/ACT nasal spray Place 1 spray into both nostrils daily.      gabapentin (NEURONTIN) 300 MG capsule Take 300 mg by mouth at bedtime.     lidocaine (LMX) 4 % cream Apply 1 application topically at bedtime as needed (foot pain).     Loratadine 10 MG CAPS take one po qd- alernating c fexofendadine.     LORazepam (ATIVAN) 1 MG tablet TAKE 0.5 TABLETS (0.5 MG TOTAL) BY MOUTH 2 (TWO) TIMES DAILY AS NEEDED FOR ANXIETY. 30 tablet 1   losartan (COZAAR) 50 MG tablet Take 1&1/2 tablets ( 75 mg )   daily 90 tablet 3   metoprolol succinate (TOPROL-XL) 25 MG 24 hr tablet TAKE 2 TABLETS BY MOUTH EVERY DAY 180 tablet 1   Multiple Vitamin (MULTIVITAMIN WITH MINERALS) TABS tablet Take 1 tablet by mouth daily.     NON FORMULARY CPAP MACHINE     Omega-3 Fatty Acids (FISH OIL) 1000 MG CPDR Take 1,000 mg by mouth daily.      pantoprazole (PROTONIX) 40 MG tablet Take 40 mg by mouth  every morning.     Probiotic Product (PROBIOTIC DAILY PO) Take 1 capsule by mouth daily.     No current facility-administered medications for this visit.    Allergies as of 06/15/2022 - Review Complete 06/15/2022  Allergen Reaction Noted   Betadine [povidone iodine] Other (See Comments) 12/31/2014   Olmesartan  07/30/2021   Oxycodone Other (See Comments) 10/03/2015    Vitals: BP (!) 114/51 (BP Location: Left Arm, Patient Position: Sitting, Cuff Size: Normal)   [name] (!) 55   Ht '5\' 9"'$  (1.753 m)   Wt 181 lb 6.4 oz (82.3 kg)   BMI 26.79 kg/m  Last Weight:  Wt Readings from Last 1 Encounters:  06/15/22 181 lb 6.4 oz (82.3 kg)   Last Height:   Ht Readings from Last 1 Encounters:  06/15/22 '5\' 9"'$  (1.753 m)   Physical exam:  General: The patient is awake, alert and appears not in acute distress. The patient is well groomed. Head: Normocephalic, atraumatic. Neck is supple. Mallampati 2 , neck circumference:16. No retrognathia.  Cardiovascular:  Regular rate and rhythm , without  murmurs or carotid bruit, and without distended neck veins. Respiratory: Lungs are clear to auscultation. Skin:  Without evidence of edema, or rash Trunk: BMI is  normal posture.  Neurologic exam : The patient is awake and alert, oriented to place and time.  Speech is fluent without  dysarthria, dysphonia or aphasia. Mood and affect are appropriate. Cranial nerves:  No loss of smell or taste reported.  Pupils are equal and briskly reactive to light.  Extraocular movements without nystagmus. Visual fields by finger perimetry are intact. Hearing to finger rub intact.   Facial sensation intact to fine touch.  Facial motor strength is symmetric and tongue and uvula move midline. Motor exam:   normal tone and symmetric muscle bulk  food adduction and abduction strength , hip flexion, feet full  dorsal flexion. . Sensory:   Fine touch and vibration were tested in all extremities. Proprioception is  normal.   Right foot numbness to tuning fork.  DTR patella not elicited and downgoing toes.   Assessment:  After physical and neurologic examination, review of laboratory studies, imaging, neurophysiology testing and pre-existing records, assessment :  OSA is  best controlled, there is no further improvement likely.  He shall continue to use CPAP ,auto titration 4-8 cm water, 1 cm EPR. He has had maximum benefit of apnea treatment on nocturia, insomnia. Completely off Benadryl, now Melatonin. Unisom.   Neuropathy concern- Neurontin has helped. Not prescribed by [facility].   Cutting down on alcohol and acidic food.    Larey Seat, MD

## 2022-06-21 DIAGNOSIS — R1031 Right lower quadrant pain: Secondary | ICD-10-CM | POA: Insufficient documentation

## 2022-06-21 DIAGNOSIS — M419 Scoliosis, unspecified: Secondary | ICD-10-CM | POA: Insufficient documentation

## 2022-06-21 DIAGNOSIS — G6189 Other inflammatory polyneuropathies: Secondary | ICD-10-CM | POA: Insufficient documentation

## 2022-06-29 ENCOUNTER — Ambulatory Visit: Payer: Medicare Other | Admitting: Neurology

## 2022-06-29 DIAGNOSIS — M25562 Pain in left knee: Secondary | ICD-10-CM | POA: Insufficient documentation

## 2022-07-13 ENCOUNTER — Ambulatory Visit: Payer: Medicare Other | Admitting: Neurology

## 2022-07-26 DIAGNOSIS — R200 Anesthesia of skin: Secondary | ICD-10-CM | POA: Insufficient documentation

## 2022-08-15 ENCOUNTER — Encounter: Payer: Self-pay | Admitting: Cardiology

## 2022-08-16 MED ORDER — METOPROLOL SUCCINATE ER 25 MG PO TB24: 25.0000 mg | ORAL_TABLET | Freq: Every day | ORAL | 3 refills | Status: DC

## 2022-09-03 DIAGNOSIS — M1611 Unilateral primary osteoarthritis, right hip: Secondary | ICD-10-CM | POA: Insufficient documentation

## 2022-09-06 ENCOUNTER — Ambulatory Visit: Payer: Medicare Other | Admitting: Cardiology

## 2022-09-13 ENCOUNTER — Ambulatory Visit: Payer: Medicare Other | Admitting: Cardiology

## 2022-11-23 ENCOUNTER — Other Ambulatory Visit: Payer: Self-pay | Admitting: Gastroenterology

## 2022-11-23 DIAGNOSIS — R933 Abnormal findings on diagnostic imaging of other parts of digestive tract: Secondary | ICD-10-CM

## 2022-12-01 ENCOUNTER — Other Ambulatory Visit: Payer: Self-pay | Admitting: Neurology

## 2022-12-01 ENCOUNTER — Other Ambulatory Visit: Payer: Medicare Other

## 2022-12-01 ENCOUNTER — Other Ambulatory Visit: Payer: Self-pay | Admitting: Cardiology

## 2022-12-01 DIAGNOSIS — R002 Palpitations: Secondary | ICD-10-CM

## 2022-12-02 ENCOUNTER — Ambulatory Visit
Admission: RE | Admit: 2022-12-02 | Discharge: 2022-12-02 | Disposition: A | Discharge status: Home / Self Care | Payer: Medicare Other | Source: Ambulatory Visit | Attending: Gastroenterology | Admitting: Gastroenterology

## 2022-12-02 ENCOUNTER — Other Ambulatory Visit: Payer: Medicare Other

## 2022-12-02 DIAGNOSIS — R933 Abnormal findings on diagnostic imaging of other parts of digestive tract: Secondary | ICD-10-CM

## 2022-12-02 MED ORDER — IOPAMIDOL (ISOVUE-300) INJECTION 61%
100.0000 mL | Freq: Once | INTRAVENOUS | Status: AC | PRN
  Administered 2022-12-02: 100 mL via INTRAVENOUS

## 2022-12-07 NOTE — Progress Notes (Unsigned)
Cardiology Office Note:    Date:  04/02/2022   ID:  Robert [name], DOB 01/10/1946, MRN [id number]  PCP:  Shaw, William D Jr., MD  [facility] Cardiologist:  Bayan Hedstrom, MD  [facility] Electrophysiologist:  None   Referring MD: Shaw, William D Jr., MD   No chief complaint on file.   History of Present Illness:    Robert [name] is a 76 y.o. male with a hx of coronary calcification, OSA on CPAP, HTN and HLD.  Needs clearance for thumb surgery. There was incidental finding of coronary calcification in mid RCA distribution.  Previous Myoview in June 2014 was negative for ischemia.  EF was normal on the Myoview.  He had history of facial flushing with crestor and pravastatin however able to tolerate Lipitor.  There was concern of bradycardia and fatigue in January 2018.  Toprol dose was reduced with improvement.  Heart monitor showed average heart rate of 64 with lowest heart rate 50.  EGD in June 2018 was normal.  He later developed heart palpitation and went back to prior Toprol-XL dose. He underwent parathyroidectomy for hyperparathyroidism.  Subsequent coronary CT showed minimal nonobstructive disease.  Due to hypotension, hydrochlorothiazide was discontinued.  In December he went on a 3.5 week trip to [place]. Noted BP went up to 150. Was concerned. Noted cough on lisinopril. Was switched to losartan. Since then BP has been well controlled. He is tolerating his medication well. He does note when his BP is up to 135 he feels flushed and doesn't feel as well.    Past Medical History:  Diagnosis Date   Anxiety    Arthritis    Knee , Neck   Colon cancer (HCC)    a. s/p colon resection in 2004 and 2002   Complication of anesthesia    difficulty urinating after   Edema of right foot    ? muscle problem to be addressed after left knee TKA on 01/06/2015 per patient    History of kidney stones    Hyperlipidemia    Hypertension    Neuropathy feet bottom   SECONDARY TO  CHEMOTHERAPY   Nocturia associated with benign prostatic hypertrophy 03/21/2013   Palpitations    S/P cardiac catheterization 1995 and 1998   a. following abnormal stress tests. Both reportedly normal;  b. 02/2013 nl myoview.   Sleep apnea with use of continuous positive airway pressure (CPAP)    wears CPAP nightly    Past Surgical History:  Procedure Laterality Date   CALCANEAL OSTEOTOMY Right 04/29/2016   Procedure: RIGHT CALCANEAL OSTEOTOMY, RIGHT MEDIAL CUNEIFORM OSTEOTOMY;  Surgeon: John Hewitt, MD;  Location: [facility];  Service: Orthopedics;  Laterality: Right;   CARDIAC CATHETERIZATION  07/04/1997   COLON RESECTION  2004   x 2 per patient    COLON SURGERY     colon resection   COLONOSCOPY     ELBOW SURGERY Left 2011   INFECTED OLECRANON BURSITIS SURGERY   FOOT SURGERY Right    GASTROCNEMIUS RECESSION Right 04/29/2016   Procedure: RIGHT GASTROC RECESSION;  Surgeon: John Hewitt, MD;  Location: [facility];  Service: Orthopedics;  Laterality: Right;   INGUINAL HERNIA REPAIR Right 06/03/2015   Procedure: REPAIR RIGHT INGUINAL HERNIA;  Surgeon: Burke Thompson, MD;  [facility];  Service: General;  Laterality: Right;   INSERTION OF MESH Right 06/03/2015   Procedure: INSERTION OF MESH;  Surgeon: Burke Thompson, MD;  [facility];  Service: General;    Laterality: Right;   JOINT REPLACEMENT Left    L4 DISCECTOMY     PARATHYROIDECTOMY Left 03/16/2019   Procedure: LEFT PARATHYROIDECTOMY;  Surgeon: Gerkin, Todd, MD;  Location: [facility];  Service: General;  Laterality: Left;   RETINAL DETACHMENT SURGERY  1998   SEPTIC JOINT Left    Elbow   TOTAL KNEE ARTHROPLASTY Left 01/06/2015   Procedure: LEFT TOTAL KNEE ARTHROPLASTY;  Surgeon: Frank Aluisio, MD;  Location: [facility];  Service: Orthopedics;  Laterality: Left;   TOTAL KNEE ARTHROPLASTY Right 10/03/2015   Procedure: RIGHT TOTAL KNEE ARTHROPLASTY;  Surgeon: Frank Aluisio, MD;  Location: [facility];  Service: Orthopedics;   Laterality: Right;   TRANSURETHRAL RESECTION OF PROSTATE      Current Medications: No outpatient medications have been marked as taking for the 04/05/22 encounter (Appointment) with Jazir Newey M, MD.     Allergies:   Betadine [povidone iodine], Olmesartan, and Oxycodone   Social History   Socioeconomic History   Marital status: Married    Spouse name: Shelley   Number of children: 3   Years of education: Ph.D   Highest education level: Not on file  Occupational History   Occupation: ADMIN    Employer: [employer]    Comment: [employer]  Tobacco Use   Smoking status: Never   Smokeless tobacco: Never  Vaping Use   Vaping Use: Never used  Substance and Sexual Activity   Alcohol use: Yes    Alcohol/week: 17.0 standard drinks of alcohol    Types: 8 Glasses of wine, 8 Shots of liquor, 1 Standard drinks or equivalent per week    Comment: each night 1 glass   Drug use: No   Sexual activity: Yes  Other Topics Concern   Not on file  Social History Narrative   Sleep apnea 28 AHI was reduced to 3.4- set to  7 cm water pressure on nasal mask eson or wisp.  He sleep  nonsupine, less nocturia.  Tennis ball method explained.  Patient does not want machine set to a certain pressure.     Downloaded CMS compliance s etsablsihed .  Insomnia not improved further - will try seroquel as  medication approach and discussed behaviour therapy approach .   send for n beahiour therapy and started trial of SEROQUEL>  25 minute visit including CMS compliance and downloading.    Patient is married (Shelley) and lives at home with his wife.   Patient has three children and his wife has three children.   Patient is working full-time.   Patient has a Ph.D.   Patient is right-handed.   Patient drinks about 21 oz of coffee daily.         Social Determinants of Health   Financial Resource Strain: Not on file  Food Insecurity: Not on file  Transportation Needs: Not on file  Physical Activity:  Not on file  Stress: Not on file  Social Connections: Not on file     Family History: The patient's family history includes Emphysema in his mother; Heart attack in his father; High Cholesterol in an other family member; Hypertension in his father and another family member.  ROS:   Please see the history of present illness.     All other systems reviewed and are negative.  EKGs/Labs/Other Studies Reviewed:    The following studies were reviewed today:  Coronary CT 11/01/2019 IMPRESSION: 1. Coronary calcium score of 25.7. This was 23rd percentile for age and sex matched control.   2.   Mild ascending and descending thoracic aortic wall calcifications   3. Normal coronary origin with right dominance.   4.  Minimal non obstructive calcified plaque in the mid RCA and LAD   5. CAD-RADS 1. Minimal non-obstructive CAD (0-24%). Consider non-atherosclerotic causes of chest pain. Consider preventive therapy and risk factor modification.    EKG:  EKG is not ordered today.    Recent Labs: 02/18/2022: Creatinine, Ser 0.90  Recent Lipid Panel    Component Value Date/Time   CHOL 146 01/20/2021 0916   TRIG 109 01/20/2021 0916   HDL 53 01/20/2021 0916   CHOLHDL 2.8 01/20/2021 0916   CHOLHDL 3.5 01/27/2017 0809   VLDL 34 (H) 01/27/2017 0809   LDLCALC 73 01/20/2021 0916   Dated 07/17/21: cholesterol 147, triglycerides 130, HDL 50, LDL 71. CMET and TSH normal.  Physical Exam:    VS:  There were no vitals taken for this visit.    Wt Readings from Last 3 Encounters:  10/12/21 179 lb 3.2 oz (81.3 kg)  07/13/21 178 lb (80.7 kg)  03/03/21 170 lb (77.1 kg)     GEN:  Well nourished, well developed in no acute distress HEENT: Normal NECK: No JVD; No carotid bruits LYMPHATICS: No lymphadenopathy CARDIAC: RRR, no murmurs, rubs, gallops RESPIRATORY:  Clear to auscultation without rales, wheezing or rhonchi  ABDOMEN: Soft, non-tender, non-distended MUSCULOSKELETAL:  No edema; No  deformity  SKIN: Warm and dry NEUROLOGIC:  Alert and oriented x 3 PSYCHIATRIC:  Normal affect   ASSESSMENT:    No diagnosis found.   PLAN:    In order of problems listed above:  Coronary artery calcification: Previous coronary CT showed minimal disease  Hypertension: Blood pressure is well controlled. We recommend reducing losartan to 25 mg daily now and monitor.   Hyperlipidemia: labs at goal in October.   Medication Adjustments/Labs and Tests Ordered: Current medicines are reviewed at length with the patient today.  Concerns regarding medicines are outlined above.  No orders of the defined types were placed in this encounter.  No orders of the defined types were placed in this encounter.   There are no Patient Instructions on file for this visit.   Signed, Obrien Huskins, MD  04/02/2022 2:30 PM    [facility] 

## 2022-12-08 ENCOUNTER — Encounter: Payer: Self-pay | Admitting: Cardiology

## 2022-12-08 ENCOUNTER — Ambulatory Visit: Payer: Medicare Other | Attending: Cardiology | Admitting: Cardiology

## 2022-12-08 ENCOUNTER — Encounter: Payer: Self-pay | Admitting: Neurology

## 2022-12-08 ENCOUNTER — Ambulatory Visit: Payer: Medicare Other | Admitting: Neurology

## 2022-12-08 VITALS — BP 98/48 | HR 57 | Ht 69.0 in | Wt 175.4 lb

## 2022-12-08 VITALS — BP 104/56 | HR 61 | Ht 69.0 in | Wt 176.0 lb

## 2022-12-08 DIAGNOSIS — E7800 Pure hypercholesterolemia, unspecified: Secondary | ICD-10-CM

## 2022-12-08 DIAGNOSIS — I2584 Coronary atherosclerosis due to calcified coronary lesion: Secondary | ICD-10-CM

## 2022-12-08 DIAGNOSIS — I2510 Atherosclerotic heart disease of native coronary artery without angina pectoris: Secondary | ICD-10-CM | POA: Diagnosis not present

## 2022-12-08 DIAGNOSIS — G4733 Obstructive sleep apnea (adult) (pediatric): Secondary | ICD-10-CM | POA: Diagnosis not present

## 2022-12-08 DIAGNOSIS — I10 Essential (primary) hypertension: Secondary | ICD-10-CM | POA: Diagnosis not present

## 2022-12-08 NOTE — Progress Notes (Signed)
Patient: Robert [name] Date of Birth: 09-Jun-1946  Reason for Visit: Follow up History from: Patient Primary Neurologist: Robert [name]   ASSESSMENT AND PLAN 77 y.o. year old male   OSA on CPAP -Compliance looks really good, continues to see benefit from CPAP, continue nightly use > 4 hours, continue current settings, send supplies from DME as needed, some mask leak but will be more diligent about replacing his mask regularly  -Follow up in 1 year   HISTORY OF PRESENT ILLNESS: Today 12/08/22 Here today alone for CPAP revisit. Setup date was August 2020. His machine is doing fine. Using nasal mask resmed with memory foam. Review of CPAP download 11/08/2022-12/07/2022 shows 30/30 days greater than 4 hours 29/30 days at 97%.  Average usage 7 hours 16 minutes.  Minimum pressure 4, maximum pressure 8 cmH2O.  EPR level 1.  Leak 25.8.  AHI 3.4. he has some leak of the mask, he thinks he forgets to change the mask because it is so comfortable. He didn't use CPAP last night due to sore on his nose, he was tired today, he took a nap. He has BPH has nocturia. ESS 4.   HISTORY  06/15/22 Dr. Brett [name] HPI:  Robert [name] is a 77 y.o. male here as a revisit for Insomnia and OSA , first time since his long [place] and [place] trip with wife Robert [name]. He has actually a whole new set of questions.     Robert [name] reported he  was recently seen for a scratchy throat by ENT Dr. Benjamine [name] and he suspected  that Robert [name] has GERD. He had  also recently a new GI MD, dx with Barretts esophagus, a 5 year colonoscopy and  endoscopy was scheduled. Also a CT of the lower abdomen and pelvis:   1. Small fat containing left inguinal hernia and tiny fat containing right inguinal hernia. 2. Prominent mesenteric lymph nodes, unchanged compared to prior CT. 3.  Aortic Atherosclerosis (ICD10-I70.0).      Has Neuropathy in his feet:  Dr. Benjamine [name] placed the patient on Omeprazole and gabapentin, and GI changed to pantoprazole.   Gabapentin was  used to "calm  throat nerves' and he didn't feel that was achieved but the general neuropathy he has is better- takes 600 mg a day now.  Cutting down on acidic foods,,caffeine and alcohol.     The OSA on CPAP is well controlled.   REVIEW OF SYSTEMS: Out of a complete 14 system review of symptoms, the patient complains only of the following symptoms, and all other reviewed systems are negative.  See HPI  ALLERGIES: Allergies  Allergen Reactions   Betadine [Povidone Iodine] Other (See Comments)    Causes stinging of skin    Olmesartan     Other reaction(s): racing heart, palpitations, low diastlic   Oxycodone Other (See Comments)    nightmares    HOME MEDICATIONS: Outpatient Medications Prior to Visit  Medication Sig Dispense Refill   acetaminophen (TYLENOL) 500 MG tablet Take 500 mg by mouth at bedtime.     atorvastatin (LIPITOR) 40 MG tablet TAKE 1 TABLET BY MOUTH EVERY DAY 90 tablet 2   cholecalciferol (VITAMIN D3) 25 MCG (1000 UT) tablet Take 1,000 Units by mouth daily.     CIALIS 5 MG tablet Take 5 mg by mouth daily at 3 pm.      diltiazem (CARDIZEM CD) 180 MG 24 hr capsule TAKE 1 CAPSULE (180 MG TOTAL) BY MOUTH AT BEDTIME. 90 capsule 3  ezetimibe (ZETIA) 10 MG tablet Take 1 tablet (10 mg total) by mouth daily. 90 tablet 3   fexofenadine (ALLEGRA) 180 MG tablet Take 180 mg by mouth daily.     fluticasone (FLONASE) 50 MCG/ACT nasal spray Place 1 spray into both nostrils daily.      gabapentin (NEURONTIN) 300 MG capsule Take 300 mg by mouth at bedtime.     lidocaine (LMX) 4 % cream Apply 1 application topically at bedtime as needed (foot pain).     LORazepam (ATIVAN) 1 MG tablet TAKE 0.5 TABLETS (0.5 MG TOTAL) BY MOUTH 2 (TWO) TIMES DAILY AS NEEDED FOR ANXIETY. 30 tablet 5   losartan (COZAAR) 50 MG tablet Take 100 mg by mouth daily. 90 tablet 3   metoprolol succinate (TOPROL-XL) 25 MG 24 hr tablet TAKE 2 TABLETS BY MOUTH EVERY DAY (Patient taking differently:  Take by mouth daily. Pt taking 1.5 tabs) 180 tablet 1   Multiple Vitamin (MULTIVITAMIN WITH MINERALS) TABS tablet Take 1 tablet by mouth daily.     NON FORMULARY CPAP MACHINE     pantoprazole (PROTONIX) 40 MG tablet Take 40 mg by mouth every morning.     Probiotic Product (PROBIOTIC DAILY PO) Take 1 capsule by mouth daily.     Loratadine 10 MG CAPS take one po qd- alernating c fexofendadine. (Patient not taking: Reported on 12/08/2022)     Omega-3 Fatty Acids (FISH OIL) 1000 MG CPDR Take 1,000 mg by mouth daily.  (Patient not taking: Reported on 12/08/2022)     No facility-administered medications prior to visit.    PAST MEDICAL HISTORY: Past Medical History:  Diagnosis Date   Anxiety    Arthritis    Knee , Neck   Colon cancer ([place] [place])    a. s/p colon resection in [id number] and [id number]   Complication of anesthesia    difficulty urinating after   Edema of right foot    ? 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 following abnormal stress tests. Both reportedly normal;  b. 02/2013 nl myoview.   Sleep apnea with use of continuous positive airway pressure (CPAP)    wears CPAP nightly    PAST SURGICAL HISTORY: Past Surgical History:  Procedure Laterality Date   CALCANEAL OSTEOTOMY Right 04/29/2016   Procedure: RIGHT CALCANEAL OSTEOTOMY, RIGHT MEDIAL CUNEIFORM OSTEOTOMY;  Surgeon: Wylene Simmer, MD;  Location: [place];  Service: Orthopedics;  Laterality: Right;   CARDIAC CATHETERIZATION  07/04/1997   COLON RESECTION  2004   x 2 per patient    COLON SURGERY     colon resection   COLONOSCOPY     ELBOW SURGERY Left 2011   INFECTED OLECRANON BURSITIS SURGERY   FOOT SURGERY Right    GASTROCNEMIUS  RECESSION Right 04/29/2016   Procedure: RIGHT GASTROC RECESSION;  Surgeon: Wylene Simmer, MD;  Location: [place];  Service: Orthopedics;  Laterality: Right;   INGUINAL HERNIA REPAIR Right 06/03/2015   Procedure: REPAIR RIGHT INGUINAL HERNIA;  Surgeon: Georganna Skeans, MD;  Location: [place] [place];  Service: General;  Laterality: Right;   INSERTION OF MESH Right 06/03/2015   Procedure: INSERTION OF MESH;  Surgeon: Georganna Skeans, MD;  Location[facility];  Service: General;  Laterality: Right;   JOINT REPLACEMENT Left    L4 DISCECTOMY     PARATHYROIDECTOMY Left 03/16/2019   Procedure: LEFT PARATHYROIDECTOMY;  Surgeon: Armandina Gemma, MD;  [facility];  Service: General;  Laterality: Left;   RETINAL DETACHMENT SURGERY  1998   SEPTIC JOINT Left    Elbow   TOTAL KNEE ARTHROPLASTY Left 01/06/2015   Procedure: LEFT TOTAL KNEE ARTHROPLASTY;  Surgeon: Gaynelle Arabian, MD;  Location[facility];  Service: Orthopedics;  Laterality: Left;   TOTAL KNEE ARTHROPLASTY Right 10/03/2015   Procedure: RIGHT TOTAL KNEE ARTHROPLASTY;  Surgeon: Gaynelle Arabian, MD;  Location[facility];  Service: Orthopedics;  Laterality: Right;   TRANSURETHRAL RESECTION OF PROSTATE      FAMILY HISTORY: Family History  Problem Relation Age of Onset   Emphysema Mother    Heart attack Father    Hypertension Father    Hypertension Other    High Cholesterol Other     SOCIAL HISTORY: Social History   Socioeconomic History   Marital status: Married    Spouse name: Robert [name]   Number of children: 3   Years of education: Ph.D   Highest education level: Not on file  Occupational History   Occupation: ADMIN    Employer: [employer]    Comment: [employer]  Tobacco Use   Smoking status: Never   Smokeless tobacco: Never  Vaping Use   Vaping Use: Never used  Substance and Sexual Activity   Alcohol use: Yes    Alcohol/week: 17.0 standard drinks of alcohol    Types: 8 Glasses of wine, 8 Shots of liquor, 1 Standard drinks or  equivalent per week    Comment: each night 1 glass   Drug use: No   Sexual activity: Yes  Other Topics Concern   Not on file  Social History Narrative   Sleep apnea 28 AHI was reduced to 3.4- set to  7 cm water pressure on nasal mask eson or wisp.  He sleep  nonsupine, less nocturia.  Tennis ball method explained.  Patient does not want machine set to a certain pressure.     Downloaded CMS compliance s etsablsihed .  Insomnia not improved further - will try seroquel as  medication approach and discussed behaviour therapy approach .   send for n beahiour therapy and started trial of SEROQUEL>  25 minute visit including CMS compliance and downloading.    Patient is married Robert [name]) and lives at home with his wife.   Patient has three children and his wife has three children.   Patient is working full-time.   Patient has a Ph.D.   Patient is right-handed.   Patient drinks about 21 oz of coffee daily.         Social Determinants of Health   Financial Resource Strain: Not on file  Food Insecurity: Not on file  Transportation Needs: Not on file  Physical Activity: Not on file  Stress: Not on file  Social Connections: Not on file  Intimate Partner Violence: Not on file    PHYSICAL EXAM  Vitals:   12/08/22 1448  BP: (!) 104/56  Pulse: 61  Weight: 176 lb (79.8 kg)  Height: '5\' 9"'$  (1.753 m)   Body mass index is 25.99 kg/m.  Generalized: Well developed, in no acute distress  Neurological examination  Mentation: Alert oriented to time, place, history taking. Follows all commands speech and language fluent Cranial nerve II-XII: Pupils were equal round reactive to  light. Extraocular movements were full, visual field were full on confrontational test. Facial sensation and strength were normal.  Head turning and shoulder shrug  were normal and symmetric. Motor: The motor testing reveals 5 over 5 strength of all 4 extremities. Good symmetric motor tone is noted throughout.  Sensory:  Sensory testing is intact to soft touch on all 4 extremities. No evidence of extinction is noted.  Coordination: Cerebellar testing reveals good finger-nose-finger  Gait and station: Gait is normal.   DIAGNOSTIC DATA (LABS, IMAGING, TESTING) - I reviewed patient records, labs, notes, testing and imaging myself where available.  Lab Results  Component Value Date   WBC 7.5 03/03/2021   HGB 15.8 03/03/2021   HCT 47.9 03/03/2021   MCV 89.4 03/03/2021   PLT 193 03/03/2021      Component Value Date/Time   NA 138 03/03/2021 2004   NA 140 01/20/2021 0915   K 4.1 03/03/2021 2004   CL 102 03/03/2021 2004   CO2 27 03/03/2021 2004   GLUCOSE 88 03/03/2021 2004   BUN 22 03/03/2021 2004   BUN 24 01/20/2021 0915   CREATININE 0.90 02/18/2022 0852   CREATININE 1.04 08/26/2016 0941   CALCIUM 9.6 03/03/2021 2004   PROT 7.3 03/03/2021 2004   PROT 6.3 01/20/2021 0915   ALBUMIN 4.8 03/03/2021 2004   ALBUMIN 4.4 01/20/2021 0915   AST 26 03/03/2021 2004   ALT 37 03/03/2021 2004   ALKPHOS 72 03/03/2021 2004   BILITOT 0.6 03/03/2021 2004   BILITOT 0.5 01/20/2021 0915   GFRNONAA >60 03/03/2021 2004   GFRAA 81 11/27/2019 0822   Lab Results  Component Value Date   CHOL 146 01/20/2021   HDL 53 01/20/2021   LDLCALC 73 01/20/2021   TRIG 109 01/20/2021   CHOLHDL 2.8 01/20/2021   No results found for: "HGBA1C" No results found for: "VITAMINB12" Lab Results  Component Value Date   TSH 1.13 10/23/2013    Butler Denmark, AGNP-C, DNP 12/08/2022, 2:56 PM [facility] [address] [place], [place] [address] [phone]

## 2022-12-09 NOTE — Progress Notes (Signed)
Cpap orders sent

## 2023-01-05 ENCOUNTER — Ambulatory Visit: Payer: Medicare Other | Admitting: Cardiology

## 2023-01-07 ENCOUNTER — Encounter: Payer: Self-pay | Admitting: Cardiology

## 2023-01-31 ENCOUNTER — Telehealth: Payer: Self-pay

## 2023-01-31 NOTE — Telephone Encounter (Signed)
   Name: Robert [name]  DOB: 1946-08-12  MRN: [id number]   Primary Cardiologist: Peter Swaziland, MD  Chart reviewed as part of pre-operative protocol coverage. MARICUS RICCIARDELLI was last seen on 12/08/2022 by Dr. Swaziland.  He was doing well at that time.  His blood pressure was slightly lower than desired and losartan was decreased.  Per Dr. Swaziland, he is low risk for upcoming hip surgery.  Therefore, based on ACC/AHA guidelines, the patient would be at acceptable risk for the planned procedure without further cardiovascular testing.    I will route this recommendation to the requesting party via Epic fax function and remove from pre-op pool. Please call with questions.  Carlos Levering, NP 01/31/2023, 4:01 PM

## 2023-01-31 NOTE — Telephone Encounter (Signed)
   Pre-operative Risk Assessment    Patient Name: Robert [name]  DOB: 04/25/1946 MRN: [id number]      Request for Surgical Clearance    Procedure:   RT HIP ARTHOPLASTY  Date of Surgery:  Clearance 04/05/23                                 Surgeon:  DR. Ollen Gross Surgeon's Group or Practice Name:  [facility] Phone number:  [phone] Fax number:  [phone]   Type of Clearance Requested:   - Medical    Type of Anesthesia:   CHOICE    Additional requests/questions:    [name]   01/31/2023, 12:44 PM

## 2023-01-31 NOTE — Telephone Encounter (Signed)
Dr. Swaziland,   You saw this patient on 12/08/2022. Will you please comment on medical clearance for right hip arthroplasty?  Please route your response to P [facility] Preop. I will communicate with requesting office once you have given recommendations.   Thank you!  Carlos Levering, NP

## 2023-02-03 ENCOUNTER — Other Ambulatory Visit: Payer: Self-pay | Admitting: Cardiology

## 2023-02-27 ENCOUNTER — Other Ambulatory Visit: Payer: Self-pay | Admitting: Cardiology

## 2023-06-25 ENCOUNTER — Other Ambulatory Visit: Payer: Self-pay | Admitting: Neurology

## 2023-06-29 ENCOUNTER — Telehealth: Payer: Self-pay | Admitting: Neurology

## 2023-06-29 NOTE — Telephone Encounter (Signed)
Pt calling to check status of refill for LORazepam (ATIVAN) 1 MG tablet.

## 2023-07-13 NOTE — Progress Notes (Signed)
Cardiology Office Note:    Date:  07/20/2023   ID:  [name], DOB [birth year], MRN [id number]  PCP:  Cleatis Polka., MD  [facility] Cardiologist:  Blaise Grieshaber [place], MD  [facility] Electrophysiologist:  None   Referring MD: Cleatis Polka., MD   No chief complaint on file.   History of Present Illness:    Robert [name] is a 77 y.o. male with a hx of coronary calcification, OSA on CPAP, HTN and HLD.  Needs clearance for thumb surgery. There was incidental finding of coronary calcification in mid RCA distribution.  Previous Myoview in June 2014 was negative for ischemia.  EF was normal on the Myoview.  He had history of facial flushing with crestor and pravastatin however able to tolerate Lipitor.  There was concern of bradycardia and fatigue in January 2018.  Toprol dose was reduced with improvement.  Heart monitor showed average heart rate of 64 with lowest heart rate 50.  EGD in June 2018 was normal.  He later developed heart palpitation and went back to prior Toprol-XL dose. He underwent parathyroidectomy for hyperparathyroidism.  Subsequent coronary CT showed minimal nonobstructive disease.  Due to hypotension, hydrochlorothiazide was discontinued.  In December he went on a 3.5 week trip to [place]. Noted BP went up to 150. Was concerned. Noted cough on lisinopril. Was switched to losartan.   He went on a cruise down the [place]. Again noted BP spiking to peak 164/66 with pulse 81. Took extra losartan without much effect. Took extra Toprol and BP came down. He is concerned that BP tends to spike on trips while otherwise BP is well controlled. Wants to know what to do when this happens.    Past Medical History:  Diagnosis Date   Anxiety    Arthritis    Knee , Neck   Colon cancer (HCC)    a. s/p colon resection in 2004 and 2002   Complication of anesthesia    difficulty urinating after   Edema of right foot    ? 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 following abnormal stress tests. Both reportedly normal;  b. 02/2013 nl myoview.   Sleep apnea with use of continuous positive airway pressure (CPAP)    wears CPAP nightly    Past Surgical History:  Procedure Laterality Date   CALCANEAL OSTEOTOMY Right 04/29/2016   Procedure: RIGHT CALCANEAL OSTEOTOMY, RIGHT MEDIAL CUNEIFORM OSTEOTOMY;  Surgeon: Toni Arthurs, MD;  Location: [facility];  Service: Orthopedics;  Laterality: Right;   CARDIAC CATHETERIZATION  07/04/1997   COLON RESECTION  2004   x 2 per patient    COLON SURGERY     colon resection   COLONOSCOPY     ELBOW SURGERY Left 2011   INFECTED OLECRANON BURSITIS SURGERY   FOOT SURGERY Right    GASTROCNEMIUS RECESSION Right 04/29/2016   Procedure: RIGHT GASTROC RECESSION;  Surgeon: Toni Arthurs, MD;  Location: [facility];  Service: Orthopedics;  Laterality: Right;   INGUINAL HERNIA REPAIR Right 06/03/2015   Procedure: REPAIR RIGHT INGUINAL HERNIA;  Surgeon: Violeta Gelinas, MD;  Location: [facility];  Service: General;  Laterality: Right;   INSERTION OF MESH Right 06/03/2015   Procedure: INSERTION OF MESH;  Surgeon: Violeta Gelinas, MD;  Location: [facility];  Service: General;  Laterality: Right;   JOINT REPLACEMENT Left    L4 DISCECTOMY     PARATHYROIDECTOMY Left 03/16/2019   Procedure: LEFT PARATHYROIDECTOMY;  Surgeon: Darnell Level, MD;  Location: [facility];  Service: General;  Laterality: Left;   RETINAL DETACHMENT SURGERY  1998   SEPTIC JOINT Left    Elbow   TOTAL KNEE ARTHROPLASTY Left 01/06/2015   Procedure: LEFT TOTAL KNEE ARTHROPLASTY;  Surgeon: Ollen Gross, MD;  Location: [facility];  Service: Orthopedics;  Laterality:  Left;   TOTAL KNEE ARTHROPLASTY Right 10/03/2015   Procedure: RIGHT TOTAL KNEE ARTHROPLASTY;  Surgeon: Ollen Gross, MD;  Location: [facility];  Service: Orthopedics;  Laterality: Right;   TRANSURETHRAL RESECTION OF PROSTATE      Current Medications: Current Meds  Medication Sig   acetaminophen (TYLENOL) 500 MG tablet Take 500 mg by mouth at bedtime.   atorvastatin (LIPITOR) 40 MG tablet TAKE 1 TABLET BY MOUTH EVERY DAY   B Complex Vitamins (VITAMIN B COMPLEX PO) 1 tablet Orally once a day   cholecalciferol (VITAMIN D3) 25 MCG (1000 UT) tablet Take 1,000 Units by mouth daily.   CIALIS 5 MG tablet Take 5 mg by mouth daily at 3 pm.    colestipol (COLESTID) 1 g tablet Take 2 g by mouth at bedtime.   diltiazem (CARDIZEM CD) 180 MG 24 hr capsule TAKE 1 CAPSULE (180 MG TOTAL) BY MOUTH AT BEDTIME.   ezetimibe (ZETIA) 10 MG tablet Take 1 tablet (10 mg total) by mouth daily.   fexofenadine (ALLEGRA) 180 MG tablet Take 180 mg by mouth daily.   fluticasone (FLONASE) 50 MCG/ACT nasal spray Place 1 spray into both nostrils daily.    hydrALAZINE (APRESOLINE) 25 MG tablet Take 1 tablet (25 mg total) by mouth 3 (three) times daily as needed. For elevated BP   iron polysaccharides (NIFEREX) 150 MG capsule Take 150 mg by mouth daily.   lidocaine (LMX) 4 % cream Apply 1 application topically at bedtime as needed (foot pain).   loratadine (CLARITIN) 10 MG tablet Take 10 mg by mouth daily as needed.   LORazepam (ATIVAN) 1 MG tablet TAKE 1/2 TABLET (0.5 MG TOTAL) BY MOUTH 2 (TWO) TIMES DAILY AS NEEDED FOR ANXIETY.   losartan (COZAAR) 50 MG tablet TAKE 2 TABLETS ( 100 MG ) DAILY   melatonin 5 MG TABS Take 5 mg by mouth at bedtime.   metoprolol succinate (TOPROL-XL) 25 MG 24 hr tablet TAKE 2 TABLETS BY MOUTH EVERY DAY (Patient taking differently: Take by mouth daily. Pt taking 1.5 tabs)   Multiple Vitamin (MULTIVITAMIN WITH MINERALS) TABS tablet Take 1 tablet by mouth daily.   NON FORMULARY CPAP MACHINE    pantoprazole (PROTONIX) 40 MG tablet Take 40 mg by mouth every morning.   Probiotic Product (PROBIOTIC DAILY PO) Take 1 capsule by mouth daily.     Allergies:   Betadine [povidone iodine], Olmesartan, and Oxycodone   Social History   Socioeconomic History   Marital status: Married    Spouse name: Vance Gather   Number of children: 3   Years of education: Ph.D   Highest education level: Not on file  Occupational History   Occupation: ADMIN    Employer: [employer]    Comment: [employer]  Tobacco Use   Smoking status: Never   Smokeless tobacco: Never  Vaping Use   Vaping status: Never Used  Substance and Sexual Activity   Alcohol use: Yes    Alcohol/week: 17.0 standard drinks of alcohol    Types: 8 Glasses of wine, 8 Shots of liquor, 1 Standard drinks or equivalent per week    Comment: each night 1 glass   Drug use: No   Sexual activity: Yes  Other Topics Concern   Not on file  Social History Narrative   Sleep apnea 28 AHI was reduced to 3.4- set to  7 cm water pressure on nasal mask eson or wisp.  He sleep  nonsupine, less nocturia.  Tennis ball method explained.  Patient does not want machine set to a certain pressure.     Downloaded CMS compliance s etsablsihed .  Insomnia not improved further - will try seroquel as  medication approach and discussed behaviour therapy approach .   send for n beahiour therapy and started trial of SEROQUEL>  25 minute visit including CMS compliance and downloading.    Patient is married Vance Gather) and lives at home with his wife.   Patient has three children and his wife has three children.   Patient is working full-time.   Patient has a Ph.D.   Patient is right-handed.   Patient drinks about 21 oz of coffee daily.         Social Determinants of Health   Financial Resource Strain: Not on file  Food Insecurity: Not on file  Transportation Needs: Not on file  Physical Activity: Not on file  Stress: Not on file  Social Connections:  Not on file     Family History: The patient's family history includes Emphysema in his mother; Heart attack in his father; High Cholesterol in an other family member; Hypertension in his father and another family member.  ROS:   Please see the history of present illness.     All other systems reviewed and are negative.  EKGs/Labs/Other Studies Reviewed:    The following studies were reviewed today:  Coronary CT 11/01/2019 IMPRESSION: 1. Coronary calcium score of 25.7. This was 23rd percentile for age and sex matched control.   2. Mild ascending and descending thoracic aortic wall calcifications   3. Normal coronary origin with right dominance.   4.  Minimal non obstructive calcified plaque in the mid RCA and LAD   5. CAD-RADS 1. Minimal non-obstructive CAD (0-24%). Consider non-atherosclerotic causes of chest pain. Consider preventive therapy and risk factor modification.   EKG Interpretation Date/Time:  Wednesday July 20 2023 11:40:22 EDT Ventricular Rate:  50 PR Interval:  172 QRS Duration:  82 QT Interval:  458 QTC Calculation: 417 R Axis:   33  Text Interpretation: Sinus bradycardia When compared with ECG of 03-Mar-2021 18:29, No significant change was found Confirmed by [place], Ciera Beckum [id number]) on 07/20/2023 11:54:35 AM     Recent Labs: No results found for requested labs within last 365 days.  Recent Lipid Panel    Component Value Date/Time   CHOL 146 01/20/2021 0916   TRIG 109 01/20/2021 0916   HDL 53 01/20/2021 0916   CHOLHDL 2.8 01/20/2021 0916   CHOLHDL 3.5 01/27/2017 0809   VLDL 34 (H) 01/27/2017 0809   LDLCALC 73 01/20/2021 0916   Dated 07/17/21: cholesterol 147, triglycerides 130, HDL 50, LDL 71. CMET and TSH normal. Dated 08/09/22: cholesterol 137, triglycerides 93, HDL 56, LDL 62. Normal CMET and TSH Dated 10/18/22: normal CBC  Physical Exam:    VS:  BP 120/74  Pulse (!) 50   Ht 5\' 9"  (1.753 m)   Wt 181 lb 3.2 oz (82.2 kg)   SpO2 99%    BMI 26.76 kg/m     Wt Readings from Last 3 Encounters:  07/20/23 181 lb 3.2 oz (82.2 kg)  12/08/22 175 lb 6.4 oz (79.6 kg)  12/08/22 176 lb (79.8 kg)     GEN:  Well nourished, well developed in no acute distress HEENT: Normal NECK: No JVD; No carotid bruits LYMPHATICS: No lymphadenopathy CARDIAC: RRR, no murmurs, rubs, gallops RESPIRATORY:  Clear to auscultation without rales, wheezing or rhonchi  ABDOMEN: Soft, non-tender, non-distended MUSCULOSKELETAL:  No edema; No deformity  SKIN: Warm and dry NEUROLOGIC:  Alert and oriented x 3 PSYCHIATRIC:  Normal affect   ASSESSMENT:    1. Primary hypertension   2. Pure hypercholesterolemia   3. Coronary artery calcification   4. Coronary artery disease involving native coronary artery of native heart without angina pectoris       PLAN:    In order of problems listed above:  Coronary artery calcification: Previous coronary CT showed minimal disease  Hypertension: Blood pressure is well controlled generally but does seem to spike when he travels. We will continue his baseline therapy with Toprol, diltiazem, and losartan. I have given him a Rx for hydralazine 25 mg to use as needed for BP spikes. Follow up in 6 months.  Hyperlipidemia: labs at goal in November   Medication Adjustments/Labs and Tests Ordered: Current medicines are reviewed at length with the patient today.  Concerns regarding medicines are outlined above.  Orders Placed This Encounter  Procedures   EKG 12-Lead   Meds ordered this encounter  Medications   hydrALAZINE (APRESOLINE) 25 MG tablet    Sig: Take 1 tablet (25 mg total) by mouth 3 (three) times daily as needed. For elevated BP    Dispense:  45 tablet    Refill:  3    Patient Instructions  Medication Instructions:    *If you need a refill on your cardiac medications before your next appointment, please call your pharmacy*   Lab Work:   If you have labs (blood work) drawn today and your tests  are completely normal, you will receive your results only by: MyChart Message (if you have MyChart) OR A paper copy in the mail If you have any lab test that is abnormal or we need to change your treatment, we will call you to review the results.   Testing/Procedures:     Follow-Up: At [facility], you and your health needs are our priority.  As part of our continuing mission to provide you with exceptional heart care, we have created designated Provider Care Teams.  These Care Teams include your primary Cardiologist (physician) and Advanced Practice Providers (APPs -  Physician Assistants and Nurse Practitioners) who all work together to provide you with the care you need, when you need it.  We recommend signing up for the patient portal called "MyChart".  Sign up information is provided on this After Visit Summary.  MyChart is used to connect with patients for Virtual Visits (Telemedicine).  Patients are able to view lab/test results, encounter notes, upcoming appointments, etc.  Non-urgent messages can be sent to your provider as well.   To learn more about what you can do with MyChart, go to [email or web address].    Your next appointment:      Provider:     Other Instructions      Signed, Theron Arista  [place], MD  07/20/2023 12:11 PM    [facility]

## 2023-07-16 ENCOUNTER — Telehealth: Payer: Self-pay | Admitting: Internal Medicine

## 2023-07-16 NOTE — Telephone Encounter (Signed)
Patient contacted the cardiology after hours line to discuss intermittently elevated blood pressures.  His highest blood pressure systolic was 150 mmHg with normal diastolic.  I informed patient that he should just pick a time of the day that he only monitors his blood pressure once.  Ideally, we are looking for average blood pressures and not just one sporadic read to determine next steps in blood pressure management.  Patient is to continue with the medications he is on now.

## 2023-07-20 ENCOUNTER — Encounter: Payer: Self-pay | Admitting: Cardiology

## 2023-07-20 ENCOUNTER — Ambulatory Visit: Payer: Medicare Other | Attending: Cardiology | Admitting: Cardiology

## 2023-07-20 VITALS — BP 120/74 | HR 50 | Ht 69.0 in | Wt 181.2 lb

## 2023-07-20 DIAGNOSIS — E7800 Pure hypercholesterolemia, unspecified: Secondary | ICD-10-CM

## 2023-07-20 DIAGNOSIS — I2510 Atherosclerotic heart disease of native coronary artery without angina pectoris: Secondary | ICD-10-CM

## 2023-07-20 DIAGNOSIS — I10 Essential (primary) hypertension: Secondary | ICD-10-CM | POA: Diagnosis not present

## 2023-07-20 MED ORDER — HYDRALAZINE HCL 25 MG PO TABS: 25.0000 mg | ORAL_TABLET | Freq: Three times a day (TID) | ORAL | 3 refills | Status: DC | PRN

## 2023-07-20 NOTE — Patient Instructions (Addendum)
Medication Instructions:  Start Hydralazine 25 mg tablet three times a day as needed  Continue all current medications  *If you need a refill on your cardiac medications before your next appointment, please call your pharmacy*   Lab Work: none  Testing/Procedures: none     Follow-Up: At [facility], you and your health needs are our priority.  As part of our continuing mission to provide you with exceptional heart care, we have created designated Provider Care Teams.  These Care Teams include your primary Cardiologist (physician) and Advanced Practice Providers (APPs -  Physician Assistants and Nurse Practitioners) who all work together to provide you with the care you need, when you need it.   Your next appointment:   6 monrths.  Call office in  Jan 2025 to schedule appt for April 2025    Provider:  Dr. [place]   Other Instructions

## 2023-07-27 ENCOUNTER — Other Ambulatory Visit: Payer: Self-pay | Admitting: Cardiology

## 2023-08-28 ENCOUNTER — Other Ambulatory Visit: Payer: Self-pay | Admitting: Cardiology

## 2023-08-28 DIAGNOSIS — R002 Palpitations: Secondary | ICD-10-CM

## 2023-08-30 ENCOUNTER — Encounter: Payer: Self-pay | Admitting: Cardiology

## 2023-08-31 ENCOUNTER — Other Ambulatory Visit: Payer: Self-pay

## 2023-08-31 DIAGNOSIS — R002 Palpitations: Secondary | ICD-10-CM

## 2023-08-31 MED ORDER — METOPROLOL SUCCINATE ER 25 MG PO TB24: 50.0000 mg | ORAL_TABLET | Freq: Every day | ORAL | 3 refills | Status: DC

## 2023-08-31 NOTE — Telephone Encounter (Signed)
 Rx sent to requested Pharmacy.

## 2023-10-06 ENCOUNTER — Ambulatory Visit (HOSPITAL_COMMUNITY)
Admission: EM | Admit: 2023-10-06 | Discharge: 2023-10-06 | Disposition: A | Discharge status: Home / Self Care | Payer: Medicare Other | Attending: Family Medicine | Admitting: Family Medicine

## 2023-10-06 ENCOUNTER — Encounter (HOSPITAL_COMMUNITY): Payer: Self-pay | Admitting: Emergency Medicine

## 2023-10-06 DIAGNOSIS — J069 Acute upper respiratory infection, unspecified: Secondary | ICD-10-CM

## 2023-10-06 MED ORDER — BENZONATATE 200 MG PO CAPS: 200.0000 mg | ORAL_CAPSULE | Freq: Three times a day (TID) | ORAL | 0 refills | Status: DC | PRN

## 2023-10-06 NOTE — ED Provider Notes (Signed)
 [facility]    CSN: [id number] Arrival date & time: 10/06/23  0915      History   Chief Complaint Chief Complaint  Patient presents with   Cough    HPI Robert [name] is a 78 y.o. male.    Cough Associated symptoms: no chills, no fever, no shortness of breath and no wheezing    Patient is here for URI symptoms.  He has had a scratchy throat x 4 days, and then started coughing. Yesterday he started with a worsening cough.  He does not feel sick, no fever.  He did a home covid test which was negative.  His wife has had the same cold.  No wheezing or sob.  Mild runny nose, but thinks due to allergies.  He does take allegra daily.  No n/v.       Past Medical History:  Diagnosis Date   Anxiety    Arthritis    Knee , Neck   Colon cancer (HCC)    a. s/p colon resection in 2004 and 2002   Complication of anesthesia    difficulty urinating after   Edema of right foot    ? 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 following abnormal stress tests. Both reportedly normal;  b. 02/2013 nl myoview.   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   Nocturia more than twice per night 04/24/2015   OA (osteoarthritis) of knee 01/06/2015   OSA on CPAP 03/20/2014   Palpitations 10/23/2013   Nocturia associated with benign prostatic hyperplasia 03/21/2013   Sleep apnea with use of continuous positive airway pressure (CPAP)     Coronary artery calcification seen on CAT scan 05/25/2012   Tachycardia 12/08/2011   Hypertension    Hyperlipidemia     Past Surgical History:  Procedure Laterality Date   CALCANEAL OSTEOTOMY Right 04/29/2016   Procedure: RIGHT CALCANEAL OSTEOTOMY, RIGHT MEDIAL CUNEIFORM OSTEOTOMY;  Surgeon: Norleen Armor, MD;  Location: [facility];  Service: Orthopedics;  Laterality: Right;   CARDIAC CATHETERIZATION  07/04/1997   COLON RESECTION  2004   x 2 per patient    COLON SURGERY     colon resection   COLONOSCOPY     ELBOW SURGERY Left 2011   INFECTED OLECRANON BURSITIS SURGERY   FOOT SURGERY Right    GASTROCNEMIUS RECESSION Right 04/29/2016   Procedure: RIGHT GASTROC RECESSION;  Surgeon: Norleen Armor, MD;  Location: [facility];  Service: Orthopedics;  Laterality: Right;   INGUINAL HERNIA REPAIR Right 06/03/2015   Procedure: REPAIR RIGHT INGUINAL HERNIA;  Surgeon: Dann Hummer, MD;  Location: [facility] OR;  Service: General;  Laterality: Right;   INSERTION OF MESH Right 06/03/2015   Procedure: INSERTION OF MESH;  Surgeon: Dann Hummer, MD;  [facility];  Service: General;  Laterality: Right;   JOINT REPLACEMENT Left    L4 DISCECTOMY     PARATHYROIDECTOMY Left  03/16/2019   Procedure: LEFT PARATHYROIDECTOMY;  Surgeon: Eletha Boas, MD;  Location: [facility];  Service: General;  Laterality: Left;   RETINAL DETACHMENT SURGERY  1998   SEPTIC JOINT Left    Elbow   TOTAL KNEE ARTHROPLASTY Left 01/06/2015   Procedure: LEFT TOTAL KNEE ARTHROPLASTY;  Surgeon: Dempsey Moan, MD;  Location: [facility];  Service: Orthopedics;  Laterality: Left;   TOTAL KNEE ARTHROPLASTY Right 10/03/2015   Procedure: RIGHT TOTAL KNEE ARTHROPLASTY;  Surgeon: Dempsey Moan, MD;  Location: [facility];  Service: Orthopedics;  Laterality: Right;   TRANSURETHRAL RESECTION OF PROSTATE         Home Medications    Prior to Admission medications   Medication Sig Start Date End Date Taking? Authorizing Provider   acetaminophen  (TYLENOL ) 500 MG tablet Take 500 mg by mouth at bedtime.    [provider]  atorvastatin  (LIPITOR) 40 MG tablet TAKE 1 TABLET BY MOUTH EVERY DAY 06/24/20   Jordan, Peter M, MD  B Complex Vitamins (VITAMIN B COMPLEX PO) 1 tablet Orally once a day    [provider]  cholecalciferol (VITAMIN D3) 25 MCG (1000 UT) tablet Take 1,000 Units by mouth daily.    [provider]  CIALIS  5 MG tablet Take 5 mg by mouth daily at 3 pm.  08/13/16   [provider]  colestipol (COLESTID) 1 g tablet Take 2 g by mouth at bedtime. 02/09/23   [provider]  diltiazem  (CARDIZEM  CD) 180 MG 24 hr capsule TAKE 1 CAPSULE (180 MG TOTAL) BY MOUTH AT BEDTIME. 02/28/23   Jordan, Peter M, MD  ezetimibe  (ZETIA ) 10 MG tablet Take 1 tablet (10 mg total) by mouth daily. 11/07/20   Jordan, Peter M, MD  fexofenadine (ALLEGRA) 180 MG tablet Take 180 mg by mouth daily.    [provider]  fluticasone (FLONASE) 50 MCG/ACT nasal spray Place 1 spray into both nostrils daily.     [provider]  hydrALAZINE  (APRESOLINE ) 25 MG tablet Take 1 tablet (25 mg total) by mouth 3 (three) times daily as needed. For elevated BP 07/28/23   Jordan, Peter M, MD  iron polysaccharides (NIFEREX) 150 MG capsule Take 150 mg by mouth daily. 08/18/22   [provider]  lidocaine  (LMX) 4 % cream Apply 1 application topically at bedtime as needed (foot pain).    [provider]  loratadine (CLARITIN) 10 MG tablet Take 10 mg by mouth daily as needed.    [provider]  LORazepam  (ATIVAN ) 1 MG tablet TAKE 1/2 TABLET (0.5 MG TOTAL) BY MOUTH 2 (TWO) TIMES DAILY AS NEEDED FOR ANXIETY. 06/29/23   Dohmeier, Dedra, MD  losartan  (COZAAR ) 50 MG tablet TAKE 2 TABLETS ( 100 MG ) DAILY 02/03/23   Jordan, Peter M, MD  melatonin 5 MG TABS Take 5 mg by mouth at bedtime.    [provider]  metoprolol  succinate (TOPROL -XL) 25 MG 24 hr tablet TAKE 2 TABLETS BY MOUTH  EVERY DAY 08/31/23   Jordan, Peter M, MD  metoprolol  succinate (TOPROL -XL) 25 MG 24 hr tablet Take 2 tablets (50 mg total) by mouth daily. 08/31/23   Jordan, Peter M, MD  Multiple Vitamin (MULTIVITAMIN WITH MINERALS) TABS tablet Take 1 tablet by mouth daily.    [provider]  NON FORMULARY CPAP MACHINE    [provider]  pantoprazole (PROTONIX) 40 MG tablet Take 40 mg by mouth every morning. 03/16/22   [provider]  Probiotic Product (PROBIOTIC DAILY PO)  Take 1 capsule by mouth daily.    [provider]  hyoscyamine (LEVSIN SL) 0.125 MG SL tablet  04/16/11 12/08/11  [provider]    Family History Family History  Problem Relation Age of Onset   Emphysema Mother    Heart attack Father    Hypertension Father    Hypertension Other    High Cholesterol Other     Social History Social History   Tobacco Use   Smoking status: Never   Smokeless tobacco: Never  Vaping Use   Vaping status: Never Used  Substance Use Topics   Alcohol use: Yes    Alcohol/week: 17.0 standard drinks of alcohol    Types: 8 Glasses of wine, 8 Shots of liquor, 1 Standard drinks or equivalent per week    Comment: each night 1 glass   Drug use: No     Allergies   Betadine [povidone iodine], Olmesartan, and Oxycodone    Review of Systems Review of Systems  Constitutional:  Negative for chills and fever.  HENT:  Positive for congestion.   Respiratory:  Positive for cough. Negative for shortness of breath and wheezing.   Cardiovascular: Negative.   Gastrointestinal: Negative.   Genitourinary: Negative.   Musculoskeletal: Negative.      Physical Exam Triage Vital Signs ED Triage Vitals  Encounter Vitals Group     BP 10/06/23 0959 137/73     Systolic BP Percentile --      Diastolic BP Percentile --      Pulse Rate 10/06/23 0959 60     Resp 10/06/23 0959 18     Temp 10/06/23 0959 (!) 97.5 F (36.4 C)     Temp Source 10/06/23 0959 Oral     SpO2  10/06/23 0959 98 %     Weight --      Height --      Head Circumference --      Peak Flow --      Pain Score 10/06/23 0958 0     Pain Loc --      Pain Education --      Exclude from Growth Chart --    No data found.  Updated Vital Signs BP 137/73 (BP Location: Right Arm)   Pulse 60   Temp (!) 97.5 F (36.4 C) (Oral)   Resp 18   SpO2 98%   Visual Acuity Right Eye Distance:   Left Eye Distance:   Bilateral Distance:    Right Eye Near:   Left Eye Near:    Bilateral Near:     Physical Exam Constitutional:      General: He is not in acute distress.    Appearance: Normal appearance. He is not ill-appearing.  HENT:     Nose: Congestion present. No rhinorrhea.     Mouth/Throat:     Mouth: Mucous membranes are moist.     Pharynx: No oropharyngeal exudate or posterior oropharyngeal erythema.  Cardiovascular:     Rate and Rhythm: Normal rate and regular rhythm.  Pulmonary:     Effort: Pulmonary effort is normal.     Breath sounds: Normal breath sounds. No wheezing or rhonchi.  Musculoskeletal:     Cervical back: Normal range of motion and neck supple. No tenderness.  Skin:    General: Skin is warm.  Neurological:     General: No focal deficit present.     Mental Status: He is alert.  Psychiatric:        Mood and Affect: Mood normal.  UC Treatments / Results  Labs (all labs ordered are listed, but only abnormal results are displayed) Labs Reviewed - No data to display  EKG   Radiology No results found.  Procedures Procedures (including critical care time)  Medications Ordered in UC Medications - No data to display  Initial Impression / Assessment and Plan / UC Course  I have reviewed the triage vital signs and the nursing notes.  Pertinent labs & imaging results that were available during my care of the patient were reviewed by me and considered in my medical decision making (see chart for details).   Final Clinical Impressions(s) / UC Diagnoses    Final diagnoses:  Viral URI with cough     Discharge Instructions      You were seen today for upper respiratory symptoms.  This appears viral at this time.  I have sent out a cough medication to take to help with your symptoms.  If you develops fever, chills, or shortness of breath with the cough then return to be re-evaluated.     ED Prescriptions     Medication Sig Dispense Auth. Provider   benzonatate  (TESSALON ) 200 MG capsule Take 1 capsule (200 mg total) by mouth 3 (three) times daily as needed for cough. 21 capsule Darral Longs, MD      PDMP not reviewed this encounter.   Darral Longs, MD 10/06/23 1013

## 2023-10-06 NOTE — ED Triage Notes (Signed)
 Pt had cough that started last night. Repots cough wouldn't stop. Monday started having scratchy throat. Leaving for [place] tomorrow and nees to be checked out.  Home covid was negative. Taking Tussin DM for cough.

## 2023-10-06 NOTE — Discharge Instructions (Signed)
 You were seen today for upper respiratory symptoms.  This appears viral at this time.  I have sent out a cough medication to take to help with your symptoms.  If you develops fever, chills, or shortness of breath with the cough then return to be re-evaluated.

## 2023-12-07 ENCOUNTER — Encounter: Payer: Self-pay | Admitting: Anesthesiology

## 2023-12-08 ENCOUNTER — Ambulatory Visit: Payer: Medicare Other | Admitting: Neurology

## 2023-12-08 ENCOUNTER — Encounter: Payer: Self-pay | Admitting: Neurology

## 2023-12-08 VITALS — BP 120/54 | HR 52 | Resp 16 | Ht 69.0 in

## 2023-12-08 DIAGNOSIS — G4733 Obstructive sleep apnea (adult) (pediatric): Secondary | ICD-10-CM | POA: Diagnosis not present

## 2023-12-08 NOTE — Progress Notes (Signed)
 Patient: Robert [name] Date of Birth: 1946/08/12  Reason for Visit: Follow up History from: Patient Primary Neurologist: Dohmeier   ASSESSMENT AND PLAN 78 y.o. year old male   OSA on CPAP  -Recommend continue nightly CPAP use for minimum 4 hours.  We will continue current settings.  His mask is leaking slightly.  He is trying to adjust so it does not irritate his nose.  He will continue to work with DME, determining a tolerable fit for the head gear and mask.  He should be eligible for a new CPAP machine in August 2025.  He may reach out to me and I can order HST before new CPAP machine.  We will otherwise follow-up in 1 year.  HISTORY OF PRESENT ILLNESS: Today 12/08/23 Here for CPAP revisit.  CPAP download shows 97% compliance, AHI 1.9, leak 30.7. 4-8 cm water. Set up August 2020. Using nasal mask with memory foam, noticing some redness to left nose. Trying to loosen some. Has BPH, on a bad night gets up about 5 times a night, best is twice a night. Recently tried sleeping without it on vacation, slept on his side didn't notice much difference. Without it he snores terribly, no way he could sleep in same bed with his wife. At times take nap in afternoon, wife will wake him up snoring. He feels great, is active, goes to gym 3 times a week. Going to [place] in September, Dr. Vickey Huger has sent in Ativan historically to help with sleep, last sent in October 2024.  Update 12/08/22 SS: Here today alone for CPAP revisit. Setup date was August 2020. His machine is doing fine. Using nasal mask resmed with memory foam. Review of CPAP download 11/08/2022-12/07/2022 shows 30/30 days greater than 4 hours 29/30 days at 97%.  Average usage 7 hours 16 minutes.  Minimum pressure 4, maximum pressure 8 cmH2O.  EPR level 1.  Leak 25.8.  AHI 3.4. he has some leak of the mask, he thinks he forgets to change the mask because it is so comfortable. He didn't use CPAP last night due to sore on his nose, he was tired  today, he took a nap. He has BPH has nocturia. ESS 4.   HISTORY  06/15/22 Dr. Vickey Huger HPI:  Robert [name] is a 78 y.o. male here as a revisit for Insomnia and OSA , first time since his long [place] and [place] trip with wife Robert [name]. He has actually a whole new set of questions.     Mr. Vantrease reported he  was recently seen for a scratchy throat by ENT Dr. Suszanne Conners and he suspected  that Mr. Robert [name] has GERD. He had  also recently a new GI MD, dx with Barretts esophagus, a 5 year colonoscopy and  endoscopy was scheduled. Also a CT of the lower abdomen and pelvis:   1. Small fat containing left inguinal hernia and tiny fat containing right inguinal hernia. 2. Prominent mesenteric lymph nodes, unchanged compared to prior CT. 3.  Aortic Atherosclerosis (ICD10-I70.0).      Has Neuropathy in his feet:  Dr. Suszanne Conners placed the patient on Omeprazole and gabapentin, and GI changed to pantoprazole.  Gabapentin was  used to "calm  throat nerves' and he didn't feel that was achieved but the general neuropathy he has is better- takes 600 mg a day now.  Cutting down on acidic foods,,caffeine and alcohol.     The OSA on CPAP is well controlled.   REVIEW OF SYSTEMS: Out of  a complete 14 system review of symptoms, the patient complains only of the following symptoms, and all other reviewed systems are negative.  See HPI  ALLERGIES: Allergies  Allergen Reactions   Betadine [Povidone Iodine] Other (See Comments)    Causes stinging of skin    Olmesartan     Other reaction(s): racing heart, palpitations, low diastlic   Oxycodone Other (See Comments)    nightmares    HOME MEDICATIONS: Outpatient Medications Prior to Visit  Medication Sig Dispense Refill   acetaminophen (TYLENOL) 500 MG tablet Take 500 mg by mouth at bedtime.     atorvastatin (LIPITOR) 40 MG tablet TAKE 1 TABLET BY MOUTH EVERY DAY 90 tablet 2   B Complex Vitamins (VITAMIN B COMPLEX PO) 1 tablet Orally once a day     cholecalciferol (VITAMIN  D3) 25 MCG (1000 UT) tablet Take 1,000 Units by mouth daily.     CIALIS 5 MG tablet Take 5 mg by mouth daily at 3 pm.      colestipol (COLESTID) 1 g tablet Take 1 g by mouth at bedtime.     diltiazem (CARDIZEM CD) 180 MG 24 hr capsule TAKE 1 CAPSULE (180 MG TOTAL) BY MOUTH AT BEDTIME. 90 capsule 3   doxylamine, Sleep, (UNISOM) 25 MG tablet Take 25 mg by mouth at bedtime as needed for sleep.     ezetimibe (ZETIA) 10 MG tablet Take 1 tablet (10 mg total) by mouth daily. 90 tablet 3   Ferrous Sulfate (IRON) 28 MG TABS Take 1 tablet by mouth in the morning.     fexofenadine (ALLEGRA) 180 MG tablet Take 180 mg by mouth daily.     fluticasone (FLONASE) 50 MCG/ACT nasal spray Place 1 spray into both nostrils daily.      hydrALAZINE (APRESOLINE) 25 MG tablet Take 1 tablet (25 mg total) by mouth 3 (three) times daily as needed. For elevated BP 270 tablet 3   iron polysaccharides (NIFEREX) 150 MG capsule Take 150 mg by mouth daily.     lidocaine (LMX) 4 % cream Apply 1 application topically at bedtime as needed (foot pain).     LORazepam (ATIVAN) 1 MG tablet TAKE 1/2 TABLET (0.5 MG TOTAL) BY MOUTH 2 (TWO) TIMES DAILY AS NEEDED FOR ANXIETY. 30 tablet 5   losartan (COZAAR) 50 MG tablet TAKE 2 TABLETS ( 100 MG ) DAILY 180 tablet 3   melatonin 5 MG TABS Take 5 mg by mouth at bedtime.     metoprolol succinate (TOPROL-XL) 25 MG 24 hr tablet Take 2 tablets (50 mg total) by mouth daily. (Patient taking differently: Take 37.5 mg by mouth daily.) 180 tablet 3   Multiple Vitamin (MULTIVITAMIN WITH MINERALS) TABS tablet Take 1 tablet by mouth daily.     NON FORMULARY CPAP MACHINE     pantoprazole (PROTONIX) 40 MG tablet Take 40 mg by mouth every morning.     Probiotic Product (PROBIOTIC DAILY PO) Take 1 capsule by mouth daily.     benzonatate (TESSALON) 200 MG capsule Take 1 capsule (200 mg total) by mouth 3 (three) times daily as needed for cough. 21 capsule 0   loratadine (CLARITIN) 10 MG tablet Take 10 mg by mouth  daily as needed.     metoprolol succinate (TOPROL-XL) 25 MG 24 hr tablet TAKE 2 TABLETS BY MOUTH EVERY DAY 60 tablet 11   No facility-administered medications prior to visit.    PAST MEDICAL HISTORY: Past Medical History:  Diagnosis Date   Anxiety  Arthritis    Knee , Neck   Colon cancer (HCC)    a. s/p colon resection in 2004 and 2002   Complication of anesthesia    difficulty urinating after   Edema of right foot    ? 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 following abnormal stress tests. Both reportedly normal;  b. 02/2013 nl myoview.   Sleep apnea with use of continuous positive airway pressure (CPAP)    wears CPAP nightly    PAST SURGICAL HISTORY: Past Surgical History:  Procedure Laterality Date   CALCANEAL OSTEOTOMY Right 04/29/2016   Procedure: RIGHT CALCANEAL OSTEOTOMY, RIGHT MEDIAL CUNEIFORM OSTEOTOMY;  Surgeon: Toni Arthurs, MD;  Location: [facility];  Service: Orthopedics;  Laterality: Right;   CARDIAC CATHETERIZATION  07/04/1997   COLON RESECTION  2004   x 2 per patient    COLON SURGERY     colon resection   COLONOSCOPY     ELBOW SURGERY Left 2011   INFECTED OLECRANON BURSITIS SURGERY   FOOT SURGERY Right    GASTROCNEMIUS RECESSION Right 04/29/2016   Procedure: RIGHT GASTROC RECESSION;  Surgeon: Toni Arthurs, MD;  Location: [facility];  Service: Orthopedics;  Laterality: Right;   INGUINAL HERNIA REPAIR Right 06/03/2015   Procedure: REPAIR RIGHT INGUINAL HERNIA;  Surgeon: Violeta Gelinas, MD;  Location: [facility] OR;  Service: General;  Laterality: Right;   INSERTION OF MESH Right 06/03/2015   Procedure: INSERTION OF MESH;  Surgeon: Violeta Gelinas, MD;  Location:  [facility];  Service: General;  Laterality: Right;   JOINT REPLACEMENT Left    L4 DISCECTOMY     PARATHYROIDECTOMY Left 03/16/2019   Procedure: LEFT PARATHYROIDECTOMY;  Surgeon: Darnell Level, MD;  Location: [facility];  Service: General;  Laterality: Left;   RETINAL DETACHMENT SURGERY  1998   SEPTIC JOINT Left    Elbow   TOTAL KNEE ARTHROPLASTY Left 01/06/2015   Procedure: LEFT TOTAL KNEE ARTHROPLASTY;  Surgeon: Ollen Gross, MD;  Location: [facility];  Service: Orthopedics;  Laterality: Left;   TOTAL KNEE ARTHROPLASTY Right 10/03/2015   Procedure: RIGHT TOTAL KNEE ARTHROPLASTY;  Surgeon: Ollen Gross, MD;  Location[facility];  Service: Orthopedics;  Laterality: Right;   TRANSURETHRAL RESECTION OF PROSTATE      FAMILY HISTORY: Family History  Problem Relation Age of Onset   Emphysema Mother    Heart attack Father    Hypertension Father    Hypertension Other    High Cholesterol Other     SOCIAL HISTORY: Social History   Socioeconomic History   Marital status: Married    Spouse name: Robert [name]   Number of children: 3   Years of education: Ph.D   Highest education level: Not on file  Occupational History   Occupation: ADMIN    Employer: [employer]    Comment: [employer]  Tobacco Use   Smoking status: Never   Smokeless tobacco: Never  Vaping Use   Vaping status: Never Used  Substance and Sexual Activity   Alcohol use: Yes    Alcohol/week: 17.0 standard drinks of alcohol    Types: 8 Glasses of wine, 8 Shots of liquor, 1 Standard drinks or equivalent  per week    Comment: each night 1 glass   Drug use: No   Sexual activity: Yes  Other Topics Concern   Not on file  Social History Narrative   Sleep apnea 28 AHI was reduced to 3.4- set to  7 cm water pressure on nasal mask eson or wisp.  He sleep  nonsupine, less nocturia.  Tennis ball method explained.  Patient does not want machine set to a certain pressure.     Downloaded CMS compliance s etsablsihed .  Insomnia not improved  further - will try seroquel as  medication approach and discussed behaviour therapy approach .   send for n beahiour therapy and started trial of SEROQUEL>  25 minute visit including CMS compliance and downloading.    Patient is married Robert [name]) and lives at home with his wife.   Patient has three children and his wife has three children.   Patient is working full-time.   Patient has a Ph.D.   Patient is right-handed.   Patient drinks about 21 oz of coffee daily.         Social Drivers of Corporate investment banker Strain: Not on file  Food Insecurity: Not on file  Transportation Needs: Not on file  Physical Activity: Not on file  Stress: Not on file  Social Connections: Not on file  Intimate Partner Violence: Not on file    PHYSICAL EXAM  Vitals:   12/08/23 1414  BP: (!) 120/54  Pulse: (!) 52  Resp: 16  Height: 5\' 9"  (1.753 m)   Body mass index is 26.76 kg/m.  Generalized: Well developed, in no acute distress  Neurological examination  Mentation: Alert oriented to time, place, history taking. Follows all commands speech and language fluent Cranial nerve II-XII: Pupils were equal round reactive to light.  Motor: Moves all extremities independent Gait and station: Gait is normal.   DIAGNOSTIC DATA (LABS, IMAGING, TESTING) - I reviewed patient records, labs, notes, testing and imaging myself where available.  Lab Results  Component Value Date   WBC 7.5 03/03/2021   HGB 15.8 03/03/2021   HCT 47.9 03/03/2021   MCV 89.4 03/03/2021   PLT 193 03/03/2021      Component Value Date/Time   NA 138 03/03/2021 2004   NA 140 01/20/2021 0915   K 4.1 03/03/2021 2004   CL 102 03/03/2021 2004   CO2 27 03/03/2021 2004   GLUCOSE 88 03/03/2021 2004   BUN 22 03/03/2021 2004   BUN 24 01/20/2021 0915   CREATININE 0.90 02/18/2022 0852   CREATININE 1.04 08/26/2016 0941   CALCIUM 9.6 03/03/2021 2004   PROT 7.3 03/03/2021 2004   PROT 6.3 01/20/2021 0915   ALBUMIN 4.8 03/03/2021  2004   ALBUMIN 4.4 01/20/2021 0915   AST 26 03/03/2021 2004   ALT 37 03/03/2021 2004   ALKPHOS 72 03/03/2021 2004   BILITOT 0.6 03/03/2021 2004   BILITOT 0.5 01/20/2021 0915   GFRNONAA >60 03/03/2021 2004   GFRAA 81 11/27/2019 0822   Lab Results  Component Value Date   CHOL 146 01/20/2021   HDL 53 01/20/2021   LDLCALC 73 01/20/2021   TRIG 109 01/20/2021   CHOLHDL 2.8 01/20/2021   No results found for: "HGBA1C" No results found for: "VITAMINB12" Lab Results  Component Value Date   TSH 1.13 10/23/2013    Margie Ege, AGNP-C, DNP 12/08/2023, 2:24 PM [facility] [address] [place], [place] [address] [phone]

## 2023-12-08 NOTE — Patient Instructions (Signed)
 Recommend continue nightly CPAP usage for minimum 4 hours.  You may be eligible for a new CPAP machine in August 2025.  He is reach out to me at that time.  We can otherwise addressed at her next visit in 1 year.  Thanks!!

## 2023-12-15 ENCOUNTER — Other Ambulatory Visit: Payer: Self-pay | Admitting: Cardiology

## 2024-01-02 ENCOUNTER — Telehealth: Payer: Self-pay | Admitting: Neurology

## 2024-01-02 ENCOUNTER — Encounter: Payer: Self-pay | Admitting: Neurology

## 2024-01-02 NOTE — Telephone Encounter (Signed)
 Records have been faxed to [facility] at [phone]. Confirmation received.

## 2024-01-02 NOTE — Telephone Encounter (Signed)
 Pt said, [facility] send a request for medical records to be able to send CPAP  supplies. Patient decided to send MyChart message

## 2024-02-09 NOTE — Progress Notes (Signed)
 Cardiology Office Note:    Date:  02/14/2024   ID:  Robert [name], DOB 1946-04-05, MRN [id number]  PCP:  Jeannine Milroy., MD  [facility] Cardiologist:  Otniel Hoe [place], MD  [facility] Electrophysiologist:  None   Referring MD: Jeannine Milroy., MD   Chief Complaint  Patient presents with   Hypertension    History of Present Illness:    Robert [name] is a 78 y.o. male with a hx of coronary calcification, OSA on CPAP, HTN and HLD.  Needs clearance for thumb surgery. There was incidental finding of coronary calcification in mid RCA distribution.  Previous Myoview in June 2014 was negative for ischemia.  EF was normal on the Myoview.  He had history of facial flushing with crestor  and pravastatin  however able to tolerate Lipitor.  There was concern of bradycardia and fatigue in January 2018.  Toprol  dose was reduced with improvement.  Heart monitor showed average heart rate of 64 with lowest heart rate 50.  EGD in June 2018 was normal.  He later developed heart palpitation and went back to prior Toprol -XL dose. He underwent parathyroidectomy for hyperparathyroidism.  Subsequent coronary CT showed minimal nonobstructive disease.  Due to hypotension, hydrochlorothiazide  was discontinued.  He does note that BP has been running a little higher. He feels it when BP > 130. Hydralazine  works well prn but wants to take something regularly.     Past Medical History:  Diagnosis Date   Anxiety    Arthritis    Knee , Neck   Colon cancer (HCC)    a. s/p colon resection in 2004 and 2002   Complication of anesthesia    difficulty urinating after   Edema of right foot    ? 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 following abnormal stress tests. Both reportedly normal;  b. 02/2013 nl myoview.   Sleep apnea with use of continuous positive airway pressure (CPAP)    wears CPAP nightly    Past Surgical History:  Procedure Laterality Date   CALCANEAL OSTEOTOMY Right 04/29/2016   Procedure: RIGHT CALCANEAL OSTEOTOMY, RIGHT MEDIAL CUNEIFORM OSTEOTOMY;  Surgeon: Amada Backer, MD;  Location: [facility];  Service: Orthopedics;  Laterality: Right;   CARDIAC CATHETERIZATION  07/04/1997   COLON RESECTION  2004   x 2 per patient    COLON SURGERY     colon resection   COLONOSCOPY     ELBOW SURGERY Left 2011   INFECTED OLECRANON BURSITIS SURGERY   FOOT SURGERY Right    GASTROCNEMIUS RECESSION Right 04/29/2016   Procedure: RIGHT GASTROC RECESSION;  Surgeon: Amada Backer, MD;  Location: [facility];  Service: Orthopedics;  Laterality: Right;   INGUINAL HERNIA REPAIR Right 06/03/2015   Procedure: REPAIR RIGHT INGUINAL HERNIA;  Surgeon: Dorena Gander, MD;  Location: [facility];  Service: General;  Laterality: Right;   INSERTION OF MESH Right 06/03/2015   Procedure: INSERTION OF MESH;  Surgeon: Dorena Gander, MD;  Location: [facility] OR;  Service: General;  Laterality: Right;   JOINT REPLACEMENT Left    L4 DISCECTOMY     PARATHYROIDECTOMY Left 03/16/2019   Procedure: LEFT PARATHYROIDECTOMY;  Surgeon: Oralee Billow, MD;  Location: [facility];  Service: General;  Laterality: Left;   RETINAL DETACHMENT SURGERY  1998   SEPTIC JOINT Left    Elbow   TOTAL KNEE ARTHROPLASTY Left 01/06/2015   Procedure: LEFT TOTAL KNEE ARTHROPLASTY;  Surgeon: Liliane Rei, MD;  Location: [facility];  Service: Orthopedics;  Laterality: Left;   TOTAL KNEE ARTHROPLASTY Right 10/03/2015   Procedure: RIGHT TOTAL KNEE ARTHROPLASTY;  Surgeon: Liliane Rei, MD;  Location: [facility];  Service: Orthopedics;  Laterality: Right;   TRANSURETHRAL RESECTION OF PROSTATE      Current Medications: Current Meds  Medication Sig    acetaminophen  (TYLENOL ) 500 MG tablet Take 500 mg by mouth at bedtime.   atorvastatin  (LIPITOR) 40 MG tablet TAKE 1 TABLET BY MOUTH EVERY DAY   B Complex Vitamins (VITAMIN B COMPLEX PO) 1 tablet Orally once a day   cholecalciferol (VITAMIN D3) 25 MCG (1000 UT) tablet Take 1,000 Units by mouth daily.   CIALIS  5 MG tablet Take 5 mg by mouth daily at 3 pm.    colestipol (COLESTID) 1 g tablet Take 1 g by mouth at bedtime.   diltiazem  (CARDIZEM  CD) 180 MG 24 hr capsule TAKE 1 CAPSULE (180 MG TOTAL) BY MOUTH AT BEDTIME.   doxylamine, Sleep, (UNISOM) 25 MG tablet Take 25 mg by mouth at bedtime as needed for sleep.   ezetimibe  (ZETIA ) 10 MG tablet Take 1 tablet (10 mg total) by mouth daily.   Ferrous Sulfate (IRON) 28 MG TABS Take 1 tablet by mouth in the morning.   fexofenadine (ALLEGRA) 180 MG tablet Take 180 mg by mouth daily.   fluticasone (FLONASE) 50 MCG/ACT nasal spray Place 1 spray into both nostrils daily.    hydrALAZINE  (APRESOLINE ) 25 MG tablet Take 1 tablet (25 mg total) by mouth 3 (three) times daily as needed. For elevated BP   hydrochlorothiazide  (MICROZIDE ) 12.5 MG capsule Take 1 capsule (12.5 mg total) by mouth daily.   iron polysaccharides (NIFEREX) 150 MG capsule Take 150 mg by mouth daily.   lidocaine  (LMX) 4 % cream Apply 1 application topically at bedtime as needed (foot pain).   LORazepam  (ATIVAN ) 1 MG tablet TAKE 1/2 TABLET (0.5 MG TOTAL) BY MOUTH 2 (TWO) TIMES DAILY AS NEEDED FOR ANXIETY.   losartan  (COZAAR ) 50 MG tablet TAKE 2 TABLETS BY MOUTH DAILY   melatonin 5 MG TABS Take 5 mg by mouth at bedtime.   metoprolol  succinate (TOPROL -XL) 25 MG 24 hr tablet Take 2 tablets (50 mg total) by mouth daily. (Patient taking differently: Take 37.5 mg by mouth daily.)   Multiple Vitamin (MULTIVITAMIN WITH MINERALS) TABS tablet Take 1 tablet by mouth daily.   NON FORMULARY CPAP MACHINE   pantoprazole (PROTONIX) 40 MG tablet Take 40 mg by mouth every morning.   Probiotic Product (PROBIOTIC  DAILY PO) Take 1 capsule by mouth daily.     Allergies:   Betadine [povidone iodine], Olmesartan, and Oxycodone    Social History   Socioeconomic History   Marital status: Married    Spouse name: Nonnie Beagle   Number of children: 3   Years of education: Ph.D   Highest education level: Not on file  Occupational History   Occupation: ADMIN    Employer: [employer]    Comment: [employer]  Tobacco Use   Smoking status: Never   Smokeless tobacco: Never  Vaping Use   Vaping status: Never Used  Substance and Sexual Activity   Alcohol use: Yes    Alcohol/week:  17.0 standard drinks of alcohol    Types: 8 Glasses of wine, 8 Shots of liquor, 1 Standard drinks or equivalent per week    Comment: each night 1 glass   Drug use: No   Sexual activity: Yes  Other Topics Concern   Not on file  Social History Narrative   Sleep apnea 28 AHI was reduced to 3.4- set to  7 cm water  pressure on nasal mask eson or wisp.  He sleep  nonsupine, less nocturia.  Tennis ball method explained.  Patient does not want machine set to a certain pressure.     Downloaded CMS compliance s etsablsihed .  Insomnia not improved further - will try seroquel as  medication approach and discussed behaviour therapy approach .   send for n beahiour therapy and started trial of SEROQUEL>  25 minute visit including CMS compliance and downloading.    Patient is married Nonnie Beagle) and lives at home with his wife.   Patient has three children and his wife has three children.   Patient is working full-time.   Patient has a Ph.D.   Patient is right-handed.   Patient drinks about 21 oz of coffee daily.         Social Drivers of Corporate investment banker Strain: Not on file  Food Insecurity: Not on file  Transportation Needs: Not on file  Physical Activity: Not on file  Stress: Not on file  Social Connections: Not on file     Family History: The patient's family history includes Emphysema in his mother; Heart attack  in his father; High Cholesterol in an other family member; Hypertension in his father and another family member.  ROS:   Please see the history of present illness.     All other systems reviewed and are negative.  EKGs/Labs/Other Studies Reviewed:    The following studies were reviewed today:  Coronary CT 11/01/2019 IMPRESSION: 1. Coronary calcium  score of 25.7. This was 23rd percentile for age and sex matched control.   2. Mild ascending and descending thoracic aortic wall calcifications   3. Normal coronary origin with right dominance.   4.  Minimal non obstructive calcified plaque in the mid RCA and LAD   5. CAD-RADS 1. Minimal non-obstructive CAD (0-24%). Consider non-atherosclerotic causes of chest pain. Consider preventive therapy and risk factor modification.         Recent Labs: No results found for requested labs within last 365 days.  Recent Lipid Panel    Component Value Date/Time   CHOL 146 01/20/2021 0916   TRIG 109 01/20/2021 0916   HDL 53 01/20/2021 0916   CHOLHDL 2.8 01/20/2021 0916   CHOLHDL 3.5 01/27/2017 0809   VLDL 34 (H) 01/27/2017 0809   LDLCALC 73 01/20/2021 0916   Dated 07/17/21: cholesterol 147, triglycerides 130, HDL 50, LDL 71. CMET and TSH normal. Dated 08/09/22: cholesterol 137, triglycerides 93, HDL 56, LDL 62. Normal CMET and TSH Dated 10/18/22: normal CBC  Physical Exam:    VS:  BP 126/76   Pulse (!) 57   Ht 5\' 9"  (1.753 m)   Wt 173 lb (78.5 kg)   SpO2 96%   BMI 25.55 kg/m     Wt Readings from Last 3 Encounters:  02/14/24 173 lb (78.5 kg)  07/20/23 181 lb 3.2 oz (82.2 kg)  12/08/22 175 lb 6.4 oz (79.6 kg)     GEN:  Well nourished, well developed in no acute distress HEENT: Normal NECK: No JVD; No carotid  bruits LYMPHATICS: No lymphadenopathy CARDIAC: RRR, no murmurs, rubs, gallops RESPIRATORY:  Clear to auscultation without rales, wheezing or rhonchi  ABDOMEN: Soft, non-tender, non-distended MUSCULOSKELETAL:  No edema;  No deformity  SKIN: Warm and dry NEUROLOGIC:  Alert and oriented x 3 PSYCHIATRIC:  Normal affect   ASSESSMENT:    1. Primary hypertension   2. Pure hypercholesterolemia   3. Coronary artery calcification        PLAN:    In order of problems listed above:  Coronary artery calcification: Previous coronary CT showed minimal disease  Hypertension: Blood pressure is a little higher We will continue his baseline therapy with Toprol , diltiazem , and losartan . We will try a lower dose of HCT 12.5 mg daily and monitor. Still has hydralazine  for BP spikes. Follow up in 6 months.  Hyperlipidemia: labs at goal in November   Medication Adjustments/Labs and Tests Ordered: Current medicines are reviewed at length with the patient today.  Concerns regarding medicines are outlined above.  No orders of the defined types were placed in this encounter.  Meds ordered this encounter  Medications   hydrochlorothiazide  (MICROZIDE ) 12.5 MG capsule    Sig: Take 1 capsule (12.5 mg total) by mouth daily.    Dispense:  90 capsule    Refill:  3    Patient Instructions  Medication Instructions:  Start HCT 12.5 mg daily Continue all other medications *If you need a refill on your cardiac medications before your next appointment, please call your pharmacy*  Lab Work: None ordered  Testing/Procedures: None ordered  Follow-Up: At [facility], you and your health needs are our priority.  As part of our continuing mission to provide you with exceptional heart care, our providers are all part of one team.  This team includes your primary Cardiologist (physician) and Advanced Practice Providers or APPs (Physician Assistants and Nurse Practitioners) who all work together to provide you with the care you need, when you need it.  Your next appointment:  6 months   Call in July to schedule Nov appointment     Provider:  Dr.Taja Pentland   We recommend signing up for the patient portal called  "MyChart".  Sign up information is provided on this After Visit Summary.  MyChart is used to connect with patients for Virtual Visits (Telemedicine).  Patients are able to view lab/test results, encounter notes, upcoming appointments, etc.  Non-urgent messages can be sent to your provider as well.   To learn more about what you can do with MyChart, go to [email or web address].         Signed, Artisha Capri [place], MD  02/14/2024 10:14 AM    [facility]

## 2024-02-10 ENCOUNTER — Other Ambulatory Visit: Payer: Self-pay | Admitting: Cardiology

## 2024-02-14 ENCOUNTER — Encounter: Payer: Self-pay | Admitting: Cardiology

## 2024-02-14 ENCOUNTER — Ambulatory Visit: Payer: Medicare Other | Attending: Cardiology | Admitting: Cardiology

## 2024-02-14 VITALS — BP 126/76 | HR 57 | Ht 69.0 in | Wt 173.0 lb

## 2024-02-14 DIAGNOSIS — I2510 Atherosclerotic heart disease of native coronary artery without angina pectoris: Secondary | ICD-10-CM

## 2024-02-14 DIAGNOSIS — I10 Essential (primary) hypertension: Secondary | ICD-10-CM | POA: Diagnosis not present

## 2024-02-14 DIAGNOSIS — E7800 Pure hypercholesterolemia, unspecified: Secondary | ICD-10-CM | POA: Diagnosis not present

## 2024-02-14 MED ORDER — HYDROCHLOROTHIAZIDE 12.5 MG PO CAPS: 12.5000 mg | ORAL_CAPSULE | Freq: Every day | ORAL | 3 refills | Status: AC

## 2024-02-14 NOTE — Patient Instructions (Signed)
 Medication Instructions:  Start HCT 12.5 mg daily Continue all other medications *If you need a refill on your cardiac medications before your next appointment, please call your pharmacy*  Lab Work: None ordered  Testing/Procedures: None ordered  Follow-Up: At [facility], you and your health needs are our priority.  As part of our continuing mission to provide you with exceptional heart care, our providers are all part of one team.  This team includes your primary Cardiologist (physician) and Advanced Practice Providers or APPs (Physician Assistants and Nurse Practitioners) who all work together to provide you with the care you need, when you need it.  Your next appointment:  6 months   Call in July to schedule Nov appointment     Provider:  Dr.Jordan   We recommend signing up for the patient portal called "MyChart".  Sign up information is provided on this After Visit Summary.  MyChart is used to connect with patients for Virtual Visits (Telemedicine).  Patients are able to view lab/test results, encounter notes, upcoming appointments, etc.  Non-urgent messages can be sent to your provider as well.   To learn more about what you can do with MyChart, go to [email or web address].

## 2024-03-08 ENCOUNTER — Encounter: Payer: Self-pay | Admitting: Cardiology

## 2024-03-08 ENCOUNTER — Other Ambulatory Visit: Payer: Self-pay | Admitting: Neurology

## 2024-03-16 NOTE — Telephone Encounter (Signed)
 Last visit: 12/08/23 Next visit: 12/11/24 Last fills:  Rx request sent to work-in provider today

## 2024-04-06 ENCOUNTER — Other Ambulatory Visit (HOSPITAL_COMMUNITY): Payer: Self-pay | Admitting: Medical

## 2024-04-06 ENCOUNTER — Ambulatory Visit (HOSPITAL_COMMUNITY)
Admission: RE | Admit: 2024-04-06 | Discharge: 2024-04-06 | Disposition: A | Discharge status: Home / Self Care | Source: Ambulatory Visit | Attending: Vascular Surgery | Admitting: Vascular Surgery

## 2024-04-06 DIAGNOSIS — M79605 Pain in left leg: Secondary | ICD-10-CM

## 2024-04-06 DIAGNOSIS — M7989 Other specified soft tissue disorders: Secondary | ICD-10-CM | POA: Diagnosis present

## 2024-04-06 DIAGNOSIS — M25579 Pain in unspecified ankle and joints of unspecified foot: Secondary | ICD-10-CM | POA: Insufficient documentation

## 2024-05-11 ENCOUNTER — Encounter: Payer: Self-pay | Admitting: Neurology

## 2024-05-11 DIAGNOSIS — G4733 Obstructive sleep apnea (adult) (pediatric): Secondary | ICD-10-CM

## 2024-05-14 NOTE — Telephone Encounter (Signed)
 Patient would like to pursue a new CPAP machine. Current machine setup in August 2020, should be eligible. I will place the order for HST to qualify for a new machine. Thanks  Orders Placed This Encounter  Procedures   Home sleep test

## 2024-05-22 ENCOUNTER — Ambulatory Visit (INDEPENDENT_AMBULATORY_CARE_PROVIDER_SITE_OTHER): Admitting: Podiatry

## 2024-05-22 ENCOUNTER — Encounter: Payer: Self-pay | Admitting: Podiatry

## 2024-05-22 DIAGNOSIS — L603 Nail dystrophy: Secondary | ICD-10-CM

## 2024-05-22 NOTE — Progress Notes (Signed)
 Subjective:  Patient ID: Robert [name], male    DOB: 04/11/1946,  MRN: [id number] HPI Chief Complaint  Patient presents with   Nail Problem    Toenails bilateral - discolored nails x years, dermatologist Rx'd topical previously but didn't help, 2nd toe left has thickened nail or callus-concerned it might be a wart   New Patient (Initial Visit)    78 y.o. male presents with the above complaint.   ROS: Denies fever chills Morform muscle aches pains calf pain back pain chest pain shortness of breath.  He is not only here for his thick nails but he is also here for painful ankle.  He states that he is currently wearing a brace from orthopedics and was told will continue to wear for the next few weeks.  He is questioning how he is welcome ankles as well.  Past Medical History:  Diagnosis Date   Anxiety    Arthritis    Knee , Neck   Colon cancer (HCC)    a. s/p colon resection in 2004 and 2002   Complication of anesthesia    difficulty urinating after   Edema of right foot    ? 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 following abnormal stress tests. Both reportedly normal;  b. 02/2013 nl myoview.   Sleep apnea with use of continuous positive airway pressure (CPAP)    wears CPAP nightly   Past Surgical History:  Procedure Laterality Date   CALCANEAL OSTEOTOMY Right 04/29/2016   Procedure: RIGHT CALCANEAL OSTEOTOMY, RIGHT MEDIAL CUNEIFORM OSTEOTOMY;  Surgeon: Norleen Armor, MD;  Location: [facility];  Service: Orthopedics;  Laterality: Right;   CARDIAC CATHETERIZATION  07/04/1997   COLON RESECTION  2004   x 2 per patient    COLON SURGERY     colon resection   COLONOSCOPY     ELBOW  SURGERY Left 2011   INFECTED OLECRANON BURSITIS SURGERY   FOOT SURGERY Right    GASTROCNEMIUS RECESSION Right 04/29/2016   Procedure: RIGHT GASTROC RECESSION;  Surgeon: Norleen Armor, MD;  Location: [facility];  Service: Orthopedics;  Laterality: Right;   INGUINAL HERNIA REPAIR Right 06/03/2015   Procedure: REPAIR RIGHT INGUINAL HERNIA;  Surgeon: Dann Hummer, MD;  [facility]: [facility];  Service: General;  Laterality: Right;   INSERTION OF MESH Right 06/03/2015   Procedure: INSERTION OF MESH;  Surgeon: Dann Hummer, MD;  Location: [facility];  Service: General;  Laterality: Right;   JOINT REPLACEMENT Left    L4 DISCECTOMY     PARATHYROIDECTOMY Left 03/16/2019   Procedure: LEFT PARATHYROIDECTOMY;  Surgeon: Eletha Boas, MD;  Location: [facility];  Service: General;  Laterality: Left;   RETINAL DETACHMENT SURGERY  1998   SEPTIC JOINT Left    Elbow   TOTAL KNEE ARTHROPLASTY Left 01/06/2015   Procedure: LEFT TOTAL KNEE ARTHROPLASTY;  Surgeon: Dempsey Moan, MD;  Location: [facility];  Service: Orthopedics;  Laterality: Left;   TOTAL KNEE ARTHROPLASTY Right 10/03/2015   Procedure: RIGHT TOTAL KNEE ARTHROPLASTY;  Surgeon: Dempsey Moan, MD;  Location: [facility];  Service: Orthopedics;  Laterality: Right;   TRANSURETHRAL RESECTION OF PROSTATE      Current  Outpatient Medications:    acetaminophen  (TYLENOL ) 500 MG tablet, Take 500 mg by mouth at bedtime., Disp: , Rfl:    atorvastatin  (LIPITOR) 40 MG tablet, TAKE 1 TABLET BY MOUTH EVERY DAY, Disp: 90 tablet, Rfl: 2   B Complex Vitamins (VITAMIN B COMPLEX PO), 1 tablet Orally once a day, Disp: , Rfl:    cholecalciferol (VITAMIN D3) 25 MCG (1000 UT) tablet, Take 1,000 Units by mouth daily., Disp: , Rfl:    CIALIS  5 MG tablet, Take 5 mg by mouth daily at 3 pm. , Disp: , Rfl:    diltiazem  (CARDIZEM  CD) 180 MG 24 hr capsule, TAKE 1 CAPSULE (180 MG TOTAL) BY MOUTH AT BEDTIME., Disp: 90 capsule, Rfl: 1   doxylamine, Sleep, (UNISOM) 25 MG tablet, Take 25 mg by mouth  at bedtime as needed for sleep., Disp: , Rfl:    ezetimibe  (ZETIA ) 10 MG tablet, Take 1 tablet (10 mg total) by mouth daily., Disp: 90 tablet, Rfl: 3   Ferrous Sulfate (IRON) 28 MG TABS, Take 1 tablet by mouth in the morning., Disp: , Rfl:    fexofenadine (ALLEGRA) 180 MG tablet, Take 180 mg by mouth daily., Disp: , Rfl:    fluticasone (FLONASE) 50 MCG/ACT nasal spray, Place 1 spray into both nostrils daily. , Disp: , Rfl:    hydrochlorothiazide  (MICROZIDE ) 12.5 MG capsule, Take 1 capsule (12.5 mg total) by mouth daily., Disp: 90 capsule, Rfl: 3   iron polysaccharides (NIFEREX) 150 MG capsule, Take 150 mg by mouth daily., Disp: , Rfl:    lidocaine  (LMX) 4 % cream, Apply 1 application topically at bedtime as needed (foot pain)., Disp: , Rfl:    LORazepam  (ATIVAN ) 1 MG tablet, TAKE 1/2 TABLET BY MOUTH 2 TIMES DAILY AS NEEDED FOR ANXIETY., Disp: 30 tablet, Rfl: 0   losartan  (COZAAR ) 50 MG tablet, TAKE 2 TABLETS BY MOUTH DAILY, Disp: 180 tablet, Rfl: 3   melatonin 5 MG TABS, Take 5 mg by mouth at bedtime., Disp: , Rfl:    metoprolol  succinate (TOPROL -XL) 25 MG 24 hr tablet, Take 2 tablets (50 mg total) by mouth daily. (Patient taking differently: Take 37.5 mg by mouth daily.), Disp: 180 tablet, Rfl: 3   Multiple Vitamin (MULTIVITAMIN WITH MINERALS) TABS tablet, Take 1 tablet by mouth daily., Disp: , Rfl:    NON FORMULARY, CPAP MACHINE, Disp: , Rfl:    pantoprazole (PROTONIX) 40 MG tablet, Take 40 mg by mouth every morning., Disp: , Rfl:    Probiotic Product (PROBIOTIC DAILY PO), Take 1 capsule by mouth daily., Disp: , Rfl:   Allergies  Allergen Reactions   Betadine [Povidone Iodine] Other (See Comments)    Causes stinging of skin    Olmesartan     Other reaction(s): racing heart, palpitations, low diastlic   Oxycodone  Other (See Comments)    nightmares   Povidone-Iodine     Other Reaction(s): Not available  povidone-iodine   Rosuvastatin      Other Reaction(s): flushing, Not available    Review of Systems Objective:  There were no vitals filed for this visit.  General: Well developed, nourished, in no acute distress, alert and oriented x3   Dermatological: Skin is warm, dry and supple bilateral. Nails x 10 are well maintained; remaining integument appears unremarkable at this time. There are no open sores, no preulcerative lesions, no rash or signs of infection present.  Toenails to the right foot are thick dystrophic palmarly subungual debris and he does have some tinea pedis around the  tissues of the hallux.  Vascular: Dorsalis Pedis artery and Posterior Tibial artery pedal pulses are 2/4 bilateral with immedate capillary fill time. Pedal hair growth present. No varicosities and no lower extremity edema present bilateral.   Neruologic: Grossly intact via light touch bilateral. Vibratory intact via tuning fork bilateral. Protective threshold with Semmes Wienstein monofilament intact to all pedal sites bilateral. Patellar and Achilles deep tendon reflexes 2+ bilateral. No Babinski or clonus noted bilateral.   Musculoskeletal: No gross boney pedal deformities bilateral. No pain, crepitus, or limitation noted with foot and ankle range of motion bilateral. Muscular strength 5/5 in all groups tested bilateral.  Gait: Unassisted, Nonantalgic.    Radiographs:  None taken  Assessment & Plan:   Assessment: Nail dystrophy possible onychomycosis right foot tinea pedis right foot possible ankle injury left  Plan: Discussed etiology pathology conservative surgical therapies at this point recommended samples of the nail to be taken for the lab.  Will follow-up with him in 1 month     Kieara Schwark T. [place], [place]

## 2024-05-29 ENCOUNTER — Telehealth: Payer: Self-pay | Admitting: Neurology

## 2024-05-29 DIAGNOSIS — G4730 Sleep apnea, unspecified: Secondary | ICD-10-CM

## 2024-05-29 DIAGNOSIS — R002 Palpitations: Secondary | ICD-10-CM

## 2024-05-29 DIAGNOSIS — I2510 Atherosclerotic heart disease of native coronary artery without angina pectoris: Secondary | ICD-10-CM

## 2024-05-29 DIAGNOSIS — N401 Enlarged prostate with lower urinary tract symptoms: Secondary | ICD-10-CM

## 2024-05-29 NOTE — Telephone Encounter (Signed)
 Richard from [facility] called to request A copy of pt Original Sleep study   Callback number is  [phone]  Fax# [phone]

## 2024-06-01 ENCOUNTER — Other Ambulatory Visit: Payer: Self-pay | Admitting: Podiatry

## 2024-06-04 ENCOUNTER — Ambulatory Visit: Payer: Self-pay | Admitting: Podiatry

## 2024-06-05 ENCOUNTER — Ambulatory Visit (INDEPENDENT_AMBULATORY_CARE_PROVIDER_SITE_OTHER): Admitting: Neurology

## 2024-06-05 DIAGNOSIS — N401 Enlarged prostate with lower urinary tract symptoms: Secondary | ICD-10-CM

## 2024-06-05 DIAGNOSIS — G4733 Obstructive sleep apnea (adult) (pediatric): Secondary | ICD-10-CM | POA: Diagnosis not present

## 2024-06-06 NOTE — Procedures (Signed)
 [facility]   HOME SLEEP TEST REPORT ( by Watch PAT)   STUDY DATE:  06-06-2024    ORDERING CLINICIAN:  Lauraine Born, NP  REFERRING CLINICIAN: Loreli Elsie BIRCH MD   CLINICAL INFORMATION/HISTORY:  05-14-2024; Patient would like to pursue a new CPAP machine. Current machine setup in August 2020, should be eligible. I will place the order for HST to qualify for a new machine.  Has nocturia with sleep apnea.   OSA on CPAP  Recommend continue nightly CPAP use for minimum 4 hours.  We will continue current settings.  His mask is leaking slightly.  He is trying to adjust so it does not irritate his nose.  He will continue to work with DME, determining a tolerable fit for the head gear and mask.  He should be eligible for a new CPAP machine in August 2025.  He may reach out to me and I can order HST before new CPAP machine.  We will otherwise follow-up in 1 year.ESS 4/ 24    HISTORY OF PRESENT ILLNESS: Today 12/08/23 Here for CPAP revisit.  CPAP download shows 97% compliance, AHI 1.9, leak 30.7. 4-8 cm water . Set up August 2020. Using nasal mask with memory foam, noticing some redness to left nose. Trying to loosen some. Has BPH, on a bad night gets up about 5 times a night, best is twice a night. Recently tried sleeping without it on vacation, slept on his side didn't notice much difference. Without it he snores terribly, no way he could sleep in same bed with his wife. At times take nap in afternoon, wife will wake him up snoring. He feels great, is active, goes to gym 3 times a week. Going to [place] in September, Dr. Chalice has sent in Ativan  historically to help with sleep, last sent in October 2024.     Epworth sleepiness score: 4/24. FSS: NA   BMI: 26 7 kg/m   Neck Circumference: na   FINDINGS:   Sleep Summary: This study as follows CMS criteria of scoring.   Total Recording Time (hours, min): 7 hours 34 minutes      Total Sleep Time (hours, min):    6 hours 19  minutes             Percent REM (%): 12.4%                                      Respiratory Indices:   Calculated pAHI (CMS guideline): 11.4/h, versus an AASM based AHI of 15.2/h                         REM pAHI:    24.6/h, clearly REM sleep dependent apnea                                             NREM pAHI:   9.5/h                           Positional AHI:   The patient slept in the lateral right position for 230 minutes associated with an AHI of 1.6/h versus a supine AHI of 39.5/h Snoring: Mean volume 40  dB which is a threshold of detection.  Only 2% of sleep were associated with snoring according to this home sleep test.                                               Oxygen Saturation Statistics:   Oxygen Saturation (%) Mean:     94%     O2 Saturation Range (%):    Between the nadir at 81% with a maximal saturation of 98%.                                   O2 Saturation (minutes) <89%:     Less than 1 minute      Pulse Rate Statistics:   Pulse Mean (bpm):       65 bpm          Pulse Range:   Between 49 and 89 bpm without information about cardiac rhythm.              IMPRESSION:  This HST confirms the presence of mild all obstructive sleep apnea with a strong REM sleep dependency and very strong positional dependency.  Avoiding supine sleep seems to be the key in treating this apnea.  The residual apnea is mild but still REM sleep dependent and the patient will continue to use positive airway pressure if desired.   RECOMMENDATION:  Continue CPAP with a mask of choice and comfort.  Auto CPAP by ResMed 4-9 cm water  , 1 cm EPR , heated humidification.   95% pressure on ResMed auto -CPAP has been 7 cm water .  The range of pressure was  4-8 cm water  , 1 cm EPR.  Air leak was high.   Continue to avoid supine sleep !  Enjoy your upcoming journey to [place].    Any patient should be cautioned not to drive, work at heights, or operate dangerous or heavy equipment when tired  or sleepy.   Review of good sleep hygiene measures is accessible to any sleep clinic patient and can be reiterated through online material- I we recommend the Guide to better Sleep   by the NIH.   Weight loss and Core Strength improvement is highly recommended for individuals with low muscle tone and/ or a BMI over 30.  Any CPAP patient should be reminded to be fully compliant with PAP therapy , (defined as using PAP therapy for more than 4 hours each night ) with the goal to improve sleep related symptoms and decrease long term cardiovascular risks. Any PAP therapy patient should be reminded, that it may take up to 3 months to get fully used to using PAP and it may take 1-2 weeks for an established CPAP user to acclimatize to changes in pressure or mask. The earlier full compliance is achieved, the better long term compliance tends to be.  Please note that untreated obstructive sleep apnea may carry additional perioperative morbidity. Patients with significant obstructive sleep apnea should receive perioperative PAP therapy and the surgical team should be informed of the diagnosis and degree of sleep disordered breathing.  The referring physician will be notified of the test results.     INTERPRETING PHYSICIAN:   Dedra Gores, MD  [facility] Sleep Board certified by [employer] of Sleep  Medicine and Diplomate of the American Academy of Sleep Medicine. Board certified In Neurology through the ABPN, Fellow of the [employer].

## 2024-06-06 NOTE — Telephone Encounter (Signed)
 Calculated pAHI (CMS guideline): 11.4/h, versus an AASM based AHI of 15.2/h                         REM pAHI:    24.6/h, clearly REM sleep dependent apnea                                             NREM pAHI:   9.5/h                           Positional AHI:   The patient slept in the lateral right position for 230 minutes associated with an AHI of 1.6/h versus a supine AHI of 39.5/h Snoring: Mean volume 40 dB which is a threshold of detection.  Only 2% of sleep were associated with snoring according to this home sleep test.                                                Ordered CPAP device, will need follow up with Sarah in 3-4 months.

## 2024-06-06 NOTE — Progress Notes (Signed)
 [facility]   HOME SLEEP TEST REPORT ( by Watch PAT)   STUDY DATE:  06-06-2024    ORDERING CLINICIAN:  Lauraine Born, NP  REFERRING CLINICIAN: Loreli Elsie BIRCH MD   CLINICAL INFORMATION/HISTORY:  05-14-2024; Patient would like to pursue a new CPAP machine. Current machine setup in August 2020, should be eligible. I will place the order for HST to qualify for a new machine.  Has nocturia with sleep apnea.   OSA on CPAP  Recommend continue nightly CPAP use for minimum 4 hours.  We will continue current settings.  His mask is leaking slightly.  He is trying to adjust so it does not irritate his nose.  He will continue to work with DME, determining a tolerable fit for the head gear and mask.  He should be eligible for a new CPAP machine in August 2025.  He may reach out to me and I can order HST before new CPAP machine.  We will otherwise follow-up in 1 year.ESS 4/ 24    HISTORY OF PRESENT ILLNESS: Today 12/08/23 Here for CPAP revisit.  CPAP download shows 97% compliance, AHI 1.9, leak 30.7. 4-8 cm water . Set up August 2020. Using nasal mask with memory foam, noticing some redness to left nose. Trying to loosen some. Has BPH, on a bad night gets up about 5 times a night, best is twice a night. Recently tried sleeping without it on vacation, slept on his side didn't notice much difference. Without it he snores terribly, no way he could sleep in same bed with his wife. At times take nap in afternoon, wife will wake him up snoring. He feels great, is active, goes to gym 3 times a week. Going to [place] in September, Dr. Chalice has sent in Ativan  historically to help with sleep, last sent in October 2024.     Epworth sleepiness score: 4/24. FSS: NA   BMI: 26 7 kg/m   Neck Circumference: na   FINDINGS:   Sleep Summary: This study as follows CMS criteria of scoring.   Total Recording Time (hours, min): 7 hours 34 minutes      Total Sleep Time (hours, min):    6 hours 19  minutes             Percent REM (%): 12.4%                                      Respiratory Indices:   Calculated pAHI (CMS guideline): 11.4/h, versus an AASM based AHI of 15.2/h                         REM pAHI:    24.6/h, clearly REM sleep dependent apnea                                             NREM pAHI:   9.5/h                           Positional AHI:   The patient slept in the lateral right position for 230 minutes associated with an AHI of 1.6/h versus a supine AHI of 39.5/h Snoring: Mean volume 40  dB which is a threshold of detection.  Only 2% of sleep were associated with snoring according to this home sleep test.                                               Oxygen Saturation Statistics:   Oxygen Saturation (%) Mean:     94%     O2 Saturation Range (%):    Between the nadir at 81% with a maximal saturation of 98%.                                   O2 Saturation (minutes) <89%:     Less than 1 minute      Pulse Rate Statistics:   Pulse Mean (bpm):       65 bpm          Pulse Range:   Between 49 and 89 bpm without information about cardiac rhythm.              IMPRESSION:  This HST confirms the presence of mild all obstructive sleep apnea with a strong REM sleep dependency and very strong positional dependency.  Avoiding supine sleep seems to be the key in treating this apnea.  The residual apnea is mild but still REM sleep dependent and the patient will continue to use positive airway pressure if desired.   RECOMMENDATION:  Continue CPAP with a mask of choice and comfort.  Auto CPAP by ResMed 4-9 cm water  , 1 cm EPR , heated humidification.   95% pressure on ResMed auto -CPAP has been 7 cm water .  The range of pressure was  4-8 cm water  , 1 cm EPR.  Air leak was high.   Continue to avoid supine sleep !  Enjoy your upcoming journey to [place].    Any patient should be cautioned not to drive, work at heights, or operate dangerous or heavy equipment when tired  or sleepy.   Review of good sleep hygiene measures is accessible to any sleep clinic patient and can be reiterated through online material- I we recommend the Guide to better Sleep   by the NIH.   Weight loss and Core Strength improvement is highly recommended for individuals with low muscle tone and/ or a BMI over 30.  Any CPAP patient should be reminded to be fully compliant with PAP therapy , (defined as using PAP therapy for more than 4 hours each night ) with the goal to improve sleep related symptoms and decrease long term cardiovascular risks. Any PAP therapy patient should be reminded, that it may take up to 3 months to get fully used to using PAP and it may take 1-2 weeks for an established CPAP user to acclimatize to changes in pressure or mask. The earlier full compliance is achieved, the better long term compliance tends to be.  Please note that untreated obstructive sleep apnea may carry additional perioperative morbidity. Patients with significant obstructive sleep apnea should receive perioperative PAP therapy and the surgical team should be informed of the diagnosis and degree of sleep disordered breathing.  The referring physician will be notified of the test results.     INTERPRETING PHYSICIAN:   Dedra Gores, MD  [facility] Sleep Board certified by [employer] of Sleep  Medicine and Diplomate of the American Academy of Sleep Medicine. Board certified In Neurology through the ABPN, Fellow of the [employer].

## 2024-06-07 NOTE — Telephone Encounter (Signed)
 Called and scheduled appt

## 2024-07-03 ENCOUNTER — Ambulatory Visit (INDEPENDENT_AMBULATORY_CARE_PROVIDER_SITE_OTHER): Admitting: Podiatrist

## 2024-07-03 ENCOUNTER — Ambulatory Visit: Admitting: Podiatry

## 2024-07-03 ENCOUNTER — Encounter: Payer: Self-pay | Admitting: Podiatrist

## 2024-07-03 DIAGNOSIS — M8580 Other specified disorders of bone density and structure, unspecified site: Secondary | ICD-10-CM | POA: Insufficient documentation

## 2024-07-03 DIAGNOSIS — I209 Angina pectoris, unspecified: Secondary | ICD-10-CM | POA: Insufficient documentation

## 2024-07-03 DIAGNOSIS — L603 Nail dystrophy: Secondary | ICD-10-CM

## 2024-07-03 DIAGNOSIS — Z9889 Other specified postprocedural states: Secondary | ICD-10-CM | POA: Insufficient documentation

## 2024-07-03 DIAGNOSIS — Z79899 Other long term (current) drug therapy: Secondary | ICD-10-CM

## 2024-07-03 DIAGNOSIS — E892 Postprocedural hypoparathyroidism: Secondary | ICD-10-CM | POA: Insufficient documentation

## 2024-07-03 DIAGNOSIS — D72819 Decreased white blood cell count, unspecified: Secondary | ICD-10-CM | POA: Insufficient documentation

## 2024-07-03 DIAGNOSIS — N200 Calculus of kidney: Secondary | ICD-10-CM | POA: Insufficient documentation

## 2024-07-03 DIAGNOSIS — K2940 Chronic atrophic gastritis without bleeding: Secondary | ICD-10-CM | POA: Insufficient documentation

## 2024-07-03 DIAGNOSIS — Z85038 Personal history of other malignant neoplasm of large intestine: Secondary | ICD-10-CM | POA: Insufficient documentation

## 2024-07-03 DIAGNOSIS — D500 Iron deficiency anemia secondary to blood loss (chronic): Secondary | ICD-10-CM | POA: Insufficient documentation

## 2024-07-03 DIAGNOSIS — I700 Atherosclerosis of aorta: Secondary | ICD-10-CM | POA: Insufficient documentation

## 2024-07-03 MED ORDER — TERBINAFINE HCL 250 MG PO TABS: 250.0000 mg | ORAL_TABLET | Freq: Every day | ORAL | 0 refills | Status: DC

## 2024-07-03 NOTE — Progress Notes (Signed)
 Robert [name] presents today to discuss BAKO results from his recent nail culture.   He relates his nails look bad and he is wondering what treatment options are available.  BAKO pathology report is positive for onychomycosis with a total dystrophic patetern of growth.  Oral terbinafine recommended for systemic therapy per the report.  Assessment:    ICD-10-CM   1. Nail dystrophy  L60.3     2. Encounter for long-term (current) use of medications  Z79.899 Comprehensive metabolic panel with GFR     Plan: Discussed topical, laser and systemic/ oral lamisil for the nail fungus.  Due to his involvement, Lamisil is the recommended treatment.  I will order a comprehensive metabolic panel and if normal, I will call in the Lamisil for 30 days to [facility] [place]. He will return in 30 days for a second blood test and to check to see how he is doing on the medication.  I will contact Anurag is there is any problem in starting the Lamisil due to blood work.

## 2024-07-04 LAB — COMPREHENSIVE METABOLIC PANEL WITH GFR
AG Ratio: 2 (calc) (ref 1.0–2.5)
ALT: 33 U/L (ref 9–46)
AST: 24 U/L (ref 10–35)
Albumin: 4.5 g/dL (ref 3.6–5.1)
Alkaline phosphatase (APISO): 76 U/L (ref 35–144)
BUN: 22 mg/dL (ref 7–25)
CO2: 27 mmol/L (ref 20–32)
Calcium: 9.6 mg/dL (ref 8.6–10.3)
Chloride: 103 mmol/L (ref 98–110)
Creat: 1.17 mg/dL (ref 0.70–1.28)
Globulin: 2.3 g/dL (ref 1.9–3.7)
Glucose, Bld: 127 mg/dL (ref 65–139)
Potassium: 3.9 mmol/L (ref 3.5–5.3)
Sodium: 140 mmol/L (ref 135–146)
Total Bilirubin: 0.6 mg/dL (ref 0.2–1.2)
Total Protein: 6.8 g/dL (ref 6.1–8.1)
eGFR: 64 mL/min/{1.73_m2}

## 2024-07-05 ENCOUNTER — Other Ambulatory Visit: Payer: Self-pay | Admitting: Cardiology

## 2024-07-05 DIAGNOSIS — R002 Palpitations: Secondary | ICD-10-CM

## 2024-07-07 ENCOUNTER — Other Ambulatory Visit: Payer: Self-pay | Admitting: Podiatrist

## 2024-07-07 ENCOUNTER — Encounter: Payer: Self-pay | Admitting: Podiatrist

## 2024-07-07 ENCOUNTER — Ambulatory Visit: Payer: Self-pay | Admitting: Podiatrist

## 2024-07-07 NOTE — Progress Notes (Signed)
 Comprehensive metabolic panel checked-  all labs look great-  OK to start lamisil.

## 2024-07-11 ENCOUNTER — Encounter: Payer: Self-pay | Admitting: Cardiology

## 2024-07-29 ENCOUNTER — Other Ambulatory Visit: Payer: Self-pay | Admitting: Podiatrist

## 2024-08-06 ENCOUNTER — Ambulatory Visit: Admitting: Podiatry

## 2024-08-06 ENCOUNTER — Encounter: Payer: Self-pay | Admitting: Podiatry

## 2024-08-06 VITALS — Ht 69.0 in | Wt 173.0 lb

## 2024-08-06 DIAGNOSIS — B351 Tinea unguium: Secondary | ICD-10-CM | POA: Diagnosis not present

## 2024-08-06 NOTE — Progress Notes (Signed)
 Chief Complaint  Patient presents with   Nail Problem    Pt is here to f/u on toenail fungus to the left foot, states he has been taking medication for 30 days, has been taking medication for 30 days, states he notice some changes but not major.    Subjective: 78 y.o. male presenting today for follow-up evaluation of fungal nail infection to the bilateral feet RT > LT. he was prescribed 90 days of oral Lamisil.  He has now completed 30 days.  Tolerating well  Past Medical History:  Diagnosis Date   Angina pectoris, unspecified 07/03/2024   Anxiety    Arthritis    Knee , Neck   Colon cancer (HCC)    a. s/p colon resection in 2004 and 2002   Complication of anesthesia    difficulty urinating after   Edema of right foot    ? 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 following abnormal stress tests. Both reportedly normal;  b. 02/2013 nl myoview.   Sleep apnea with use of continuous positive airway pressure (CPAP)    wears CPAP nightly    Past Surgical History:  Procedure Laterality Date   CALCANEAL OSTEOTOMY Right 04/29/2016   Procedure: RIGHT CALCANEAL OSTEOTOMY, RIGHT MEDIAL CUNEIFORM OSTEOTOMY;  Surgeon: Norleen Armor, MD;  Location: [facility];  Service: Orthopedics;  Laterality: Right;   CARDIAC CATHETERIZATION  07/04/1997   COLON RESECTION  2004   x 2 per patient    COLON SURGERY     colon resection   COLONOSCOPY     ELBOW SURGERY Left 2011   INFECTED OLECRANON BURSITIS SURGERY   FOOT SURGERY Right    GASTROCNEMIUS RECESSION Right 04/29/2016   Procedure: RIGHT GASTROC RECESSION;  Surgeon: Norleen Armor, MD;  Location: [facility];  Service: Orthopedics;  Laterality:  Right;   INGUINAL HERNIA REPAIR Right 06/03/2015   Procedure: REPAIR RIGHT INGUINAL HERNIA;  Surgeon: Dann Hummer, MD;  Location: [facility] OR;  Service: General;  Laterality: Right;   INSERTION OF MESH Right 06/03/2015   Procedure: INSERTION OF MESH;  Surgeon: Dann Hummer, MD;  Location: [facility] OR;  Service: General;  Laterality: Right;   JOINT REPLACEMENT Left    L4 DISCECTOMY     PARATHYROIDECTOMY Left 03/16/2019   Procedure: LEFT PARATHYROIDECTOMY;  Surgeon: Eletha Boas, MD;  Location: [facility];  Service: General;  Laterality: Left;   RETINAL DETACHMENT SURGERY  1998   SEPTIC JOINT Left    Elbow   TOTAL KNEE ARTHROPLASTY Left 01/06/2015   Procedure: LEFT TOTAL KNEE ARTHROPLASTY;  Surgeon: Dempsey Moan, MD;  Location: [facility];  Service: Orthopedics;  Laterality: Left;   TOTAL KNEE ARTHROPLASTY Right 10/03/2015   Procedure: RIGHT TOTAL KNEE ARTHROPLASTY;  Surgeon: Dempsey Moan, MD;  Location: [facility];  Service: Orthopedics;  Laterality: Right;   TRANSURETHRAL RESECTION OF PROSTATE      Allergies  Allergen Reactions   Betadine [Povidone Iodine] Other (See Comments)    Causes stinging of skin    Oxycodone  Other (See Comments)    nightmares   Povidone-Iodine     Other Reaction(s): Not available  povidone-iodine    RT foot 08/06/2024  Objective: Physical Exam General: The patient is alert and oriented x3 in no acute distress.  Dermatology: Hyperkeratotic, discolored, thickened, onychodystrophy noted. Skin is warm, dry and supple bilateral lower extremities. Negative for open lesions or macerations.  Vascular: Palpable pedal pulses bilaterally. No edema or erythema noted. Capillary refill within normal limits.  Neurological: Grossly intact via light touch  Musculoskeletal Exam: No pedal deformity noted  Assessment: #1 Onychomycosis of toenails right  Plan of Care:  -Patient evaluated -Has completed 30-day supply of oral Lamisil.  He has an additional 60 days of Lamisil to continue.   Continue as prescribed -CMP 07/04/2024 liver function WNL -Recommend completing the 90 days of the oral Lamisil.  Return to clinic 6 months for reevaluation   Thresa EMERSON Sar, DPM [facility]  Dr. Thresa EMERSON Sar, DPM    [address] [place], [place] [address]                Office [phone]  Fax [phone]

## 2024-08-10 LAB — LAB REPORT - SCANNED
EGFR: 72.5
EGFR: 87.7

## 2024-08-20 ENCOUNTER — Other Ambulatory Visit: Payer: Self-pay | Admitting: Podiatry

## 2024-08-21 ENCOUNTER — Telehealth: Payer: Self-pay | Admitting: Lab

## 2024-08-21 NOTE — Telephone Encounter (Signed)
 Patient call upset his Lamisil  was denied I explained he needed lab work once medication had be started he stated will have his PCP send over labs he just had done so we can send his medication in.

## 2024-08-27 ENCOUNTER — Other Ambulatory Visit: Payer: Self-pay | Admitting: Podiatry

## 2024-08-27 MED ORDER — TERBINAFINE HCL 250 MG PO TABS: 250.0000 mg | ORAL_TABLET | Freq: Every day | ORAL | 0 refills | Status: AC

## 2024-08-29 ENCOUNTER — Other Ambulatory Visit: Payer: Self-pay | Admitting: Neurology

## 2024-08-30 NOTE — Telephone Encounter (Signed)
 Medication: Lorazepam  Directions: TAKE 1/2 TABLET BY MOUTH 2 TIMES DAILY AS NEEDED FOR ANXIETY  Last given: 03/16/2024 Number refills:0  Last o/v:  Follow up:  Labs:    Please review refill request.

## 2024-08-30 NOTE — Telephone Encounter (Signed)
 Please review

## 2024-10-01 NOTE — Progress Notes (Unsigned)
 " Cardiology Office Note:    Date:  10/03/2024   ID:  Robert [name], DOB 1946/08/13, MRN [id number]  PCP:  Robert Elsie JONETTA Mickey., MD  [facility] Cardiologist:  Robert Heinzman, MD  [facility] Electrophysiologist:  None   Referring MD: Robert Elsie JONETTA Mickey., MD   Chief Complaint  Patient presents with   Hyperlipidemia   Hypertension    History of Present Illness:    Robert [name] is a 79 y.o. male with a hx of coronary calcification, OSA on CPAP, HTN and HLD.  Needs clearance for thumb surgery. There was incidental finding of coronary calcification in mid RCA distribution.  Previous Myoview in June 2014 was negative for ischemia.  EF was normal on the Myoview.  He had history of facial flushing with crestor  and pravastatin  however able to tolerate Lipitor.  There was concern of bradycardia and fatigue in January 2018.  Toprol  dose was reduced with improvement.  Heart monitor showed average heart rate of 64 with lowest heart rate 50.  EGD in June 2018 was normal.  He later developed heart palpitation and went back to prior Toprol -XL dose. He underwent parathyroidectomy for hyperparathyroidism.  Subsequent coronary CT showed minimal nonobstructive disease.  Due to hypotension, hydrochlorothiazide  was discontinued.  On follow up today he is doing well. Mainly complains of back pain from scoliosis. BP well controlled. No chest pain or edema. Tolerating medications well.    Past Medical History:  Diagnosis Date   Angina pectoris, unspecified 07/03/2024   Anxiety    Arthritis    Knee , Neck   Colon cancer (HCC)    a. s/p colon resection in 2004 and 2002   Complication of anesthesia    difficulty urinating after   Edema of right foot    ? 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 following abnormal stress tests. Both reportedly normal;  b. 02/2013 nl myoview.   Sleep apnea with use of continuous positive airway pressure (CPAP)    wears CPAP nightly    Past Surgical History:  Procedure Laterality Date   CALCANEAL OSTEOTOMY Right 04/29/2016   Procedure: RIGHT CALCANEAL OSTEOTOMY, RIGHT MEDIAL CUNEIFORM OSTEOTOMY;  Surgeon: Norleen Armor, MD;  Location: [facility];  Service: Orthopedics;  Laterality: Right;   CARDIAC CATHETERIZATION  07/04/1997   COLON RESECTION  2004   x 2 per patient    COLON SURGERY     colon resection   COLONOSCOPY     ELBOW SURGERY Left 2011   INFECTED OLECRANON BURSITIS SURGERY   FOOT SURGERY Right    GASTROCNEMIUS RECESSION Right 04/29/2016   Procedure: RIGHT GASTROC RECESSION;  Surgeon: Norleen Armor, MD;  Location: [facility];  Service: Orthopedics;  Laterality: Right;   INGUINAL HERNIA REPAIR Right 06/03/2015   Procedure: REPAIR RIGHT INGUINAL HERNIA;  Surgeon: Dann Hummer, MD;  Location: [facility] OR;  Service: General;  Laterality: Right;   INSERTION OF MESH Right 06/03/2015   Procedure: INSERTION OF MESH;  Surgeon: Dann Hummer, MD;  Location: [facility] OR;  Service: General;  Laterality: Right;   JOINT REPLACEMENT Left    L4 DISCECTOMY     PARATHYROIDECTOMY  Left 03/16/2019   Procedure: LEFT PARATHYROIDECTOMY;  Surgeon: Eletha Boas, MD;  Location: [facility];  Service: General;  Laterality: Left;   RETINAL DETACHMENT SURGERY  1998   SEPTIC JOINT Left    Elbow   TOTAL KNEE ARTHROPLASTY Left 01/06/2015   Procedure: LEFT TOTAL KNEE ARTHROPLASTY;  Surgeon: Dempsey Moan, MD;  Location: [facility];  Service: Orthopedics;  Laterality: Left;   TOTAL KNEE ARTHROPLASTY Right 10/03/2015   Procedure: RIGHT TOTAL KNEE ARTHROPLASTY;  Surgeon: Dempsey Moan, MD;  Location: [facility];  Service: Orthopedics;  Laterality: Right;   TRANSURETHRAL RESECTION OF PROSTATE       Current Medications: Current Meds  Medication Sig   acetaminophen  (TYLENOL ) 500 MG tablet Take 500 mg by mouth at bedtime.   atorvastatin  (LIPITOR) 40 MG tablet TAKE 1 TABLET BY MOUTH EVERY DAY   B Complex Vitamins (VITAMIN B COMPLEX PO) 1 tablet Orally once a day   cholecalciferol (VITAMIN D3) 25 MCG (1000 UT) tablet Take 1,000 Units by mouth daily.   CIALIS  5 MG tablet Take 5 mg by mouth daily at 3 pm.    diltiazem  (CARDIZEM  CD) 180 MG 24 hr capsule TAKE 1 CAPSULE (180 MG TOTAL) BY MOUTH AT BEDTIME.   doxylamine, Sleep, (UNISOM) 25 MG tablet Take 25 mg by mouth at bedtime as needed for sleep.   ezetimibe  (ZETIA ) 10 MG tablet Take 1 tablet (10 mg total) by mouth daily.   famotidine (PEPCID AC) 10 MG tablet Take 10 mg at bedtime   Ferrous Sulfate (IRON) 28 MG TABS Take 1 tablet by mouth in the morning.   fexofenadine (ALLEGRA) 180 MG tablet Take 180 mg by mouth daily.   fluticasone (FLONASE) 50 MCG/ACT nasal spray Place 1 spray into both nostrils daily.    hydrochlorothiazide  (MICROZIDE ) 12.5 MG capsule Take 1 capsule (12.5 mg total) by mouth daily.   iron polysaccharides (NIFEREX) 150 MG capsule Take 150 mg by mouth daily.   lidocaine  (LMX) 4 % cream Apply 1 application topically at bedtime as needed (foot pain).   LORazepam  (ATIVAN ) 1 MG tablet Take 0.5 tablets (0.5 mg total) by mouth 2 (two) times daily.   losartan  (COZAAR ) 50 MG tablet TAKE 2 TABLETS BY MOUTH DAILY   melatonin 5 MG TABS Take 5 mg by mouth at bedtime.   metoprolol  succinate (TOPROL  XL) 25 MG 24 hr tablet Take 1&1/2 tablets daily   Multiple Vitamin (MULTIVITAMIN WITH MINERALS) TABS tablet Take 1 tablet by mouth daily.   NON FORMULARY CPAP MACHINE   pantoprazole (PROTONIX) 40 MG tablet Take 40 mg by mouth every morning.   Probiotic Product (PROBIOTIC DAILY PO) Take 1 capsule by mouth daily.   Tadalafil  (CIALIS ) 2.5 MG TABS Take 2.5 mg daily   tadalafil  (CIALIS ) 5 MG tablet Take 5 mg daily     Allergies:    Betadine [povidone iodine], Oxycodone , and Povidone-iodine   Social History   Socioeconomic History   Marital status: Married    Spouse name: Ginny   Number of children: 3   Years of education: Ph.D   Highest education level: Not on file  Occupational History   Occupation: ADMIN    Employer: [employer]    Comment: [employer]  Tobacco Use   Smoking status: Never   Smokeless tobacco: Never  Vaping Use   Vaping status: Never Used  Substance and Sexual Activity   Alcohol use: Yes    Alcohol/week: 17.0 standard drinks of alcohol    Types: 8 Glasses of  wine, 8 Shots of liquor, 1 Standard drinks or equivalent per week    Comment: each night 1 glass   Drug use: No   Sexual activity: Yes  Other Topics Concern   Not on file  Social History Narrative   Sleep apnea 28 AHI was reduced to 3.4- set to  7 cm water  pressure on nasal mask eson or wisp.  He sleep  nonsupine, less nocturia.  Tennis ball method explained.  Patient does not want machine set to a certain pressure.     Downloaded CMS compliance s etsablsihed .  Insomnia not improved further - will try seroquel as  medication approach and discussed behaviour therapy approach .   send for n beahiour therapy and started trial of SEROQUEL>  25 minute visit including CMS compliance and downloading.    Patient is married Jeremiah) and lives at home with his wife.   Patient has three children and his wife has three children.   Patient is working full-time.   Patient has a Ph.D.   Patient is right-handed.   Patient drinks about 21 oz of coffee daily.         Social Drivers of Health   Tobacco Use: Low Risk (10/03/2024)   Patient History    Smoking Tobacco Use: Never    Smokeless Tobacco Use: Never    Passive Exposure: Not on file  Financial Resource Strain: Not on file  Food Insecurity: Not on file  Transportation Needs: Not on file  Physical Activity: Not on file  Stress: Not on file  Social Connections: Not on file   Depression (EYV7-0): Not on file  Alcohol Screen: Not on file  Housing: Not on file  Utilities: Not on file  Health Literacy: Not on file     Family History: The patient's family history includes Emphysema in his mother; Heart attack in his father; High Cholesterol in an other family member; Hypertension in his father and another family member.  ROS:   Please see the history of present illness.     All other systems reviewed and are negative.  EKGs/Labs/Other Studies Reviewed:    The following studies were reviewed today:  Coronary CT 11/01/2019 IMPRESSION: 1. Coronary calcium  score of 25.7. This was 23rd percentile for age and sex matched control.   2. Mild ascending and descending thoracic aortic wall calcifications   3. Normal coronary origin with right dominance.   4.  Minimal non obstructive calcified plaque in the mid RCA and LAD   5. CAD-RADS 1. Minimal non-obstructive CAD (0-24%). Consider non-atherosclerotic causes of chest pain. Consider preventive therapy and risk factor modification.   EKG Interpretation Date/Time:  Wednesday October 03 2024 08:10:33 EST Ventricular Rate:  57 PR Interval:  182 QRS Duration:  106 QT Interval:  446 QTC Calculation: 434 R Axis:   -21  Text Interpretation: Sinus bradycardia  When compared with ECG of 20-Jul-2023 11:40,  Poor R wave progression now present in precordial leads  Confirmed by Ailene Royal [phone]) on 10/03/2024 8:13:50 AM   EKG Interpretation Date/Time:  Wednesday October 03 2024 08:10:33 EST Ventricular Rate:  57 PR Interval:  182 QRS Duration:  106 QT Interval:  446 QTC Calculation: 434 R Axis:   -21  Text Interpretation: Sinus bradycardia  When compared with ECG of 20-Jul-2023 11:40,  Poor R wave progression now present in precordial leads  Confirmed by Lealer Marsland [phone]) on 10/03/2024 8:13:50 AM    Recent Labs: 07/04/2024: ALT 33; BUN 22; Creat 1.17;  Potassium 3.9; Sodium 140  Recent Lipid Panel     Component Value Date/Time   CHOL 146 01/20/2021 0916   TRIG 109 01/20/2021 0916   HDL 53 01/20/2021 0916   CHOLHDL 2.8 01/20/2021 0916   CHOLHDL 3.5 01/27/2017 0809   VLDL 34 (H) 01/27/2017 0809   LDLCALC 73 01/20/2021 0916   Dated 07/17/21: cholesterol 147, triglycerides 130, HDL 50, LDL 71. CMET and TSH normal. Dated 08/09/22: cholesterol 137, triglycerides 93, HDL 56, LDL 62. Normal CMET and TSH Dated 10/18/22: normal CBC Dated 08/10/24: CBC and CMET. Cholesterol 143, triglycerides 131, LDL 64, HDL 53. TSH normal.  Physical Exam:    VS:  BP 122/60 (BP Location: Left Arm, Cuff Size: Normal)   Pulse (!) 57   Ht 5' 9 (1.753 m)   Wt 184 lb 7 oz (83.7 kg)   BMI 27.24 kg/m     Wt Readings from Last 3 Encounters:  10/03/24 184 lb 7 oz (83.7 kg)  08/06/24 173 lb (78.5 kg)  02/14/24 173 lb (78.5 kg)     GEN:  Well nourished, well developed in no acute distress HEENT: Normal NECK: No JVD; No carotid bruits LYMPHATICS: No lymphadenopathy CARDIAC: RRR, no murmurs, rubs, gallops RESPIRATORY:  Clear to auscultation without rales, wheezing or rhonchi  ABDOMEN: Soft, non-tender, non-distended MUSCULOSKELETAL:  No edema; No deformity  SKIN: Warm and dry NEUROLOGIC:  Alert and oriented x 3 PSYCHIATRIC:  Normal affect   ASSESSMENT:    1. Coronary artery disease involving native coronary artery of native heart without angina pectoris   2. Pure hypercholesterolemia   3. Primary hypertension        PLAN:    In order of problems listed above:  Coronary artery calcification: Previous coronary CT showed minimal disease  Hypertension: Blood pressure is well controlled generally but does seem to spike when he travels. We will continue his baseline therapy with Toprol , diltiazem , and losartan . I have given him a Rx for hydralazine  25 mg to use as needed for BP spikes. Follow up in 6 months.  Hyperlipidemia: labs at goal in November   Medication Adjustments/Labs and Tests  Ordered: Current medicines are reviewed at length with the patient today.  Concerns regarding medicines are outlined above.  Orders Placed This Encounter  Procedures   EKG 12-Lead   No orders of the defined types were placed in this encounter.   There are no Patient Instructions on file for this visit.   Signed, Antwanette Wesche, MD  10/03/2024 8:20 AM    [facility] "

## 2024-10-03 ENCOUNTER — Ambulatory Visit: Attending: Cardiology | Admitting: Cardiology

## 2024-10-03 ENCOUNTER — Encounter: Payer: Self-pay | Admitting: Cardiology

## 2024-10-03 VITALS — BP 122/60 | HR 57 | Ht 69.0 in | Wt 184.4 lb

## 2024-10-03 DIAGNOSIS — I2510 Atherosclerotic heart disease of native coronary artery without angina pectoris: Secondary | ICD-10-CM

## 2024-10-03 DIAGNOSIS — I10 Essential (primary) hypertension: Secondary | ICD-10-CM | POA: Diagnosis not present

## 2024-10-03 DIAGNOSIS — E7800 Pure hypercholesterolemia, unspecified: Secondary | ICD-10-CM | POA: Diagnosis not present

## 2024-10-03 NOTE — Progress Notes (Signed)
 SABRA

## 2024-10-03 NOTE — Patient Instructions (Signed)

## 2024-10-04 ENCOUNTER — Ambulatory Visit: Admitting: Neurology

## 2024-10-04 ENCOUNTER — Encounter: Payer: Self-pay | Admitting: Neurology

## 2024-10-04 VITALS — BP 114/62 | HR 56 | Ht 68.0 in | Wt 182.5 lb

## 2024-10-04 DIAGNOSIS — G4733 Obstructive sleep apnea (adult) (pediatric): Secondary | ICD-10-CM | POA: Diagnosis not present

## 2024-10-04 NOTE — Progress Notes (Signed)
 "  Patient: Robert [name] Date of Birth: 1946-06-26  Reason for Visit: Follow up History from: Patient Primary Neurologist: Robert [name]   ASSESSMENT AND PLAN 79 y.o. year old male   OSA on CPAP Insomnia   -Continue CPAP nightly minimum 4 hours. Excellent compliance. He is eligible for a new CPAP  machine. He had HST Sept 2025 to qualify for a new machine showing mild OSA with strong REM sleep dependency. He decided to continue his current CPAP machine, if he would like a new machine, he will let me know. We have refilled his Ativan  1 mg for insomnia. Uses sparingly, do not recommend using nightly, watch for side effects, can be sedating. Follow up in 1 year.   HISTORY OF PRESENT ILLNESS: Today 10/04/2024 Update 10/04/24 SS: We were going to pursue a new cpap machine. HST 06/06/24 mild all obstructive sleep apnea with a strong REM sleep dependency and very strong positional dependency. AHI 11.4/h. He didn't pursue getting a new machine, because he read bad reviews about the new res med machines. He is happy with his res med 10. Download 100% compliance 4-8 cm water , AHI 1.6, leak 23. He is going to call res med to see if new machine has any changes. Let me know if he wants to get a new cpap machine. Dr. Chalice has refilled his Ativan  periodically for insomnia. Last was 08/31/24 # 30, 03/16/24, 12/15/23.  12/08/23 SS: Here for CPAP revisit.  CPAP download shows 97% compliance, AHI 1.9, leak 30.7. 4-8 cm water . Set up August 2020. Using nasal mask with memory foam, noticing some redness to left nose. Trying to loosen some. Has BPH, on a bad night gets up about 5 times a night, best is twice a night. Recently tried sleeping without it on vacation, slept on his side didn't notice much difference. Without it he snores terribly, no way he could sleep in same bed with his wife. At times take nap in afternoon, wife will wake him up snoring. He feels great, is active, goes to gym 3 times a week. Going to [place] in  September, Dr. Chalice has sent in Ativan  historically to help with sleep, last sent in October 2024.  Update 12/08/22 SS: Here today alone for CPAP revisit. Setup date was August 2020. His machine is doing fine. Using nasal mask resmed with memory foam. Review of CPAP download 11/08/2022-12/07/2022 shows 30/30 days greater than 4 hours 29/30 days at 97%.  Average usage 7 hours 16 minutes.  Minimum pressure 4, maximum pressure 8 cmH2O.  EPR level 1.  Leak 25.8.  AHI 3.4. he has some leak of the mask, he thinks he forgets to change the mask because it is so comfortable. He didn't use CPAP last night due to sore on his nose, he was tired today, he took a nap. He has BPH has nocturia. ESS 4.   HISTORY  06/15/22 Dr. Chalice HPI:  Robert [name] is a 79 y.o. male here as a revisit for Insomnia and OSA , first time since his long [place] and [place] trip with wife Robert [name]. He has actually a whole new set of questions.     Mr. Robert [name] reported he  was recently seen for a scratchy throat by ENT Dr. Karis and he suspected  that Mr. Robert [name] has GERD. He had  also recently a new GI MD, dx with Barretts esophagus, a 5 year colonoscopy and  endoscopy was scheduled. Also a CT of the lower abdomen and pelvis:  1. Small fat containing left inguinal hernia and tiny fat containing right inguinal hernia. 2. Prominent mesenteric lymph nodes, unchanged compared to prior CT. 3.  Aortic Atherosclerosis (ICD10-I70.0).      Has Neuropathy in his feet:  Dr. Karis placed the patient on Omeprazole and gabapentin, and GI changed to pantoprazole.  Gabapentin was  used to calm  throat nerves' and he didn't feel that was achieved but the general neuropathy he has is better- takes 600 mg a day now.  Cutting down on acidic foods,,caffeine and alcohol.     The OSA on CPAP is well controlled.   REVIEW OF SYSTEMS: Out of a complete 14 system review of symptoms, the patient complains only of the following symptoms, and all other reviewed  systems are negative.  See HPI  ALLERGIES: Allergies  Allergen Reactions   Betadine [Povidone Iodine] Other (See Comments)    Causes stinging of skin    Oxycodone  Other (See Comments)    nightmares   Povidone-Iodine     Other Reaction(s): Not available  povidone-iodine    HOME MEDICATIONS: Outpatient Medications Prior to Visit  Medication Sig Dispense Refill   acetaminophen  (TYLENOL ) 500 MG tablet Take 500 mg by mouth at bedtime.     atorvastatin  (LIPITOR) 40 MG tablet TAKE 1 TABLET BY MOUTH EVERY DAY 90 tablet 2   B Complex Vitamins (VITAMIN B COMPLEX PO) 1 tablet Orally once a day     cholecalciferol (VITAMIN D3) 25 MCG (1000 UT) tablet Take 1,000 Units by mouth daily.     CIALIS  5 MG tablet Take 5 mg by mouth daily at 3 pm.  (Patient taking differently: Take 7.5 mg by mouth daily in the afternoon.)     diltiazem  (CARDIZEM  CD) 180 MG 24 hr capsule TAKE 1 CAPSULE (180 MG TOTAL) BY MOUTH AT BEDTIME. 90 capsule 2   doxylamine, Sleep, (UNISOM) 25 MG tablet Take 25 mg by mouth at bedtime as needed for sleep.     ezetimibe  (ZETIA ) 10 MG tablet Take 1 tablet (10 mg total) by mouth daily. 90 tablet 3   famotidine (PEPCID AC) 10 MG tablet Take 10 mg at bedtime     Ferrous Sulfate (IRON) 28 MG TABS Take 1 tablet by mouth in the morning.     fexofenadine (ALLEGRA) 180 MG tablet Take 180 mg by mouth daily.     fluticasone (FLONASE) 50 MCG/ACT nasal spray Place 1 spray into both nostrils daily.      hydrochlorothiazide  (MICROZIDE ) 12.5 MG capsule Take 1 capsule (12.5 mg total) by mouth daily. 90 capsule 3   iron polysaccharides (NIFEREX) 150 MG capsule Take 150 mg by mouth daily.     lidocaine  (LMX) 4 % cream Apply 1 application topically at bedtime as needed (foot pain).     LORazepam  (ATIVAN ) 1 MG tablet Take 0.5 tablets (0.5 mg total) by mouth 2 (two) times daily. 30 tablet 0   losartan  (COZAAR ) 50 MG tablet TAKE 2 TABLETS BY MOUTH DAILY 180 tablet 3   melatonin 5 MG TABS Take 5 mg by  mouth at bedtime.     metoprolol  succinate (TOPROL  XL) 25 MG 24 hr tablet Take 1&1/2 tablets daily     metoprolol  succinate (TOPROL -XL) 25 MG 24 hr tablet TAKE 2 TABLETS BY MOUTH EVERY DAY 180 tablet 2   Multiple Vitamin (MULTIVITAMIN WITH MINERALS) TABS tablet Take 1 tablet by mouth daily.     NON FORMULARY CPAP MACHINE     pantoprazole (PROTONIX) 40 MG  tablet Take 40 mg by mouth every morning.     Probiotic Product (PROBIOTIC DAILY PO) Take 1 capsule by mouth daily.     Tadalafil  (CIALIS ) 2.5 MG TABS Take 2.5 mg daily     tadalafil  (CIALIS ) 5 MG tablet Take 5 mg daily     terbinafine  (LAMISIL ) 250 MG tablet Take 1 tablet (250 mg total) by mouth daily. 30 tablet 0   No facility-administered medications prior to visit.    PAST MEDICAL HISTORY: Past Medical History:  Diagnosis Date   Angina pectoris, unspecified 07/03/2024   Anxiety    Arthritis    Knee , Neck   Colon cancer (HCC)    a. s/p colon resection in 2004 and 2002   Complication of anesthesia    difficulty urinating after   Edema of right foot    ? 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 following abnormal stress tests. Both reportedly normal;  b. 02/2013 nl myoview.   Sleep apnea with use of continuous positive airway pressure (CPAP)    wears CPAP nightly    PAST SURGICAL HISTORY: Past Surgical History:  Procedure Laterality Date   CALCANEAL OSTEOTOMY Right 04/29/2016   Procedure: RIGHT CALCANEAL OSTEOTOMY, RIGHT MEDIAL CUNEIFORM OSTEOTOMY;  Surgeon: Norleen Armor, MD;  Location: [facility];  Service: Orthopedics;  Laterality: Right;   CARDIAC CATHETERIZATION  07/04/1997   COLON RESECTION  2004   x 2 per patient    COLON  SURGERY     colon resection   COLONOSCOPY     ELBOW SURGERY Left 2011   INFECTED OLECRANON BURSITIS SURGERY   FOOT SURGERY Right    GASTROCNEMIUS RECESSION Right 04/29/2016   Procedure: RIGHT GASTROC RECESSION;  Surgeon: Norleen Armor, MD;  Location: [facility];  Service: Orthopedics;  Laterality: Right;   INGUINAL HERNIA REPAIR Right 06/03/2015   Procedure: REPAIR RIGHT INGUINAL HERNIA;  Surgeon: Dann Hummer, MD;  Location[facility];  Service: General;  Laterality: Right;   INSERTION OF MESH Right 06/03/2015   Procedure: INSERTION OF MESH;  Surgeon: Dann Hummer, MD;  Location: [facility] OR;  Service: General;  Laterality: Right;   JOINT REPLACEMENT Left    L4 DISCECTOMY     PARATHYROIDECTOMY Left 03/16/2019   Procedure: LEFT PARATHYROIDECTOMY;  Surgeon: Eletha Boas, MD;  Location: [facility];  Service: General;  Laterality: Left;   RETINAL DETACHMENT SURGERY  1998   SEPTIC JOINT Left    Elbow   TOTAL KNEE ARTHROPLASTY Left 01/06/2015   Procedure: LEFT TOTAL KNEE ARTHROPLASTY;  Surgeon: Dempsey Moan, MD;  Location: [facility];  Service: Orthopedics;  Laterality: Left;   TOTAL KNEE ARTHROPLASTY Right 10/03/2015   Procedure: RIGHT TOTAL KNEE ARTHROPLASTY;  Surgeon: Dempsey Moan, MD;  Location: [facility];  Service: Orthopedics;  Laterality: Right;   TRANSURETHRAL RESECTION OF PROSTATE      FAMILY HISTORY: Family History  Problem Relation Age of Onset   Emphysema Mother    Heart attack Father    Hypertension Father    Hypertension Other    High Cholesterol Other     SOCIAL HISTORY: Social History   Socioeconomic History   Marital status: Married  Spouse name: Robert [name]   Number of children: 3   Years of education: Ph.D   Highest education level: Not on file  Occupational History   Occupation: ADMIN    Employer: [employer]    Comment: [employer]  Tobacco Use   Smoking status: Never   Smokeless tobacco: Never  Vaping Use   Vaping status: Never Used  Substance and  Sexual Activity   Alcohol use: Yes    Alcohol/week: 17.0 standard drinks of alcohol    Types: 8 Glasses of wine, 8 Shots of liquor, 1 Standard drinks or equivalent per week    Comment: each night 1 glass   Drug use: No   Sexual activity: Yes  Other Topics Concern   Not on file  Social History Narrative   Sleep apnea 28 AHI was reduced to 3.4- set to  7 cm water  pressure on nasal mask eson or wisp.  He sleep  nonsupine, less nocturia.  Tennis ball method explained.  Patient does not want machine set to a certain pressure.     Downloaded CMS compliance s etsablsihed .  Insomnia not improved further - will try seroquel as  medication approach and discussed behaviour therapy approach .   send for n beahiour therapy and started trial of SEROQUEL>  25 minute visit including CMS compliance and downloading.    Patient is married Jeremiah) and lives at home with his wife.   Patient has three children and his wife has three children.   Patient is working full-time.   Patient has a Ph.D.   Patient is right-handed.   Patient drinks about 21 oz of coffee daily.         Social Drivers of Health   Tobacco Use: Low Risk (10/04/2024)   Patient History    Smoking Tobacco Use: Never    Smokeless Tobacco Use: Never    Passive Exposure: Not on file  Financial Resource Strain: Not on file  Food Insecurity: Not on file  Transportation Needs: Not on file  Physical Activity: Not on file  Stress: Not on file  Social Connections: Not on file  Intimate Partner Violence: Not on file  Depression (EYV7-0): Not on file  Alcohol Screen: Not on file  Housing: Not on file  Utilities: Not on file  Health Literacy: Not on file   PHYSICAL EXAM  Vitals:   10/04/24 1243  BP: 114/62  Pulse: (!) 56  SpO2: 98%  Weight: 182 lb 8 oz (82.8 kg)  Height: 5' 8 (1.727 m)   Body mass index is 27.75 kg/m.  Generalized: Well developed, in no acute distress  Neurological examination  Mentation: Alert oriented to  time, place, history taking. Follows all commands speech and language fluent Cranial nerve II-XII: Pupils were equal round reactive to light.  Motor: Moves all extremities independent Gait and station: Gait is normal.   DIAGNOSTIC DATA (LABS, IMAGING, TESTING) - I reviewed patient records, labs, notes, testing and imaging myself where available.  Lab Results  Component Value Date   WBC 7.5 03/03/2021   HGB 15.8 03/03/2021   HCT 47.9 03/03/2021   MCV 89.4 03/03/2021   PLT 193 03/03/2021      Component Value Date/Time   NA 140 07/04/2024 1428   NA 140 01/20/2021 0915   K 3.9 07/04/2024 1428   CL 103 07/04/2024 1428   CO2 27 07/04/2024 1428   GLUCOSE 127 07/04/2024 1428   BUN 22 07/04/2024 1428   BUN 24 01/20/2021 0915  CREATININE 1.17 07/04/2024 1428   CALCIUM  9.6 07/04/2024 1428   PROT 6.8 07/04/2024 1428   PROT 6.3 01/20/2021 0915   ALBUMIN 4.8 03/03/2021 2004   ALBUMIN 4.4 01/20/2021 0915   AST 24 07/04/2024 1428   ALT 33 07/04/2024 1428   ALKPHOS 72 03/03/2021 2004   BILITOT 0.6 07/04/2024 1428   BILITOT 0.5 01/20/2021 0915   GFRNONAA >60 03/03/2021 2004   GFRAA 81 11/27/2019 0822   Lab Results  Component Value Date   CHOL 146 01/20/2021   HDL 53 01/20/2021   LDLCALC 73 01/20/2021   TRIG 109 01/20/2021   CHOLHDL 2.8 01/20/2021   No results found for: HGBA1C No results found for: [id number] Lab Results  Component Value Date   TSH 1.13 10/23/2013    Lauraine Born, AGNP-C, DNP 10/04/2024, 1:09 PM [facility] [address] [place], [place] [address] [phone]   "

## 2024-10-04 NOTE — Patient Instructions (Signed)
 Great to see you today! Continue CPAP use nightly minimum 4 hours Let me know if you would like a new machine Follow-up in 1 year.  Thanks!!

## 2024-12-11 ENCOUNTER — Ambulatory Visit: Admitting: Neurology

## 2024-12-20 ENCOUNTER — Ambulatory Visit: Admitting: Podiatry

## 2025-10-08 ENCOUNTER — Ambulatory Visit: Admitting: Neurology
